# Patient Record
Sex: Male | Born: 1935
Health system: Southern US, Community
[De-identification: ages and names within clinical notes are randomized; demographics above are authoritative.]

## PROBLEM LIST (undated history)

## (undated) DIAGNOSIS — C801 Malignant (primary) neoplasm, unspecified: Secondary | ICD-10-CM

## (undated) DIAGNOSIS — K219 Gastro-esophageal reflux disease without esophagitis: Secondary | ICD-10-CM

## (undated) DIAGNOSIS — I4891 Unspecified atrial fibrillation: Secondary | ICD-10-CM

## (undated) DIAGNOSIS — Z8546 Personal history of malignant neoplasm of prostate: Secondary | ICD-10-CM

## (undated) DIAGNOSIS — R634 Abnormal weight loss: Secondary | ICD-10-CM

## (undated) DIAGNOSIS — I2510 Atherosclerotic heart disease of native coronary artery without angina pectoris: Secondary | ICD-10-CM

## (undated) DIAGNOSIS — E782 Mixed hyperlipidemia: Secondary | ICD-10-CM

## (undated) DIAGNOSIS — R066 Hiccough: Secondary | ICD-10-CM

## (undated) HISTORY — DX: Personal history of malignant neoplasm of prostate: Z85.46

## (undated) HISTORY — DX: Mixed hyperlipidemia: E78.2

## (undated) HISTORY — DX: Hiccough: R06.6

## (undated) HISTORY — PX: ESOPHAGOGASTRODUODENOSCOPY: SHX1529

## (undated) HISTORY — DX: Gastro-esophageal reflux disease without esophagitis: K21.9

## (undated) HISTORY — DX: Abnormal weight loss: R63.4

## (undated) HISTORY — DX: Unspecified atrial fibrillation: I48.91

## (undated) HISTORY — DX: Atherosclerotic heart disease of native coronary artery without angina pectoris: I25.10

## (undated) HISTORY — PX: OTHER SURGICAL HISTORY: SHX169

---

## 1994-10-29 HISTORY — PX: CORONARY ARTERY BYPASS GRAFT: SHX141

## 2003-01-01 ENCOUNTER — Other Ambulatory Visit: Admission: RE | Admit: 2003-01-01 | Discharge: 2003-01-01 | Payer: Self-pay | Admitting: Urology

## 2003-05-03 ENCOUNTER — Ambulatory Visit (HOSPITAL_COMMUNITY): Admission: RE | Admit: 2003-05-03 | Discharge: 2003-05-03 | Payer: Self-pay | Admitting: Family Medicine

## 2003-05-03 ENCOUNTER — Encounter: Payer: Self-pay | Admitting: Family Medicine

## 2003-05-20 ENCOUNTER — Ambulatory Visit (HOSPITAL_COMMUNITY): Admission: RE | Admit: 2003-05-20 | Discharge: 2003-05-20 | Payer: Self-pay | Admitting: Family Medicine

## 2003-05-20 ENCOUNTER — Encounter: Payer: Self-pay | Admitting: Family Medicine

## 2003-09-20 ENCOUNTER — Other Ambulatory Visit: Admission: RE | Admit: 2003-09-20 | Discharge: 2003-09-20 | Payer: Self-pay | Admitting: Urology

## 2003-10-21 ENCOUNTER — Ambulatory Visit (HOSPITAL_COMMUNITY): Admission: RE | Admit: 2003-10-21 | Discharge: 2003-10-21 | Payer: Self-pay | Admitting: Internal Medicine

## 2004-02-10 ENCOUNTER — Ambulatory Visit (HOSPITAL_COMMUNITY): Admission: RE | Admit: 2004-02-10 | Discharge: 2004-02-10 | Payer: Self-pay | Admitting: Family Medicine

## 2004-05-08 ENCOUNTER — Ambulatory Visit (HOSPITAL_COMMUNITY): Admission: RE | Admit: 2004-05-08 | Discharge: 2004-05-08 | Payer: Self-pay | Admitting: Family Medicine

## 2004-06-05 ENCOUNTER — Ambulatory Visit (HOSPITAL_COMMUNITY): Admission: RE | Admit: 2004-06-05 | Discharge: 2004-06-05 | Payer: Self-pay | Admitting: Internal Medicine

## 2004-11-03 ENCOUNTER — Ambulatory Visit (HOSPITAL_COMMUNITY): Admission: RE | Admit: 2004-11-03 | Discharge: 2004-11-03 | Payer: Self-pay | Admitting: Family Medicine

## 2005-01-18 ENCOUNTER — Ambulatory Visit (HOSPITAL_COMMUNITY): Admission: RE | Admit: 2005-01-18 | Discharge: 2005-01-18 | Payer: Self-pay | Admitting: Family Medicine

## 2005-05-23 ENCOUNTER — Ambulatory Visit (HOSPITAL_COMMUNITY): Admission: RE | Admit: 2005-05-23 | Discharge: 2005-05-23 | Payer: Self-pay | Admitting: Family Medicine

## 2005-06-12 ENCOUNTER — Ambulatory Visit: Payer: Self-pay | Admitting: Internal Medicine

## 2005-07-03 ENCOUNTER — Other Ambulatory Visit: Admission: RE | Admit: 2005-07-03 | Discharge: 2005-07-03 | Payer: Self-pay | Admitting: Urology

## 2005-09-05 ENCOUNTER — Encounter (HOSPITAL_COMMUNITY): Admission: RE | Admit: 2005-09-05 | Discharge: 2005-10-05 | Payer: Self-pay | Admitting: *Deleted

## 2005-09-11 ENCOUNTER — Ambulatory Visit (HOSPITAL_COMMUNITY): Admission: RE | Admit: 2005-09-11 | Discharge: 2005-09-11 | Payer: Self-pay | Admitting: Radiation Oncology

## 2005-10-26 ENCOUNTER — Ambulatory Visit: Admission: RE | Admit: 2005-10-26 | Discharge: 2006-02-15 | Payer: Self-pay | Admitting: Radiation Oncology

## 2005-11-13 ENCOUNTER — Ambulatory Visit (HOSPITAL_COMMUNITY): Admission: RE | Admit: 2005-11-13 | Discharge: 2005-11-13 | Payer: Self-pay | Admitting: Family Medicine

## 2006-04-03 ENCOUNTER — Ambulatory Visit: Payer: Self-pay | Admitting: Cardiology

## 2006-04-05 ENCOUNTER — Ambulatory Visit: Payer: Self-pay

## 2006-04-09 ENCOUNTER — Ambulatory Visit (HOSPITAL_COMMUNITY): Admission: RE | Admit: 2006-04-09 | Discharge: 2006-04-09 | Payer: Self-pay | Admitting: Ophthalmology

## 2006-04-19 ENCOUNTER — Ambulatory Visit: Payer: Self-pay | Admitting: Cardiology

## 2006-04-23 ENCOUNTER — Ambulatory Visit (HOSPITAL_COMMUNITY): Admission: RE | Admit: 2006-04-23 | Discharge: 2006-04-23 | Payer: Self-pay | Admitting: Ophthalmology

## 2006-05-13 ENCOUNTER — Ambulatory Visit: Payer: Self-pay | Admitting: Internal Medicine

## 2006-05-30 ENCOUNTER — Ambulatory Visit: Payer: Self-pay

## 2006-05-30 ENCOUNTER — Encounter: Payer: Self-pay | Admitting: Cardiology

## 2006-06-14 ENCOUNTER — Ambulatory Visit: Payer: Self-pay | Admitting: Internal Medicine

## 2006-06-14 ENCOUNTER — Ambulatory Visit (HOSPITAL_COMMUNITY): Admission: RE | Admit: 2006-06-14 | Discharge: 2006-06-14 | Payer: Self-pay | Admitting: Internal Medicine

## 2006-07-11 ENCOUNTER — Ambulatory Visit: Payer: Self-pay | Admitting: Internal Medicine

## 2006-11-20 ENCOUNTER — Ambulatory Visit (HOSPITAL_COMMUNITY): Admission: RE | Admit: 2006-11-20 | Discharge: 2006-11-20 | Payer: Self-pay | Admitting: Family Medicine

## 2006-12-23 ENCOUNTER — Ambulatory Visit (HOSPITAL_COMMUNITY): Admission: RE | Admit: 2006-12-23 | Discharge: 2006-12-23 | Payer: Self-pay | Admitting: Family Medicine

## 2007-01-30 ENCOUNTER — Ambulatory Visit: Payer: Self-pay | Admitting: Internal Medicine

## 2007-03-04 ENCOUNTER — Ambulatory Visit: Payer: Self-pay | Admitting: Cardiology

## 2007-03-10 ENCOUNTER — Ambulatory Visit (HOSPITAL_COMMUNITY): Admission: RE | Admit: 2007-03-10 | Discharge: 2007-03-10 | Payer: Self-pay | Admitting: Cardiology

## 2007-03-14 ENCOUNTER — Inpatient Hospital Stay (HOSPITAL_BASED_OUTPATIENT_CLINIC_OR_DEPARTMENT_OTHER): Admission: RE | Admit: 2007-03-14 | Discharge: 2007-03-14 | Payer: Self-pay | Admitting: Cardiology

## 2007-03-14 ENCOUNTER — Ambulatory Visit: Payer: Self-pay | Admitting: Cardiology

## 2007-03-26 ENCOUNTER — Ambulatory Visit: Payer: Self-pay | Admitting: Cardiology

## 2007-04-08 ENCOUNTER — Ambulatory Visit (HOSPITAL_COMMUNITY): Admission: RE | Admit: 2007-04-08 | Discharge: 2007-04-08 | Payer: Self-pay | Admitting: Ophthalmology

## 2007-04-22 ENCOUNTER — Ambulatory Visit (HOSPITAL_COMMUNITY): Admission: RE | Admit: 2007-04-22 | Discharge: 2007-04-22 | Payer: Self-pay | Admitting: Ophthalmology

## 2007-06-12 ENCOUNTER — Ambulatory Visit: Payer: Self-pay | Admitting: Cardiology

## 2007-12-02 ENCOUNTER — Ambulatory Visit: Payer: Self-pay | Admitting: Cardiology

## 2008-03-01 ENCOUNTER — Ambulatory Visit: Payer: Self-pay

## 2008-03-01 ENCOUNTER — Encounter: Payer: Self-pay | Admitting: Cardiology

## 2008-06-08 ENCOUNTER — Ambulatory Visit (HOSPITAL_COMMUNITY): Admission: RE | Admit: 2008-06-08 | Discharge: 2008-06-08 | Payer: Self-pay | Admitting: Family Medicine

## 2008-06-22 ENCOUNTER — Ambulatory Visit: Payer: Self-pay | Admitting: Cardiology

## 2008-06-24 ENCOUNTER — Ambulatory Visit (HOSPITAL_COMMUNITY): Admission: RE | Admit: 2008-06-24 | Discharge: 2008-06-24 | Payer: Self-pay | Admitting: Family Medicine

## 2008-06-30 ENCOUNTER — Ambulatory Visit: Payer: Self-pay

## 2008-12-20 ENCOUNTER — Ambulatory Visit: Payer: Self-pay | Admitting: Cardiology

## 2009-07-20 DIAGNOSIS — K219 Gastro-esophageal reflux disease without esophagitis: Secondary | ICD-10-CM | POA: Insufficient documentation

## 2009-07-20 DIAGNOSIS — I2510 Atherosclerotic heart disease of native coronary artery without angina pectoris: Secondary | ICD-10-CM | POA: Insufficient documentation

## 2009-07-20 DIAGNOSIS — Z8546 Personal history of malignant neoplasm of prostate: Secondary | ICD-10-CM

## 2009-07-20 DIAGNOSIS — D649 Anemia, unspecified: Secondary | ICD-10-CM | POA: Insufficient documentation

## 2009-07-20 DIAGNOSIS — E782 Mixed hyperlipidemia: Secondary | ICD-10-CM

## 2009-07-20 DIAGNOSIS — R42 Dizziness and giddiness: Secondary | ICD-10-CM | POA: Insufficient documentation

## 2009-07-20 DIAGNOSIS — I4891 Unspecified atrial fibrillation: Secondary | ICD-10-CM | POA: Insufficient documentation

## 2009-07-27 ENCOUNTER — Ambulatory Visit: Payer: Self-pay | Admitting: Cardiology

## 2010-02-06 ENCOUNTER — Ambulatory Visit: Payer: Self-pay | Admitting: Cardiology

## 2010-02-09 ENCOUNTER — Telehealth: Payer: Self-pay | Admitting: Cardiology

## 2010-04-07 ENCOUNTER — Ambulatory Visit (HOSPITAL_COMMUNITY): Admission: RE | Admit: 2010-04-07 | Discharge: 2010-04-07 | Payer: Self-pay | Admitting: Family Medicine

## 2010-04-19 ENCOUNTER — Encounter: Admission: RE | Admit: 2010-04-19 | Discharge: 2010-04-19 | Payer: Self-pay | Admitting: Family Medicine

## 2010-05-09 ENCOUNTER — Ambulatory Visit: Payer: Self-pay | Admitting: Cardiology

## 2010-05-09 DIAGNOSIS — I2510 Atherosclerotic heart disease of native coronary artery without angina pectoris: Secondary | ICD-10-CM

## 2010-06-29 HISTORY — PX: UPPER GASTROINTESTINAL ENDOSCOPY: SHX188

## 2010-06-29 HISTORY — PX: COLONOSCOPY: SHX174

## 2010-10-04 ENCOUNTER — Ambulatory Visit: Payer: Self-pay | Admitting: Internal Medicine

## 2010-11-03 ENCOUNTER — Ambulatory Visit: Admit: 2010-11-03 | Payer: Self-pay | Admitting: Cardiology

## 2010-11-13 ENCOUNTER — Ambulatory Visit
Admission: RE | Admit: 2010-11-13 | Discharge: 2010-11-13 | Payer: Self-pay | Source: Home / Self Care | Attending: Internal Medicine | Admitting: Internal Medicine

## 2010-11-13 LAB — HEPATIC FUNCTION PANEL
ALT: 16 U/L (ref 10–40)
AST: 19 U/L (ref 14–40)
Bilirubin, Total: 0.6 mg/dL

## 2010-11-13 LAB — CBC AND DIFFERENTIAL
HCT: 41 % (ref 41–53)
Platelets: 193 10*3/uL (ref 150–399)

## 2010-11-30 NOTE — Assessment & Plan Note (Signed)
Summary: Sergio Boyle   Visit Type:  6 months follow up  CC:  Weakness.  History of Present Illness: He thinks he is getting weaker.  Not enough strength in the legs.  He feels like he wants to lay down and go to sleep.   He feels like his head in the fog, at times.    Current Medications (verified): 1)  Coumadin 5 Mg Tabs (Warfarin Sodium) .... Take As Directed 2)  Lipitor 20 Mg Tabs (Atorvastatin Calcium) .... Take One Tablet By Mouth Daily. 3)  Xanax 0.5 Mg Tabs (Alprazolam) .... Take 1 Tablet By Mouth Two Times A Day 4)  Nitrostat 0.4 Mg Subl (Nitroglycerin) .... Take As Directed For Chest Pain 5)  Vicodin 5-500 Mg Tabs (Hydrocodone-Acetaminophen) .... As Needed 6)  Prilosec Otc 20 Mg Tbec (Omeprazole Magnesium) .... Take 1 Tablet By Mouth Once A Day 7)  Cardizem 60 Mg Tabs (Diltiazem Hcl) .... Take 1 Tablet By Mouth Once A Day  Allergies: 1)  ! Sulfa  Vital Signs:  Patient profile:   77 year old male Height:      69 inches Weight:      176 pounds BMI:     26.08 Pulse rate:   105 / minute Pulse rhythm:   irregular Resp:     18 per minute BP sitting:   118 / 70  (left arm) Cuff size:   regular  Vitals Entered By: Vikki Ports (February 06, 2010 3:13 PM)  Physical Exam  General:  Well developed, well nourished, in no acute distress. Head:  normocephalic and atraumatic Eyes:  PERRLA/EOM intact; conjunctiva and lids normal. Lungs:  Clear bilaterally to auscultation and percussion. Heart:  Irregularly, irregular.   Abdomen:  Bowel sounds positive; abdomen soft and non-tender without masses, organomegaly, or hernias noted. No hepatosplenomegaly. Pulses:  pulses normal in all 4 extremities Neurologic:  Alert and oriented x 3.  Non focal.   EKG  Procedure date:  02/06/2010  Findings:      irregularly, irregular rate 95, RBBB.  Impression & Recommendations:  Problem # 1:  ATRIAL FIBRILLATION, CHRONIC (ICD-427.31) Persistent.  Rate is marginally controlled, but rate go to  slow on doses of diltiazem..  Might be better with 120mg  daily.  He will think about it.  Has also been stable on warfarin, with CHADS of 1, but MRI is as noted.  Might consider alternative.   His updated medication list for this problem includes:    Coumadin 5 Mg Tabs (Warfarin sodium) .Marland Kitchen... Take as directed  Problem # 2:  CAD (ICD-414.00)  stable His updated medication list for this problem includes:    Coumadin 5 Mg Tabs (Warfarin sodium) .Marland Kitchen... Take as directed    Nitrostat 0.4 Mg Subl (Nitroglycerin) .Marland Kitchen... Take as directed for chest pain    Dilt-cd 120 Mg Xr24h-cap (Diltiazem hcl coated beads) .Marland Kitchen... Take one capsule daily  Orders: EKG w/ Interpretation (93000)  Problem # 3:  HYPERCHOLESTEROLEMIA (ICD-272.0) tolerating well. His updated medication list for this problem includes:    Lipitor 20 Mg Tabs (Atorvastatin calcium) .Marland Kitchen... Take one tablet by mouth daily.  Problem # 4:  DIZZINESS (ICD-780.4) chronic issue.  See MRI, warfarin discussed favor 2-2.5 range.  Patient Instructions: 1)  Your physician recommends that you schedule a follow-up appointment in: 3 months 2)  Your physician has recommended you make the following change in your medication: Diltiazem 120mg  one capsule daily. Prescriptions: DILT-CD 120 MG XR24H-CAP (DILTIAZEM HCL COATED BEADS) Take one capsule daily  #  30 x 6   Entered by:   Lisabeth Devoid RN   Authorized by:   Ronaldo Miyamoto, MD, [facility]   Signed by:   Lisabeth Devoid RN on 02/06/2010   Method used:   Electronically to        [facility] (retail)       [address]       [place], [place]  [address]       Ph: [phone] or [phone]       Fax: [phone]   RxID:   [id number]

## 2010-11-30 NOTE — Assessment & Plan Note (Signed)
Summary: f23m   Visit Type:  3 months follow up  CC:  irregular heart beat.  History of Present Illness: Stable overall.  Does not do much.  Dr. Renard Matter has encouraged him to see a neurologist.  Tolerates the dig better than he does beta blocker.  Still has lightheadedness.  No chest pain.   Current Medications (verified): 1)  Coumadin 5 Mg Tabs (Warfarin Sodium) .... Take As Directed 2)  Lipitor 20 Mg Tabs (Atorvastatin Calcium) .... Take One Tablet By Mouth Daily. 3)  Xanax 0.5 Mg Tabs (Alprazolam) .... Take 1 Tablet By Mouth Two Times A Day 4)  Nitrostat 0.4 Mg Subl (Nitroglycerin) .... Take As Directed For Chest Pain 5)  Vicodin 5-500 Mg Tabs (Hydrocodone-Acetaminophen) .... As Needed 6)  Protonix 40 Mg Tbec (Pantoprazole Sodium) .... Take 1 Tablet By Mouth Once A Day 7)  Digoxin 0.125 Mg Tabs (Digoxin) .... Take One Tablet By Mouth Daily  Allergies: 1)  ! Sulfa  Vital Signs:  Patient profile:   77 year old male Height:      69 inches Weight:      170.50 pounds BMI:     25.27 Pulse rate:   74 / minute Pulse rhythm:   irregular Resp:     18 per minute BP sitting:   100 / 58  (left arm) Cuff size:   regular  Vitals Entered By: Vikki Ports (May 09, 2010 3:29 PM)  Physical Exam  General:  Well developed, well nourished, in no acute distress. Head:  normocephalic and atraumatic Eyes:  PERRLA/EOM intact; conjunctiva and lids normal. Lungs:  Clear bilaterally to auscultation and percussion. Heart:  PMI non displaced.  Irregularly irregular rhythm, not too fast. No definite murmur.  Abdomen:  Bowel sounds positive; abdomen soft and non-tender without masses, organomegaly, or hernias noted. No hepatosplenomegaly. Extremities:  No clubbing or cyanosis.  No edema.    EKG  Procedure date:  05/09/2010  Findings:      atrial fib, controlled vent response.   EKG  Procedure date:  05/09/2010  Findings:      atrial fib. RBBB.  Controlled vent response.   Impression &  Recommendations:  Problem # 1:  ATRIAL FIBRILLATION, CHRONIC (ICD-427.31)  Rate is reasonable on dig alone,which he tolerates better than Calcium blockers.  Does not do much.  Symptoms, however, are controlled.   ChadsVASC2 score is 3, with estimated stroke rate of 3.2%.  Has abnormal MRI, with small hemorrhage, old and stable.  Favor continuiung.  His updated medication list for this problem includes:    Coumadin 5 Mg Tabs (Warfarin sodium) .Marland Kitchen... Take as directed    Digoxin 0.125 Mg Tabs (Digoxin) .Marland Kitchen... Take one tablet by mouth daily  His updated medication list for this problem includes:    Coumadin 5 Mg Tabs (Warfarin sodium) .Marland Kitchen... Take as directed    Digoxin 0.125 Mg Tabs (Digoxin) .Marland Kitchen... Take one tablet by mouth daily  Orders: EKG w/ Interpretation (93000)  Problem # 2:  CAD, ARTERY BYPASS GRAFT (ICD-414.04)  reamains without chest pain, and stable status.   The following medications were removed from the medication list:    Diltiazem Hcl Cr 60 Mg Xr12h-cap (Diltiazem hcl) .Marland Kitchen... Take one capsule daily His updated medication list for this problem includes:    Coumadin 5 Mg Tabs (Warfarin sodium) .Marland Kitchen... Take as directed    Nitrostat 0.4 Mg Subl (Nitroglycerin) .Marland Kitchen... Take as directed for chest pain  The following medications were removed from the medication  list:    Diltiazem Hcl Cr 60 Mg Xr12h-cap (Diltiazem hcl) .Marland Kitchen... Take one capsule daily His updated medication list for this problem includes:    Coumadin 5 Mg Tabs (Warfarin sodium) .Marland Kitchen... Take as directed    Nitrostat 0.4 Mg Subl (Nitroglycerin) .Marland Kitchen... Take as directed for chest pain  Problem # 3:  HYPERCHOLESTEROLEMIA (ICD-272.0)  Managed by Dr. Renard Matter.  Continue atorvastatin. His updated medication list for this problem includes:    Lipitor 20 Mg Tabs (Atorvastatin calcium) .Marland Kitchen... Take one tablet by mouth daily.  His updated medication list for this problem includes:    Lipitor 20 Mg Tabs (Atorvastatin calcium) .Marland Kitchen... Take  one tablet by mouth daily.  Orders: EKG w/ Interpretation (93000)  Patient Instructions: 1)  Your physician wants you to follow-up in:   6 MONTHS. You will receive a reminder letter in the mail two months in advance. If you don't receive a letter, please call our office to schedule the follow-up appointment. 2)  Your physician recommends that you continue on your current medications as directed. Please refer to the Current Medication list given to you today.

## 2010-11-30 NOTE — Progress Notes (Signed)
Summary: meds/low bp  Phone Note Call from Patient Call back at Home Phone [phone]   Caller: Patient Reason for Call: Talk to Nurse Summary of Call: request to speak to nurse about meds, bp is low 100/50 Initial call taken by: Migdalia Dk,  February 09, 2010 10:06 AM  Follow-up for Phone Call        I spoke with the pt and he took his first dose of Diltiazem 120mg  on Wednesday.  This dropped his BP to 90/40 and he did not feel well all day.  Today the pt opened the 120mg  capsule and took part of the beads.  The pt's pulse is in the 90s, BP 100/50.  I instructed the pt not to open the capsules.  I also instructed the pt to go back to his previous 60mg  dose.  I will review this information with Dr Riley Kill for further orders.  Follow-up by: Julieta Gutting, RN, BSN,  February 09, 2010 10:59 AM  Additional Follow-up for Phone Call Additional follow up Details #1::        Per Dr Riley Kill the pt can start Lanoxin 0.125mg  and continue Diltiazem 60mg  daily.  I will call the pt on 02/13/10. Julieta Gutting, RN, BSN  February 10, 2010 7:20 PM  I spoke with the pt and he said he has taken Lanoxin in the past and did not tolerate this medication due to stomach problems.  I will review this information with Dr Riley Kill.  Additional Follow-up by: Julieta Gutting, RN, BSN,  February 13, 2010 9:24 AM    Additional Follow-up for Phone Call Additional follow up Details #2::    The pt called back and said he has IBS that is treated with Xanax.  The pt said it has been over 20 years since he has taken Lanoxin and would like to try this medication again.  I will send a Rx to [facility]. Julieta Gutting, RN, BSN  February 13, 2010 10:14 AM  New/Updated Medications: DILTIAZEM HCL CR 60 MG XR12H-CAP (DILTIAZEM HCL) take one capsule daily DIGOXIN 0.125 MG TABS (DIGOXIN) take one tablet by mouth daily Prescriptions: DIGOXIN 0.125 MG TABS (DIGOXIN) take one tablet by mouth daily  #30 x 6   Entered by:   Julieta Gutting,  RN, BSN   Authorized by:   Ronaldo Miyamoto, MD, [facility]   Signed by:   Julieta Gutting, RN, BSN on 02/13/2010   Method used:   Electronically to        [facility] (retail)       [address]       [place], [place]  [address]       Ph: [phone] or [phone]       Fax: [phone]   RxID:   [id number]

## 2010-12-26 ENCOUNTER — Encounter: Payer: Self-pay | Admitting: Cardiology

## 2010-12-26 ENCOUNTER — Ambulatory Visit (INDEPENDENT_AMBULATORY_CARE_PROVIDER_SITE_OTHER): Payer: Medicare Other | Admitting: Cardiology

## 2010-12-26 DIAGNOSIS — I25810 Atherosclerosis of coronary artery bypass graft(s) without angina pectoris: Secondary | ICD-10-CM

## 2010-12-26 DIAGNOSIS — E7800 Pure hypercholesterolemia, unspecified: Secondary | ICD-10-CM

## 2010-12-26 DIAGNOSIS — I4891 Unspecified atrial fibrillation: Secondary | ICD-10-CM

## 2010-12-27 ENCOUNTER — Encounter: Payer: Self-pay | Admitting: Cardiology

## 2011-01-04 NOTE — Assessment & Plan Note (Signed)
Summary: follow up   Visit Type:  Follow-up Primary Provider:  mcinnis  CC:  No complaints.  History of Present Illness: He is about the same.  There are many things which bother him.  He is dizzy at times.  His feet at night are numb at times.  He does not have alot of energy.  He denies chest pain.    Problems Prior to Update: 1)  Cad, Artery Bypass Graft  (ICD-414.04) 2)  Cad  (ICD-414.00) 3)  Atrial Fibrillation, Chronic  (ICD-427.31) 4)  Hypercholesterolemia  (ICD-272.0) 5)  Gerd  (ICD-530.81) 6)  Dizziness  (ICD-780.4) 7)  Prostate Cancer, Hx of  (ICD-V10.46) 8)  Anemia, Normocytic  (ICD-285.9)  Current Medications (verified): 1)  Coumadin 5 Mg Tabs (Warfarin Sodium) .... Take As Directed 2)  Lipitor 20 Mg Tabs (Atorvastatin Calcium) .... Take One Tablet By Mouth Daily. 3)  Xanax 0.5 Mg Tabs (Alprazolam) .... Take 1 Tablet By Mouth Two Times A Day 4)  Nitrostat 0.4 Mg Subl (Nitroglycerin) .... Take As Directed For Chest Pain 5)  Vicodin 5-500 Mg Tabs (Hydrocodone-Acetaminophen) .... As Needed 6)  Protonix 40 Mg Tbec (Pantoprazole Sodium) .... Take 1 Tablet By Mouth Two Times A Day 7)  Digoxin 0.125 Mg Tabs (Digoxin) .... Take One Tablet By Mouth Daily 8)  Proscar 5 Mg Tabs (Finasteride) .... Take 1 Tablet By Mouth Once A Day  Allergies: 1)  ! Sulfa  Vital Signs:  Patient profile:   77 year old male Height:      69 inches Weight:      165 pounds BMI:     24.45 Pulse rate:   51 / minute Pulse rhythm:   irregular Resp:     18 per minute BP sitting:   124 / 60  (left arm) Cuff size:   large  Vitals Entered By: Vikki Ports (December 26, 2010 3:28 PM)  Physical Exam  General:  older gentleman in no distress.   Head:  normocephalic and atraumatic Eyes:  PERRLA/EOM intact; conjunctiva and lids normal. Lungs:  Clear bilaterally to auscultation and percussion. Heart:  Irregularly, irregular without significant murmur. Pulses:  pulses normal in all 4  extremities Extremities:  No clubbing or cyanosis. Neurologic:  Alert and oriented x 3. fr  EKG  Procedure date:  12/26/2010  Findings:      Atrial fibrillation with slower VR.  RBBB.  Impression & Recommendations:  Problem # 1:  CAD, ARTERY BYPASS GRAFT (ICD-414.04) stable His updated medication list for this problem includes:    Coumadin 5 Mg Tabs (Warfarin sodium) .Marland Kitchen... Take as directed    Nitrostat 0.4 Mg Subl (Nitroglycerin) .Marland Kitchen... Take as directed for chest pain  Orders: EKG w/ Interpretation (93000)  Problem # 2:  ATRIAL FIBRILLATION, CHRONIC (ICD-427.31) rate is controlled.  Continue current medications.  No change in CNS status. His updated medication list for this problem includes:    Coumadin 5 Mg Tabs (Warfarin sodium) .Marland Kitchen... Take as directed    Digoxin 0.125 Mg Tabs (Digoxin) .Marland Kitchen... Take one tablet by mouth daily  Orders: EKG w/ Interpretation (93000)  Problem # 3:  HYPERCHOLESTEROLEMIA (ICD-272.0) followed by Dr. Renard Matter.   His updated medication list for this problem includes:    Lipitor 20 Mg Tabs (Atorvastatin calcium) .Marland Kitchen... Take one tablet by mouth daily.  Patient Instructions: 1)  Your physician recommends that you continue on your current medications as directed. Please refer to the Current Medication list given to you today. 2)  Your physician wants you to follow-up in:  6 MONTHS.  You will receive a reminder letter in the mail two months in advance. If you don't receive a letter, please call our office to schedule the follow-up appointment.

## 2011-03-13 NOTE — Assessment & Plan Note (Signed)
[facility]                            CARDIOLOGY OFFICE NOTE   KELDEN, LAVALLEE                          MRN:          [id number]  DATE:06/22/2008                            DOB:          [birth year]    Mr. Fullilove is in for followup visit.  In general, he has been stable.  He had some cervical spine films and he does have an MRI pending.  After  discussion with Dr. Renard Matter clinically, however, from a cardiac  standpoint, he is stable.  The patient has coronary artery disease and  has previously undergone an emergent bypass operation.  He also has  persistent atrial fibrillation on Coumadin anticoagulation with  reasonable rate control.  He has had a mild anemia in the past, and this  has been noted with a normal MCV.   CURRENT MEDICATIONS:  1. Calcium D 600 mg b.i.d.  2. Coumadin as directed.  3. Lipitor 20 mg daily.  4. Cardizem CD 120.  5. Xanax 0.45 b.i.d.  6. Pepcid 20 mg p.r.n.  7. Protonix 40 mg p.r.n.   PHYSICAL EXAMINATION:  VITAL SIGNS:  Blood pressure is 126/64, the pulse  is 69 and somewhat irregularly regular.  LUNGS:  Lung fields are clear to auscultation.  CARDIAC:  Rhythm is irregularly regular without a definite murmur.   Electrocardiogram demonstrates atrial fibrillation.  There is right  bundle-branch block.  Compared to prior tracings, no significant  interval changes noted since February 2009.   IMPRESSION:  1. Coronary artery disease with prior emergency bypass as well as      stenting.  2. Persistent atrial fibrillation on Coumadin anticoagulation.  3. Chronic history of lightheadedness.     Arturo Morton. Riley Kill, MD, [facility]  Electronically Signed    TDS/MedQ  DD: 12/07/2008  DT: 12/07/2008  Job #: [id number]

## 2011-03-13 NOTE — Assessment & Plan Note (Signed)
[facility]                            CARDIOLOGY OFFICE NOTE   Sergio Boyle, Sergio Boyle                        MRN:          [id number]  DATE:03/26/2007                            DOB:          June 04, 1928    Sergio Boyle is in followup. He overall is stable. He underwent cardiac  catheterization, this demonstrated patency of the vein graft to the  diagonal with an occluded diagonal stent, internal mammary to the LAD  appeared to be atretic, although the stent to the LAD appeared to be  widely patent. His RCA was also widely patent and there was critical  circumflex disease. Following discharge from his study, he did have some  discomfort in the mid epigastrium but no associated shortness of breath  or other major symptoms, this eased off in about 3 to 4 hours. He has  been taking his blood pressure and his heart rate and they have both  been under reasonably good control. His wife did a hemoglobin and wanted  to know with the slightly lower hemoglobin whether he could take some  iron, although his MCV is 91 and I told her that I thought that this  should be determined by determining the cause of anemia, and that they  should discuss this with Dr. Renard Matter. I told her that it was unlikely  that he would benefit from iron with an MCV of 91. Finally, he has had a  MRI of the brain and does have intermittent feelings of dizziness and  not feeling quite right. He has had a mass effect on MRI but this has  been followed up for nearly 2 years and I have discussed this with Dr.  Renard Matter.   Medications include;  1. Xanax 0.25 mg 1 to 2 day.  2. Pepcid 20 mg daily.  3. Prevacid 30 mg p.r.n.  4. Calcium daily.  5. Coumadin as directed.  6. Lipitor 20 mg daily.  7. Eligard injections for prostate cancer every 3 months.  8. Cardizem CD 120 mg daily.    He is not taking beta blockers because of side effects and he is not  taking antiplatelet agents because of chronic  Coumadin therapy.   On his physical examination, the blood pressure is 114/69, the pulse is  75 and irregularly irregular.  LUNG FIELDS: Clear. There are no carotid bruits noted.  The cardiac rhythm is irregularly irregular.   The electrocardiogram demonstrates atrial fibrillation with controlled  ventricular response, and right bundle branch block.   IMPRESSION:  1. Coronary artery disease with prior stenting of the LAD and diagonal      and emergency bypass with patent vein graft to the diagonal as      noted above, and patent stent to the LAD as noted in the above      text.  2. Persistent atrial fibrillation on Coumadin anticoagulation with      reasonable rate control on diltiazem.  3. Intermittent dizziness with stable blood pressure and pulse at the      time of symptoms as noted by his wife.  4.  Known brain mass which was stable in follow up in 2007 CT scan.  5. History of prostate cancer.  6. Mild normocytic anemia.   PLAN:  1. Return to clinic in 3 months.  2. Encouraged follow up with Dr. Renard Matter on a regular basis.     Arturo Morton. Riley Kill, MD, [facility]  Electronically Signed    TDS/MedQ  DD: 03/26/2007  DT: 03/26/2007  Job #: [id number]

## 2011-03-13 NOTE — Cardiovascular Report (Signed)
NAMEGENEVA, PALLAS NO.:  [id number]   MEDICAL RECORD NO.:  [id number]          PATIENT TYPE:  OIB   LOCATION:  1967                         FACILITY:  [facility]   PHYSICIAN:  Arturo Morton. Riley Kill, MD, FACCDATE OF BIRTH:  01-28-1928   DATE OF PROCEDURE:  03/14/2007  DATE OF DISCHARGE:                            CARDIAC CATHETERIZATION   [name] is well-known to [place].  He is 77 years old.  He previously  underwent bifurcation stenting at [facility], and at that time had an  urgent bypass operation to the diagonal.  The current study was done to  assess coronary anatomy.   He has had some increasing fatigue and decreasing exercise tolerance.  He has had some mild chest discomfort.  He had a negative radionuclide  imaging study a year ago.  The patient is on chronic Coumadin for atrial  fibrillation.  We have recently started him on some better rate control.   PROCEDURES:  1. Left heart catheterization  2. Selective coronary arteriography.  3. Selective left ventriculography.  4. Saphenous vein graft angiography.  5. Aortic root aortography.   DESCRIPTION OF PROCEDURE:  The patient was brought to the cath lab,  prepped and draped in the usual fashion.  Through an anterior puncture,  the right femoral artery was easily entered and a 4-French sheath was  placed.  We then used a JR-4 4-French catheter to inject the saphenous  vein graft.  We also injected the right coronary artery.  He tolerated  both of these without difficulty.  Following this, a view of the  subclavian and internal mammary were also obtained.  Following this,  views of the left coronaries were obtained.  Following this, central  aortic and left ventricular pressures were measured with a pigtail.  Ventriculography was performed in the RAO projection.  Following a  pressure pullback, we did aortic root aortography because of the mild  aortic regurgitation by echocardiography.  He tolerated all this  well,  and then I reviewed the films with his wife.  There were no  complications.   HEMODYNAMIC DATA:  1. Central aortic pressure 120/67, mean 92.  2. Left ventricular pressure 121/4.  3. No gradient pullback across aortic valve.   ANGIOGRAPHIC DATA:  1. Left ventriculography revealed vigorous global systolic function      without segmental wall motion abnormality.  There appear to be mild      mitral regurgitation at most.  2. Aortic root aortography revealed what appeared to be a normal      aortic root size.  It did not appear to be significantly enlarged.      There did not appear to be significant aortic regurgitation.  There      also appeared to be only one graft.  3. The left main is free of critical disease.  4. The LAD courses to the apex.  There is about 30% narrowing at the      previous site of stenting.  The stent involves the LAD and it does      not appear to  demonstrate high-grade restenosis.  There is an      additional stent that goes out to a diagonal.  5. The diagonal is supplied by a vein graft which appears to be widely      patent without significant focal obstruction.  6. The circumflex provides three marginals and posterolateral branch.      It is a large-caliber vessel and smooth and free of critical      disease.  7. The right coronary artery is widely patent and provides a large      posterior descending branch without significant narrowing.  8. There is flow down the internal mammary, although the mammary      itself just trials off and ends.  There are staples that go up on      the anterior chest   CONCLUSIONS:  1. Preserved left ventricular function.  2. Mild mitral regurgitation.  3. No evidence of aortic dissection and no significant aortic      regurgitation.  4. Continued patency of the left anterior descending artery stent.  5. Occlusion of the diagonal stent.  6. Continued patency of the saphenous vein graft to the diagonal.  7. Widely  patent right coronary artery and circumflex arteries.   DISPOSITION:  We may attempt to increase the rate control with his  medications.  If he has significant bradycardia, he may then require  underlying pacing.  Followup will be with Dr. Riley Kill.      Arturo Morton. Riley Kill, MD, [facility]     TDS/MEDQ  D:  03/14/2007  T:  03/14/2007  Job:  [id number]   cc:   Angus G. Renard Matter, MD  CV Laboratory

## 2011-03-13 NOTE — Assessment & Plan Note (Signed)
[facility]ALTHCARE                            CARDIOLOGY OFFICE NOTE   KRISHANG, READING                        MRN:          [id number]  DATE:03/04/2007                            DOB:          April 28, 1928    Sergio Boyle is in for a followup.  Since I last saw him, he says that he  thinks he is falling apart.  In general, he has been stable, but he  notices that he gets a little bit of chest discomfort.  The patient  underwent bypass surgery back in 1996 after a failed intervention at [place]  in [place].  He has been in atrial fibrillation, and has been on chronic  Coumadin anticoagulation.  He has had his Toprol reduced with the idea  that the low blood pressure may be causing some of his symptoms.  His  wife is a Engineer, civil (consulting) from Dr. Renard Matter' office, and he has been on a stable  anticoagulation regimen for some time.  His heart rate is up to 100 at  times, and he is not a candidate for Lanoxin.   Today on examination the blood pressure was 128/76.  The pulse is 73 and  irregularly irregular.  LUNG FIELDS:  Actually are clear.  There are no carotid bruits.  I did not appreciate a significant murmur.   His EKG reveals atrial fibrillation with controlled ventricular  response.  There is T wave inversion noted along side of right bundle  branch block.   His most recent laboratory studies include a normal BUN and creatinine,  and a hemoglobin of 12.1.  His LDL cholesterol is 72.  His T4 and TSH  are within the normal ranges, and his PSA was normal.   The patient underwent radionuclide imaging nearly a year ago.  At that  time, there was no definite myocardial perfusion defect.  His  echocardiogram was slightly abnormal with some mild mitral  regurgitation, and also enlargement of the RV and NRA, and moderate  tricuspid regurgitation.   I have had an extensive discussion with Sergio Boyle and his wife.  We  talked about various options.  The patient clearly is  concerned about  progression in symptoms.  At this time, I think it would be most  appropriate to consider cardiac catheterization.  He and his wife will  discuss this.  We are going to go ahead and stop his Toprol, and I have  started him on Cardizem CD 120 mg.  I told him this would probably  provide rate control without a significant drop in his blood pressure.  The patient does desire to have an additional MRI scan, but certainly I  will leave this up to Dr. Renard Matter.  We will see the patient possibly for  a cardiac catheterization next week, but he will discuss this with his  wife first.    Sergio Morton. Riley Kill, MD, [facility]  Electronically Signed   TDS/MedQ  DD: 03/04/2007  DT: 03/04/2007  Job #: [id number]   cc:   Angus G. Renard Matter, MD

## 2011-03-13 NOTE — Assessment & Plan Note (Signed)
[facility]ALTHCARE                            CARDIOLOGY OFFICE NOTE   JERAL, ZICK                        MRN:          [id number]  DATE:12/02/2007                            DOB:          1928-08-30    Mr. Correa is in for follow-up.  He really is doing quite well.  He has  not been watching his diet very well, but overall he has gotten along  just fine .  He has not had chest pain or significant shortness of  breath.  He has continued follow-up with Dr. Renard Matter.  He occasionally  has some dizziness.  He is on Coumadin anticoagulation and his  prothrombin times were followed in Dr. Renard Matter' office.   On physical the blood pressure was 125/75; the pulse is 76 and  irregularly irregular.  The lung fields are clear to auscultation and  percussion.  The cardiac rhythm is irregularly irregular.  There is not  a definite murmur.   EKG:  Reveals atrial fibrillation, with controlled ventricular response  and right bundle branch block.   IMPRESSION:  1. Coronary disease with prior stenting of the left anterior      descending and diagonal emergency bypass, with patent vein graft to      the diagonal and patent stent to the left anterior descending      artery.  2. Persistent atrial fibrillation, on Coumadin anticoagulation with      reasonable rate control.  3. Intermittent dizziness.  4. Noted brain mass with abnormal MRI in the past.  5. History of prostate cancer.  6. History of mild normocytic anemia.   PLAN:  1. Return to Clinic in 6 months.  2. At that time we will recommend a repeat echocardiogram (last done      in 2007).     Arturo Morton. Riley Kill, MD, [facility]  Electronically Signed    TDS/MedQ  DD: 12/02/2007  DT: 12/03/2007  Job #: [id number]

## 2011-03-13 NOTE — Letter (Signed)
December 20, 2008    Angus G. Renard Matter, MD  [address]  [place], [place] [address]   RE:  Sergio Boyle, Sergio Boyle  MRN:  [id number]  /  DOB:  02-Nov-1927   Dear Thalia Party,   I had the pleasure of seeing Sergio Boyle in the office today in  followup.  As you know, he is stable.  He has really not had much of a  change.  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  This has not changed rather dramatically and has been stable.  As  you know, he has persistent atrial fibrillation and has been on Coumadin  anticoagulation.  I do know that his Cardizem has also been dropped to  about 60 mg a day.  Other than this, he has gotten along reasonably  well.   Today, the blood pressure is 110/62, the pulse is 67 and irregularly  irregular.  Lung fields are clear.  The cardiac exam is unchanged.   His electrocardiogram demonstrates pulse is 62 and irregularly irregular  with atrial fib and right bundle branch block.   Sergio Boyle remains stable.  He has not had any ischemic symptoms, and  more importantly, his atrial fibrillation rate is well controlled.  Despite the findings on the MRI, he does have some risk for  thromboembolic events, and has been stable on this, and there has been  no obvious change.  His last carotids done here were unremarkable and  his vertebrals were open  bilaterally.  We will continue to see him back in followup in about 6  months.  For the present time, I probably would not make any changes in  his overall medical regimen.   Thanks for allowing me to share in his care.    Sincerely,      Arturo Morton. Riley Kill, MD, [facility]  Electronically Signed    TDS/MedQ  DD: 12/20/2008  DT: 12/21/2008  Job #: [id number]

## 2011-03-13 NOTE — Letter (Signed)
June 12, 2007    Angus G. Renard Matter, MD  [address]  [place], [place] [address]   RE:  Sergio Boyle, Sergio Boyle  MRN:  [id number]  /  DOB:  [birth year]   Dear Thalia Party:   I had the pleasure of seeing your nice patient, [name], in the  office today on a followup cardiac visit.  Since switching from  metoprolol to Cardizem, he feels like his heart rhythm has smoothed out  somewhat.  He seems to tolerate this better and has been in better  spirits.  There has been, perhaps, less fatigue and better exercise  tolerance overall.  As you know, he underwent cardiac catheterization,  which demonstrated patency of the vein graft to the diagonal, and non-  critical disease elsewhere.  Overall, he has done extremely well.   Today, on examination, his weight is 179.04 pounds, blood pressure  118/64, and the pulse is 68.  The lung fields are clear.  The cardiac rhythm is irregularly irregular, but without a definite  murmur.   In summary, this gentleman has coronary artery disease.  He has atrial  fibrillation with controlled ventricular response and is on Coumadin  anticoagulation, and therefore is not on aspirin at the present time.  I  know that he has had followup of his lipids in your office, and  continues to be  managed with Lipitor 20.  We will see him back in followup in 6 months,  but I would be happy to see him sooner if further problems were to  arise.   ADDENDUM:  EKG reveals atrial fibrillation with a controlled ventricular  response, nonspecific interventricular conduction delay with a right  bundle morphology.    Sincerely,      Arturo Morton. Riley Kill, MD, [facility]  Electronically Signed    TDS/MedQ  DD: 06/12/2007  DT: 06/13/2007  Job #: [id number]

## 2011-03-16 NOTE — Op Note (Signed)
NAME:  Sergio Boyle, Sergio Boyle                           ACCOUNT NO.:  [id number]   MEDICAL RECORD NO.:  [id number]                   PATIENT TYPE:  AMB   LOCATION:  DAY                                  FACILITY:  [facility]   PHYSICIAN:  Lionel December, M.D.                 DATE OF BIRTH:  06/24/1928   DATE OF PROCEDURE:  10/21/2003  DATE OF DISCHARGE:                                  PROCEDURE NOTE   PROCEDURE:  Esophagogastroduodenoscopy with esophageal dilatation.   INDICATIONS FOR PROCEDURE:  [name] is a 77 year old Caucasian male with  chronic GERD who has been maintained on PPI who is still having intermittent  symptoms as well as epigastric pain, and he also has had intermittent  dysphagia primarily with corn.  He is undergoing diagnostic EGD.  The  procedure risks were reviewed with the patient, and informed consent for the  procedure was obtained.   PREOPERATIVE MEDICATIONS:  Cetacaine spray for pharyngeal topical  anesthesia, Demerol 50 mg IV, Versed 5 mg IV.   FINDINGS:  The procedure was performed in the endoscopy suite.  The  patient's vital signs and O2 saturations were monitored during the procedure  and remained stable.  The patient was placed in the left lateral recumbent  position, and the Olympus videoscope was passed via the oropharynx without  any difficulty into the esophagus.   Esophagus:  Some difficulty noted in passing the scope across the ________  and cervical esophagus, suggestive of a web.  The mucosa, however, was  normal.  The squamocolumnar junction was unremarkable.  No hernia was noted.   Stomach:  It was empty and distended very well with insufflation.  The folds  of the proximal stomach were normal.  Examination of  the mucosa revealed  antral erythema and granularity at antrum, but no erosions or ulcers were  noted.  The pyloric channel was patent.  The angularis, fundus, and cardia  were examined by retroflexing the scope and were normal.   Duodenum:   Examination of the bulb and second part of the duodenum was  normal.  The endoscope was withdrawn.   The esophagus was dilated by passing the 56 French Maloney dilator through  the esophagus completely.  As the dilator was withdrawn, the endoscope was  passed again, and there was about a 2.5-cm boat-shaped linear tear in the  cervical esophagus and another smaller linear tear at distal esophagus  proximal to the GE junction.  The endoscope was withdrawn.   The patient tolerated the procedure well.   FINAL DIAGNOSES:  1. No endoscopic evidence of reflux esophagitis.  2. Esophageal cervical web and noncritical tubular narrowing to distal     esophagus.  56 French Maloney dilator resulted in tear of the cervical     esophagus and distal esophagus.  3. Nonerosive antral gastritis.  4. Esophagus dilated by passing 56 [place] Maloney dilator resulting in  mucosal tear at cervical and distal esophagus.   RECOMMENDATIONS:  1. H pylori serology will be checked today.  2. He will continue antireflux measures and Prevacid, and he can resume his     Coumadin this evening.      ___________________________________________                                            Lionel December, M.D.   NR/MEDQ  D:  10/21/2003  T:  10/21/2003  Job:  [id number]   cc:   Angus G. Renard Matter, M.D.  [address]  [place]  [place] [address]  Fax: [phone]

## 2011-03-16 NOTE — H&P (Signed)
NAME:  MARLON, SULEIMAN NO.:  [id number]   MEDICAL RECORD NO.:  [id number]                  PATIENT TYPE:   LOCATION:                                       FACILITY:   PHYSICIAN:  Lionel December, M.D.                 DATE OF BIRTH:   DATE OF ADMISSION:  DATE OF DISCHARGE:                                HISTORY & PHYSICAL   CHIEF COMPLAINT:  Abdominal pain, acid reflux.   HISTORY OF PRESENT ILLNESS:  Sergio Boyle is a 77 year old Caucasian gentleman who  presents today for further evaluation of above-stated symptoms.  He was last  seen in March 2004 with chronic GERD and IBS.  He states over the last two  months he has been having a flare up of his acid reflux disease and has  epigastric pain.  Denies any nausea or vomiting.  He has had no significant  change in his chronic occasional dysphagia.  Denies any odynophagia.  He has  been taking Prevacid 30 mg daily and also takes liquid antacid p.r.n.  Regarding his bowel movements, he typically has approximately two solid  stools daily.  The last day or so he has felt more constipated and he is  holding his Librax accordingly.  Denies any melena or rectal bleeding.  He  is concerned regarding his symptoms as he recently had a friend who passed  away with esophageal cancer.  His only symptoms were reflux.   CURRENT MEDICATIONS:  1. Toprol-XL 25 mg daily.  2. Coumadin 4 mg every day and 5 mg on Monday.  3. Lipitor 20 mg daily.  4. Prevacid 30 mg daily.  5. Librax one daily.   ALLERGIES:  No known drug allergies.   PAST MEDICAL HISTORY:  1. Atrial fibrillation on chronic Coumadin.  2. Hypercholesterolemia.  3. Coronary artery disease status post CABG in 1996.  4. Gastroesophageal reflux disease.  Last EGD in 1999 with Dr. Karilyn Cota     revealed normal study.  5. IBS.  Colonoscopy in 1996 at the [facility] was unremarkable.  6. History of elevated PSA with multiple biopsies done by Dr. Rito Ehrlich.  He     has  had a second set of biopsies this year and results are pending.  7. He has had bilateral inguinal hernia repair.   FAMILY HISTORY:  Mother had Bright's disease and died in her 104s.  Father  died at age 37, had a history of peripheral vascular disease status post  lower extremity amputation.  Denies any family history of colorectal cancer.   SOCIAL HISTORY:  He is married to his second wife.  His first wife is  deceased.  He has one child from the first marriage and he has two step-  children from his second marriage.  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  Never been a smoker.  Denies any alcohol  use.   REVIEW OF SYSTEMS:  Please see HPI for GI.  GENERAL:  Denies any weight  loss.  CARDIOPULMONARY:  Denies any chest pain or shortness of breath.   PHYSICAL EXAMINATION:  VITAL SIGNS:  Weight 169.5, blood pressure 106/72,  pulse 76.  GENERAL:  Pleasant, well-nourished, well-developed Caucasian male in no  acute distress.  SKIN:  Warm and dry, no jaundice.  HEENT:  Conjunctivae are pink, sclerae nonicteric.  Oropharyngeal mucosa  moist and pink.  No lesions, erythema, or exudate.  No lymphadenopathy or  thyromegaly.  CHEST:  Lungs are clear to auscultation.  CARDIAC:  Reveals irregularly irregular rhythm, normal S1, S2.  No murmurs,  rubs, or gallops.  ABDOMEN:  Positive bowel sounds, soft, nontender, nondistended.  No  organomegaly or masses.  EXTREMITIES:  No edema.   IMPRESSION:  1. Chronic gastroesophageal reflux disease, poorly controlled symptoms last     two months.  2. Epigastric pain possibly due to poorly controlled reflux.  The patient     gives history of remote peptic ulcer disease.  3. Irritable bowel syndrome, doing fairly well with low-dose Librax.  4. Chronic dysphagia felt to be due to esophageal motility disorder,     symptoms unchanged, not progressive.  5. Chronic atrial fibrillation on Coumadin therapy.   PLAN:  1. EGD in the near  future.  2. Will hold his Coumadin for four days prior to procedure as biopsies or     dilatation may be necessary.  3. He will continue Prevacid 30 mg daily for now.  May need to consider     increasing dose; however, will hold until after procedure.  4. He will continue Librax one tablet daily as needed.  He will hold     medication if he feels he is becoming constipated.  5. Further recommendations to follow.     _____________________________________  ___________________________________________  Tana Coast, P.A.                      Lionel December, M.D.   LL/MEDQ  D:  10/15/2003  T:  10/15/2003  Job:  [id number]   cc:   Angus G. Renard Matter, M.D.  [address]  [place]  [place] [address]  Fax: [phone]

## 2011-03-16 NOTE — Op Note (Signed)
NAMEISAIR, INABINET                 ACCOUNT NO.:  [id number]   MEDICAL RECORD NO.:  [id number]          PATIENT TYPE:  AMB   LOCATION:  DAY                           FACILITY:  [facility]   PHYSICIAN:  Lionel December, M.D.    DATE OF BIRTH:  09/05/1928   DATE OF PROCEDURE:  06/14/2006  DATE OF DISCHARGE:                                 OPERATIVE REPORT   PROCEDURE:  Esophagogastroduodenoscopy with esophageal dilation followed by  colonoscopy.   INDICATIONS:  Sergio Boyle is a 77 year old Caucasian male who has chronic GERD and  is maintained on PPI with p.r.n. H2B who is having solid food dysphagia.  He  is having problems with food getting stuck in his throat as well as the mid  sternal level.  He has history of proximal esophageal narrowing or web and  was previously dilated in December 2004.  He is also undergoing screening  colonoscopy.  He does give history of fecal incontinence.  No history of  rectal bleeding, although he did notice some blood after he took prep for a  colonoscopy.  Procedure and risks were reviewed with the patient, and  informed consent was obtained.   MEDICATIONS FOR CONSCIOUS SEDATION:  Benzocaine spray for pharyngeal topical  anesthesia, Demerol 50 mg IV, Versed 7 mg IV.   FINDINGS:  Procedure was performed in endoscopy suite.  The patient's vital  signs and O2 saturation were monitored during the procedure and remained  stable.   PROCEDURE #1:  Esophagogastroduodenoscopy.  The patient was placed in left  lateral position, and Olympus videoscope was passed via oropharynx without  any difficulty into esophagus.  Mucosa of the esophagus was normal.  GE  junction was at 41 cm.  There was no ring or stricture noted.  Mucosa of the  proximal esophagus was carefully examined on the way out, and there was no  obvious mucosal abnormality or a web noted.   Stomach.  It was empty and distended very well with insufflation.  Folds of  proximal stomach were normal.  Examination  of mucosa at body, antrum,  pyloric channel as well as angularis, fundus and cardia was normal.   Duodenum.  Bulbar mucosa was normal.  Scope was passed in second part of  duodenum where mucosa and folds were normal.  Endoscope was withdrawn.   Esophagus was dilated by passing 56-French Maloney dilator to full  insertion.  As the dilator was with withdrawn, endoscope was passed again,  and there was a mucosal disruption at cervical esophagus felt to be either  due to inconspicuous web or a tubular narrowing.  Pictures taken for the  record.  Endoscope was withdrawn, and the patient prepared for procedure #2.   PROCEDURE #2:  Colonoscopy.  Rectal examination performed.  No abnormality  noted ON external or digital exam.  Olympus videoscope was placed IN rectum  and advanced under vision into sigmoid colon and beyond.  Preparation was  satisfactory.  Scope was passed into cecum which was identified by  appendiceal orifice and ileocecal valve.  As the scope was withdrawn,  colonic mucosa  was carefully examined and was normal throughout.  Rectal  mucosa similarly was normal.  Scope was retroflexed to examine anorectal  antrum, and small hemorrhoids were noted below the dentate line along with  telangiectasia and mucosal pallor.  These changes were felt to be due to  prior radiation injury.  Endoscope was straightened and withdrawn.  The  patient tolerated the procedure well.   FINAL DIAGNOSIS:  Possible esophageal web at cervical esophagus.  Esophagus  dilated by passing 56-French Maloney dilator in this area.  Proximal  esophageal narrowing versus conspicuous esophageal web.  This segment was  dilated with mucosal disruption by passing 56-French [facility] dilator.   Normal exam of the stomach and first and second part of duodenum.   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  No intervention  necessary.   Small external hemorrhoids.   RECOMMENDATIONS:  The patient will resume  his Coumadin at usual dose and  should get his INR checked in 7 to 10 days.   He will resume his usual medications.  He also needs to be on a fiber  supplement 3 to 4 g per day.   He will return for OV in 1 month from now.      Lionel December, M.D.  Electronically Signed     NR/MEDQ  D:  06/14/2006  T:  06/14/2006  Job:  [id number]   cc:   Angus G. Renard Matter, MD  Fax: [phone]

## 2011-03-29 ENCOUNTER — Ambulatory Visit (HOSPITAL_COMMUNITY)
Admission: RE | Admit: 2011-03-29 | Discharge: 2011-03-29 | Disposition: A | Discharge status: Home / Self Care | Payer: Medicare Other | Source: Ambulatory Visit | Attending: Family Medicine | Admitting: Family Medicine

## 2011-03-29 DIAGNOSIS — I369 Nonrheumatic tricuspid valve disorder, unspecified: Secondary | ICD-10-CM

## 2011-03-29 DIAGNOSIS — R609 Edema, unspecified: Secondary | ICD-10-CM | POA: Insufficient documentation

## 2011-03-29 DIAGNOSIS — I4891 Unspecified atrial fibrillation: Secondary | ICD-10-CM | POA: Insufficient documentation

## 2011-04-02 ENCOUNTER — Other Ambulatory Visit: Payer: Self-pay | Admitting: Cardiology

## 2011-04-30 ENCOUNTER — Encounter: Payer: Self-pay | Admitting: *Deleted

## 2011-04-30 ENCOUNTER — Ambulatory Visit (INDEPENDENT_AMBULATORY_CARE_PROVIDER_SITE_OTHER): Payer: Medicare Other | Admitting: Physician Assistant

## 2011-04-30 DIAGNOSIS — E7800 Pure hypercholesterolemia, unspecified: Secondary | ICD-10-CM

## 2011-04-30 DIAGNOSIS — I25810 Atherosclerosis of coronary artery bypass graft(s) without angina pectoris: Secondary | ICD-10-CM

## 2011-04-30 DIAGNOSIS — I5032 Chronic diastolic (congestive) heart failure: Secondary | ICD-10-CM

## 2011-04-30 DIAGNOSIS — I509 Heart failure, unspecified: Secondary | ICD-10-CM

## 2011-04-30 DIAGNOSIS — I4891 Unspecified atrial fibrillation: Secondary | ICD-10-CM

## 2011-04-30 NOTE — Assessment & Plan Note (Signed)
Well controlled and on Coumadin

## 2011-04-30 NOTE — Assessment & Plan Note (Signed)
Stable without chest pain 

## 2011-04-30 NOTE — Assessment & Plan Note (Signed)
Patient had an episode of edema, shortness of breath and elevated BNP. 2-D echo shows LVH. He probably had a touch of diastolic dysfunction. His ejection fraction is 60-65%. Patient has no edema on exam today. He is following a low sodium diet and has been under good control. His blood pressure is stable. I will not add any new medication. He did not tolerate Lasix at all. If he needs a diuretic in the future we may want to try torsemide.

## 2011-04-30 NOTE — Progress Notes (Signed)
HPI:  This is an 77 year old white male patient who has history of coronary artery disease, chronic nature fibrillation, and hypercholesterolemia. He recently had trouble with edema and shortness of breath. His BNP was elevated to 1662. 2-D echo was ordered by Dr. Megan Mans Mar 29, 2011 and showed wall thickness was increased in a pattern of mild LVH, systolic function was normal ejection fraction 60-65%. Although no diagnostic regional wall motion abnormality was identified this possibility can't be excluded. He has mild to moderate mitral regurgitation, mild AI, moderate TR.  The patient was treated with Lasix but did not tolerate this. He says it put his stomach  in knots and he became very ill with it. He took it for about 3 days before stopping it. The edema did resolve and he put himself on a low-sodium diet. This is very difficult for him as he loves salt but he's been pretty diligent about it. He has had no further edema or dyspnea on exertion. He is pretty sedentary but this is mostly due to the age, and hip pain if he tries to walk too much.  Allergies  Allergen Reactions  . Sulfonamide Derivatives     Current Outpatient Prescriptions on File Prior to Visit  Medication Sig Dispense Refill  . digoxin (LANOXIN) 0.125 MG tablet TAKE ONE (1) TABLET BY MOUTH EVERY      DAY  30 tablet  6    Past Medical History  Diagnosis Date  . HYPERCHOLESTEROLEMIA   . CAD   . CAD, ARTERY BYPASS GRAFT   . ATRIAL FIBRILLATION, CHRONIC   . PROSTATE CANCER, HX OF   . GERD   . DIZZINESS   . ANEMIA, NORMOCYTIC     No past surgical history on file.  No family history on file.  History   Social History  . Marital Status: Married    Spouse Name: N/A    Number of Children: N/A  . Years of Education: N/A   Occupational History  . Not on file.   Social History Main Topics  . Smoking status: Never Smoker   . Smokeless tobacco: Not on file  . Alcohol Use: Not on file  . Drug Use: Not on file    . Sexually Active: Not on file   Other Topics Concern  . Not on file   Social History Narrative  . No narrative on file    ROS: See HPI Eyes: Negative Ears:Negative for hearing loss, tinnitus Cardiovascular: Negative for chest pain, palpitations, dyspnea, dyspnea on exertion, near-syncope, orthopnea, paroxysmal nocturnal dyspnia and syncope,edema, claudication, cyanosis,.  Respiratory:   Negative for cough, hemoptysis, shortness of breath, sleep disturbances due to breathing, sputum production and wheezing.   Endocrine: Negative for cold intolerance and heat intolerance.  Hematologic/Lymphatic: Negative for adenopathy and bleeding problem. Does not bruise/bleed easily.  Musculoskeletal: Negative.   Gastrointestinal: Negative for nausea, vomiting, reflux, abdominal pain, diarrhea, constipation.   Genitourinary: Negative for bladder incontinence, dysuria, flank pain, frequency, hematuria, hesitancy, nocturia and urgency.  Neurological: Negative.  Allergic/Immunologic: Negative for environmental allergies.   PHYSICAL EXAM Well-nournished, in no acute distress. Neck: No JVD, HJR, Bruit, or thyroid enlargement Lungs: No tachypnea, clear without wheezing, rales, or rhonchi Cardiovascular:irregular irregular, 2/6 systolic murmur at the left her water and apex, no gallops, bruit, thrill, or heave. Abdomen: BS normal. Soft without organomegaly, masses, lesions or tenderness. Extremities: without cyanosis, clubbing or edema. Good distal pulses bilateral SKin: Warm, no lesions or rashes  Musculoskeletal: No deformities Neuro: no focal  signs  BP 123/69  Pulse 57  Resp 14  Ht 5\' 9"  (1.753 m)  Wt 170 lb (77.111 kg)  BMI 25.10 kg/m2  EKG:  ASSESSMENT AND PLAN:

## 2011-04-30 NOTE — Assessment & Plan Note (Signed)
Treated with Lipitor 

## 2011-04-30 NOTE — Patient Instructions (Signed)
Keep scheduled appointment with Dr. Riley Kill on May 28, 2011.  Follow a 2 gram Sodium diet. You were given information on this today.

## 2011-05-01 ENCOUNTER — Encounter: Payer: Self-pay | Admitting: Cardiology

## 2011-05-03 ENCOUNTER — Encounter: Payer: Self-pay | Admitting: Cardiology

## 2011-05-23 ENCOUNTER — Encounter: Payer: Self-pay | Admitting: Cardiology

## 2011-05-28 ENCOUNTER — Ambulatory Visit (INDEPENDENT_AMBULATORY_CARE_PROVIDER_SITE_OTHER): Payer: Medicare Other | Admitting: Cardiology

## 2011-05-28 ENCOUNTER — Encounter: Payer: Self-pay | Admitting: Cardiology

## 2011-05-28 DIAGNOSIS — I509 Heart failure, unspecified: Secondary | ICD-10-CM

## 2011-05-28 DIAGNOSIS — R0609 Other forms of dyspnea: Secondary | ICD-10-CM

## 2011-05-28 DIAGNOSIS — I2510 Atherosclerotic heart disease of native coronary artery without angina pectoris: Secondary | ICD-10-CM

## 2011-05-28 DIAGNOSIS — I498 Other specified cardiac arrhythmias: Secondary | ICD-10-CM

## 2011-05-28 DIAGNOSIS — I4891 Unspecified atrial fibrillation: Secondary | ICD-10-CM

## 2011-05-28 DIAGNOSIS — I5032 Chronic diastolic (congestive) heart failure: Secondary | ICD-10-CM

## 2011-05-28 DIAGNOSIS — R001 Bradycardia, unspecified: Secondary | ICD-10-CM

## 2011-05-28 MED ORDER — DIGOXIN 0.0625 MG HALF TABLET: 0.0625 mg | ORAL_TABLET | Freq: Every day | ORAL | Status: DC

## 2011-05-28 NOTE — Assessment & Plan Note (Signed)
No angina at present.  

## 2011-05-28 NOTE — Assessment & Plan Note (Signed)
Rate is fairly slow.  Will check dig level, but also reduce dose by one half.  Recheck status in two months with followup.

## 2011-05-28 NOTE — Progress Notes (Signed)
HPI:  He is doing pretty well.  Stable at the present time.  He was noted to have an elevated BNP, and had an echo that showed normal systolic function, and biaatrial enlargement.  He feels pretty good.  He was placed on furosemide, but could not tolerate due to stomach pains.  His wife convinced him to get rid of the salt shaker.  Since he has been fine.  Denies any chest pain.  Had CABG at [facility] in 1996.  No further symptoms.    Current Outpatient Prescriptions  Medication Sig Dispense Refill  . ALPRAZolam (XANAX) 0.5 MG tablet Take 0.5 mg by mouth at bedtime as needed.        Marland Kitchen atorvastatin (LIPITOR) 20 MG tablet Take 20 mg by mouth daily.        . digoxin (LANOXIN) 0.125 MG tablet TAKE ONE (1) TABLET BY MOUTH EVERY      DAY  30 tablet  6  . finasteride (PROSCAR) 5 MG tablet Take 5 mg by mouth daily.       Marland Kitchen HYDROcodone-acetaminophen (VICODIN) 5-500 MG per tablet Take by mouth every 4 (four) hours as needed.       . nitroGLYCERIN (NITROSTAT) 0.4 MG SL tablet Place 0.4 mg under the tongue every 5 (five) minutes as needed.        . pantoprazole (PROTONIX) 40 MG tablet Take 40 mg by mouth 2 (two) times daily.       Marland Kitchen warfarin (COUMADIN) 3 MG tablet as directed.         Allergies  Allergen Reactions  . Sulfonamide Derivatives     Past Medical History  Diagnosis Date  . HYPERCHOLESTEROLEMIA   . CAD   . CAD, ARTERY BYPASS GRAFT   . ATRIAL FIBRILLATION, CHRONIC   . PROSTATE CANCER, HX OF   . GERD   . DIZZINESS   . ANEMIA, NORMOCYTIC   . Brain mass     Past Surgical History  Procedure Date  . Bypass surgery - back 1996   . Bilateral inguinal hernia repair   . Esophagogastroduodenoscopy     w/esophageal dilation   . Stent of lad and diagonal     No family history on file.  History   Social History  . Marital Status: Married    Spouse Name: N/A    Number of Children: N/A  . Years of Education: N/A   Occupational History  . Not on file.   Social History Main Topics    . Smoking status: Never Smoker   . Smokeless tobacco: Not on file  . Alcohol Use: No  . Drug Use: Not on file  . Sexually Active: Not on file   Other Topics Concern  . Not on file   Social History Narrative   [name]. Has 1 child from 1st marriage and 2 step children from second.    ROS: Please see the HPI.  All other systems reviewed and negative.  PHYSICAL EXAM:  BP 104/54  Pulse 51  Resp 18  Ht 5\' 9"  (1.753 m)  Wt 165 lb 1.9 oz (74.898 kg)  BMI 24.38 kg/m2  General: Well developed, well nourished, in no acute distress. Head:  Normocephalic and atraumatic. Neck: no JVD Lungs: Clear to auscultation and percussion. Heart: irregularly irregular rate.  No def murmur.  Abdomen:  Normal bowel sounds; soft; non tender; no organomegaly Pulses: Pulses normal in all 4 extremities. Extremities: No clubbing or cyanosis. No edema. Neurologic:  Alert and oriented x 3.  EKG:  Slow vent response to atrial fib, with RBBB.   ASSESSMENT AND PLAN:

## 2011-05-28 NOTE — Patient Instructions (Addendum)
Your physician recommends that you schedule a follow-up appointment in:2 months  Your physician has recommended you make the following change in your medication: DECREASE DIGOXIN to .0625 mg daily or 1/2 tablet daily.

## 2011-05-28 NOTE — Assessment & Plan Note (Signed)
Got rid of salt.  JVP not up, and lungs clear.  Will recheck BNP, check dig level, and BMET.  Encouraged not to use salt, and to minimize use.  His wife understands and is knowledgeable.

## 2011-05-29 LAB — BASIC METABOLIC PANEL
BUN: 20 mg/dL (ref 6–23)
Chloride: 106 mEq/L (ref 96–112)
Glucose, Bld: 102 mg/dL — ABNORMAL HIGH (ref 70–99)
Potassium: 4.6 mEq/L (ref 3.5–5.1)

## 2011-05-29 LAB — BRAIN NATRIURETIC PEPTIDE: Pro B Natriuretic peptide (BNP): 149 pg/mL — ABNORMAL HIGH (ref 0.0–100.0)

## 2011-06-13 ENCOUNTER — Encounter (INDEPENDENT_AMBULATORY_CARE_PROVIDER_SITE_OTHER): Payer: Self-pay

## 2011-06-28 ENCOUNTER — Ambulatory Visit (INDEPENDENT_AMBULATORY_CARE_PROVIDER_SITE_OTHER): Payer: Medicare Other | Admitting: Internal Medicine

## 2011-06-28 VITALS — BP 104/40 | HR 68 | Temp 97.3°F | Ht 69.0 in | Wt 165.5 lb

## 2011-06-28 DIAGNOSIS — K224 Dyskinesia of esophagus: Secondary | ICD-10-CM

## 2011-06-28 DIAGNOSIS — R1314 Dysphagia, pharyngoesophageal phase: Secondary | ICD-10-CM

## 2011-06-28 DIAGNOSIS — R1310 Dysphagia, unspecified: Secondary | ICD-10-CM

## 2011-06-28 NOTE — Progress Notes (Signed)
Subjective:     Patient ID: Sergio Boyle, male   DOB: Apr 08, 1928, 78 y.o.   MRN: [id number]  HPI  Sergio Boyle is an 77 yr old male presenting today with c/o dysphagia.  He was last seen in January of this year for dysphagia.  He has a long history of dysphagia. He has undergone multiple dilatations and his last one was in September of  2011 which provided relief for 2-3weeks. Dysphagia is occuring 2-3 times a week.  He says at times water will even lodge in his esophagus. He underwent an esophageal manometry  12/12/2010 which revealed the function of the LES WNL. There were an elevated number of simultaneous swallows and poor bolus transit via impedance.  This pattern is consistent with distal esophageal spasm.  He says when he hiccups or burps it feels like his esophagus and lower ribs hurts. This has been occuring for over for 3 months.  Does not hurt any other time unless he hiccups or burps.    He does not usually have any problem eating steak or chicken.  It fells like at time water lodges in his lower esophagus and he will vomit it back up. He has BM about every other day usually.  He has has to disimpact himself at times. Colonoscopy 07/20/10: Normal terminal ileum. Distal radiation induced proctitis felt to be the patient's source of intermittent hematochezia which, by account, is very small. Small external hemorrhoids.   Review of Systems  See hpi     Objective:   Physical ExamAlert and oriented. Skin warm and dry. Oral mucosa is moist. Natural teeth in good condition. Sclera anicteric, conjunctivae is pink. Thyroid not enlarged. No cervical lymphadenopathy. Lungs clear. Heart regular rate and rhythm.  Abdomen is soft. Bowel sounds are positive. No hepatomegaly. No abdominal masses felt. No tenderness.  No edema to lower extremities. Patient is alert and oriented.      Assessment:     Dysphagia which has not resolved even after multiple dilatations., Esophageal spasm.  I discussed this case with  Dr. Karilyn Cota.     Plan:  Soft foods. Chew well. Stool softner.  Increase fiber, Miralax 1/2 to 1 scoop daily.    He will follow up in 6 months.

## 2011-08-08 ENCOUNTER — Ambulatory Visit (INDEPENDENT_AMBULATORY_CARE_PROVIDER_SITE_OTHER): Payer: Medicare Other | Admitting: Cardiology

## 2011-08-08 ENCOUNTER — Encounter: Payer: Self-pay | Admitting: Cardiology

## 2011-08-08 VITALS — BP 110/60 | HR 60 | Resp 18 | Ht 69.0 in | Wt 167.4 lb

## 2011-08-08 DIAGNOSIS — I4891 Unspecified atrial fibrillation: Secondary | ICD-10-CM

## 2011-08-08 DIAGNOSIS — I2510 Atherosclerotic heart disease of native coronary artery without angina pectoris: Secondary | ICD-10-CM

## 2011-08-08 DIAGNOSIS — E7800 Pure hypercholesterolemia, unspecified: Secondary | ICD-10-CM

## 2011-08-08 NOTE — Assessment & Plan Note (Signed)
His rate is slightly higher and he feels better.  Would continue at present.

## 2011-08-08 NOTE — Assessment & Plan Note (Signed)
Followed by Dr. Mcinnis.  

## 2011-08-08 NOTE — Progress Notes (Signed)
HPI:  Mrs. Capobianco feels he is doing much better.  His HR has been up between 60-80.  Tolerating lower dose of dig with more energy.  Still he thinks his neck bothers him.  Wife is a Engineer, civil (consulting), and he is on target with his INR.    Current Outpatient Prescriptions  Medication Sig Dispense Refill  . ALPRAZolam (XANAX) 0.5 MG tablet Take 0.5 mg by mouth at bedtime as needed.        Marland Kitchen atorvastatin (LIPITOR) 20 MG tablet Take 20 mg by mouth daily.        . digoxin (LANOXIN) 0.0625 mg TABS Take 0.5 tablets (0.0625 mg total) by mouth daily.  30 tablet  6  . finasteride (PROSCAR) 5 MG tablet Take 5 mg by mouth daily.       Marland Kitchen HYDROcodone-acetaminophen (VICODIN) 5-500 MG per tablet Take by mouth every 4 (four) hours as needed.       . nitroGLYCERIN (NITROSTAT) 0.4 MG SL tablet Place 0.4 mg under the tongue every 5 (five) minutes as needed.        . pantoprazole (PROTONIX) 40 MG tablet Take 40 mg by mouth 2 (two) times daily.       Marland Kitchen warfarin (COUMADIN) 3 MG tablet as directed.         Allergies  Allergen Reactions  . Sulfonamide Derivatives     Past Medical History  Diagnosis Date  . HYPERCHOLESTEROLEMIA   . CAD   . CAD, ARTERY BYPASS GRAFT   . ATRIAL FIBRILLATION, CHRONIC   . PROSTATE CANCER, HX OF   . GERD   . DIZZINESS   . ANEMIA, NORMOCYTIC   . Brain mass   . Dysphagia   . Weight loss   . Hiccups     Past Surgical History  Procedure Date  . Bypass surgery - back 1996   . Bilateral inguinal hernia repair   . Esophagogastroduodenoscopy     w/esophageal dilation   . Stent of lad and diagonal   . Colonoscopy 06/2010  . Upper gastrointestinal endoscopy 06/2010    EGD ED    No family history on file.  History   Social History  . Marital Status: Married    Spouse Name: N/A    Number of Children: N/A  . Years of Education: N/A   Occupational History  . Not on file.   Social History Main Topics  . Smoking status: Never Smoker   . Smokeless tobacco: Not on file  . Alcohol Use:  No  . Drug Use: Not on file  . Sexually Active: Not on file   Other Topics Concern  . Not on file   Social History Narrative   [name]. Has 1 child from 1st marriage and 2 step children from second.    ROS: Please see the HPI.  All other systems reviewed and negative.  PHYSICAL EXAM:  BP 110/60  Pulse 60  Resp 18  Ht 5\' 9"  (1.753 m)  Wt 167 lb 6.4 oz (75.932 kg)  BMI 24.72 kg/m2  General: Well developed, well nourished, in no acute distress. Head:  Normocephalic and atraumatic. Neck: no JVD Lungs: Clear to auscultation and percussion. Heart:  Irregularly irregular rhythm.  No definite murmur.   Abdomen:  Normal bowel sounds; soft; non tender; no organomegaly Pulses: Pulses normal in all 4 extremities. Extremities: No clubbing or cyanosis. No edema. Neurologic: Alert and oriented x 3.  EKG:  Atrial fibrillation.  RBBB.  Controlled ventricular  response.   ASSESSMENT AND PLAN:

## 2011-08-08 NOTE — Patient Instructions (Signed)
Your physician wants you to follow-up in: 6 MONTHS.  You will receive a reminder letter in the mail two months in advance. If you don't receive a letter, please call our office to schedule the follow-up appointment.  Your physician recommends that you continue on your current medications as directed. Please refer to the Current Medication list given to you today.  

## 2011-08-08 NOTE — Assessment & Plan Note (Signed)
Stable.  No angina  

## 2011-09-24 ENCOUNTER — Encounter (INDEPENDENT_AMBULATORY_CARE_PROVIDER_SITE_OTHER): Payer: Self-pay | Admitting: Internal Medicine

## 2011-09-24 ENCOUNTER — Ambulatory Visit (INDEPENDENT_AMBULATORY_CARE_PROVIDER_SITE_OTHER): Payer: Medicare Other | Admitting: Internal Medicine

## 2011-09-24 DIAGNOSIS — R070 Pain in throat: Secondary | ICD-10-CM

## 2011-09-24 DIAGNOSIS — K224 Dyskinesia of esophagus: Secondary | ICD-10-CM

## 2011-09-24 DIAGNOSIS — R1013 Epigastric pain: Secondary | ICD-10-CM

## 2011-09-24 DIAGNOSIS — K219 Gastro-esophageal reflux disease without esophagitis: Secondary | ICD-10-CM

## 2011-09-24 NOTE — Patient Instructions (Addendum)
Try Gaviscon 1-2 tablets for heartburn on as-needed basis. Continue pantoprazole 40 mg twice daily but take it at least 30 minutes before breakfast and 30 minutes before evening meal. Notify if epigastric and throat discomfort gets worse.

## 2011-09-24 NOTE — Progress Notes (Signed)
Presenting complaint; Epigastric and throat pain on burping. Subjective: Sergio Boyle is 77 year old Caucasian male who is here for scheduled visit accompanied by his wife. He was last seen in January this year for dysphagia and sent over to [facility] for esophageal manometry and impedance study. This study was abnormal with 50% of swallows being simultaneously, dirty percent normal peristalsis and 20% aperistalsis. His UES and LES evaluation was normal. He states his dysphagia has not changed. He has intermittent difficulty with solids and at times with liquids.  Now he presents with epigastric and throat pain every time he works. Most of his burps occur after meals but not daily. Pain is not excruciating and last only a couple of seconds. He has no pain before or after burping. He denies nausea vomiting melena or rectal bleeding. His appetite is good and his weight has been stable. Current Medications: Current Outpatient Prescriptions  Medication Sig Dispense Refill  . ALPRAZolam (XANAX) 0.5 MG tablet Take 0.5 mg by mouth 2 (two) times daily.       Marland Kitchen alum & mag hydroxide-simeth (MYLANTA) 200-200-20 MG/5ML suspension Take 10 mLs by mouth as needed.        Marland Kitchen atorvastatin (LIPITOR) 20 MG tablet Take 20 mg by mouth daily.        . digoxin (LANOXIN) 0.0625 mg TABS Take 0.5 tablets (0.0625 mg total) by mouth daily.  30 tablet  6  . finasteride (PROSCAR) 5 MG tablet Take 5 mg by mouth daily.       Marland Kitchen HYDROcodone-acetaminophen (VICODIN) 5-500 MG per tablet Take by mouth every 4 (four) hours as needed.       . pantoprazole (PROTONIX) 40 MG tablet Take 40 mg by mouth 2 (two) times daily.       Marland Kitchen warfarin (COUMADIN) 3 MG tablet as directed.          Objective: BP 108/68  Pulse 66  Temp(Src) 97.7 F (36.5 C) (Oral)  Resp 12  Ht 5\' 9"  (1.753 m)  Wt 173 lb (78.472 kg)  BMI 25.55 kg/m2 Conjunctiva is pink.sclera is non-icteric. Oropharyngeal mucosa is normal. No neck masses or  thyromegaly noted. Cardiac exam with irregular rhythm normal S1 and S2 without murmur or gallop. Lungs are clear to auscultation. Abdomen is soft and nontender without organomegaly or masses. No LE edema noted.   Assessment: Patient's symptom of epigastric and throat pain with burping is very intriguing and possibly indicative of hyperalgesia or very sensitive upper GI tract. Esophageal manometry revealed 50% of swallows with simultaneous peristalsis however amply shoed was normal. He does not have any alarm symptoms. He has 2 to get out which foods or drinks causing to burp and try to avoid them. I do not believe there is indication for EGD or other studies at this time. Dysphagia secondary to esophageal motility disorder. He has been intolerant of calcium channel blockers in the past   Plan: Patient will continue anti-reflex measures and pantoprazole twice daily. He can try Gaviscon 1-2 tablets for heartburn or prior to burping episodes if he can figure it out. Unless symptoms get worse he will return for followup in one year.

## 2011-11-15 ENCOUNTER — Encounter (INDEPENDENT_AMBULATORY_CARE_PROVIDER_SITE_OTHER): Payer: Self-pay | Admitting: *Deleted

## 2011-12-18 ENCOUNTER — Other Ambulatory Visit (INDEPENDENT_AMBULATORY_CARE_PROVIDER_SITE_OTHER): Payer: Self-pay | Admitting: *Deleted

## 2011-12-18 MED ORDER — PANTOPRAZOLE SODIUM 40 MG PO TBEC: 40.0000 mg | DELAYED_RELEASE_TABLET | Freq: Two times a day (BID) | ORAL | Status: DC

## 2011-12-18 NOTE — Telephone Encounter (Signed)
Patient needs Pantoprazole Sodium 40 mg, take one tablet twice daily refilled.

## 2011-12-25 ENCOUNTER — Ambulatory Visit (INDEPENDENT_AMBULATORY_CARE_PROVIDER_SITE_OTHER): Payer: Medicare Other | Admitting: Internal Medicine

## 2011-12-25 ENCOUNTER — Encounter (INDEPENDENT_AMBULATORY_CARE_PROVIDER_SITE_OTHER): Payer: Self-pay | Admitting: Internal Medicine

## 2011-12-25 DIAGNOSIS — K224 Dyskinesia of esophagus: Secondary | ICD-10-CM

## 2011-12-25 DIAGNOSIS — R1310 Dysphagia, unspecified: Secondary | ICD-10-CM

## 2011-12-25 DIAGNOSIS — K219 Gastro-esophageal reflux disease without esophagitis: Secondary | ICD-10-CM

## 2011-12-25 NOTE — Progress Notes (Signed)
Presenting complaint; Followup for dysphagia and IBS. Subjective: Patient is 78 year old Caucasian male was there for scheduled visit accompanied by his wife. He states his symptoms have not changed in any way. He has lost 7 pounds since his last visit 3 months ago and this is felt to be secondary to loss of fluid from his lower extremities. His dysphagia is intermittent both to solids as well as liquids but not experience every day. He has more difficulty with meats. He rarely experiences heartburn. His appetite is normal. He is having occasional episodes of urgency and diarrhea but no rectal bleeding or melena he is taking pain medication primarily for his headache. He is also seeing chiropractor for neck pain. Current Medications: Current Outpatient Prescriptions  Medication Sig Dispense Refill  . ALPRAZolam (XANAX) 0.5 MG tablet Take 0.5 mg by mouth 2 (two) times daily.       Marland Kitchen alum & mag hydroxide-simeth (MYLANTA) 200-200-20 MG/5ML suspension Take 10 mLs by mouth as needed.        Marland Kitchen atorvastatin (LIPITOR) 20 MG tablet Take 20 mg by mouth daily.        . digoxin (LANOXIN) 0.0625 mg TABS Take 0.5 tablets (0.0625 mg total) by mouth daily.  30 tablet  6  . finasteride (PROSCAR) 5 MG tablet Take 5 mg by mouth daily.       Marland Kitchen HYDROcodone-acetaminophen (VICODIN) 5-500 MG per tablet Take by mouth every 4 (four) hours as needed.       . pantoprazole (PROTONIX) 40 MG tablet Take 1 tablet (40 mg total) by mouth 2 (two) times daily.  60 tablet  4  . warfarin (COUMADIN) 3 MG tablet as directed.         Objective: Blood pressure 116/68, pulse 66, temperature 97.8 F (36.6 C), temperature source Oral, resp. rate 14, height 5\' 9"  (1.753 m), weight 165 lb 14.4 oz (75.252 kg). Patient appears comfortable. Conjunctiva is pink. Sclera is nonicteric Oral pharyngeal mucosa is normal. No neck masses or thyromegaly noted. Cardiac exam with regular rhythm normal S1 and S2. No murmur or gallop noted. Lungs are  clear to auscultation. Abdomen;  No LE edema or clubbing noted.  Assessment: #1. Dysphagia. This is a chronic symptoms secondary to esophageal motility disorder. He had his official manometry at [facility] last year revealing 50% simultaneous peristalsis 20% hypopyon results is only 30% were normal. He has not responded to esophageal dilation in the past. He seemed to be coping well. #2. IBS.  He is doing well with low-dose alprazolam.   Plan: Can decrease pantoprazole to 40 mg by mouth every morning. If he develops breakthrough symptoms he can go back on twice a day. Office visit in one year unless symptoms change.

## 2011-12-25 NOTE — Patient Instructions (Signed)
Notify if swallowing difficulty gets worse. 

## 2012-01-15 ENCOUNTER — Other Ambulatory Visit (INDEPENDENT_AMBULATORY_CARE_PROVIDER_SITE_OTHER): Payer: Self-pay | Admitting: *Deleted

## 2012-01-15 NOTE — Telephone Encounter (Signed)
[facility] has requested a refill on Alprazolam 0.5 mg, take 1/2 to one tablet twice daily as needed.

## 2012-01-16 ENCOUNTER — Encounter (INDEPENDENT_AMBULATORY_CARE_PROVIDER_SITE_OTHER): Payer: Self-pay | Admitting: *Deleted

## 2012-01-16 NOTE — Telephone Encounter (Signed)
[facility] has requested a refill on Alprazolam 0.5 mg, take 1/2 to 1 tablet twice daily as needed.

## 2012-01-17 MED ORDER — ALPRAZOLAM 0.5 MG PO TABS: 0.5000 mg | ORAL_TABLET | Freq: Three times a day (TID) | ORAL | Status: DC | PRN

## 2012-01-17 NOTE — Telephone Encounter (Signed)
A user error has taken place.

## 2012-02-06 ENCOUNTER — Encounter: Payer: Self-pay | Admitting: Cardiology

## 2012-02-06 ENCOUNTER — Ambulatory Visit (INDEPENDENT_AMBULATORY_CARE_PROVIDER_SITE_OTHER): Payer: Medicare Other | Admitting: Cardiology

## 2012-02-06 VITALS — BP 138/72 | HR 71 | Ht 69.0 in | Wt 168.0 lb

## 2012-02-06 DIAGNOSIS — I4891 Unspecified atrial fibrillation: Secondary | ICD-10-CM

## 2012-02-06 DIAGNOSIS — I509 Heart failure, unspecified: Secondary | ICD-10-CM

## 2012-02-06 DIAGNOSIS — I2510 Atherosclerotic heart disease of native coronary artery without angina pectoris: Secondary | ICD-10-CM

## 2012-02-06 DIAGNOSIS — I5032 Chronic diastolic (congestive) heart failure: Secondary | ICD-10-CM

## 2012-02-06 LAB — BASIC METABOLIC PANEL
GFR: 60.54 mL/min (ref >60.00)
Glucose, Bld: 87 mg/dL (ref 70–99)
Potassium: 4.8 mEq/L (ref 3.5–5.1)
Sodium: 142 mEq/L (ref 135–145)

## 2012-02-06 LAB — BRAIN NATRIURETIC PEPTIDE: Pro B Natriuretic peptide (BNP): 167 pg/mL — ABNORMAL HIGH (ref 0.0–100.0)

## 2012-02-06 NOTE — Progress Notes (Signed)
HPI:  [name] is holding his own.  He is not very active in general.  He is not having chest pain.  He still has some dizziness.  He tolerates his meds reasonably well.  I have encouraged him to remain a bit more active.    Current Outpatient Prescriptions  Medication Sig Dispense Refill  . ALPRAZolam (XANAX) 0.5 MG tablet Take 0.5 mg by mouth 2 (two) times daily.       Marland Kitchen alum & mag hydroxide-simeth (MYLANTA) 200-200-20 MG/5ML suspension Take 10 mLs by mouth as needed.        Marland Kitchen atorvastatin (LIPITOR) 20 MG tablet Take 20 mg by mouth daily.        . digoxin (LANOXIN) 0.0625 mg TABS Take 0.5 tablets (0.0625 mg total) by mouth daily.  30 tablet  6  . finasteride (PROSCAR) 5 MG tablet Take 5 mg by mouth daily.       Marland Kitchen HYDROcodone-acetaminophen (VICODIN) 5-500 MG per tablet Take by mouth every 4 (four) hours as needed.       . pantoprazole (PROTONIX) 40 MG tablet Take 1 tablet (40 mg total) by mouth 2 (two) times daily.  60 tablet  4  . warfarin (COUMADIN) 3 MG tablet as directed.       Marland Kitchen ALPRAZolam (XANAX) 0.5 MG tablet Take 1 tablet (0.5 mg total) by mouth 3 (three) times daily as needed for sleep or anxiety.  30 tablet  0    Allergies  Allergen Reactions  . Sulfonamide Derivatives     Past Medical History  Diagnosis Date  . HYPERCHOLESTEROLEMIA   . CAD   . CAD, ARTERY BYPASS GRAFT   . ATRIAL FIBRILLATION, CHRONIC   . PROSTATE CANCER, HX OF   . GERD   . DIZZINESS   . ANEMIA, NORMOCYTIC   . Brain mass   . Dysphagia   . Weight loss   . Hiccups     Past Surgical History  Procedure Date  . Bypass surgery - back 1996   . Bilateral inguinal hernia repair   . Esophagogastroduodenoscopy     w/esophageal dilation   . Stent of lad and diagonal   . Colonoscopy 06/2010  . Upper gastrointestinal endoscopy 06/2010    EGD ED    No family history on file.  History   Social History  . Marital Status: Married    Spouse Name: N/A    Number of Children: N/A  . Years of  Education: N/A   Occupational History  . Not on file.   Social History Main Topics  . Smoking status: Never Smoker   . Smokeless tobacco: Never Used  . Alcohol Use: No  . Drug Use: No  . Sexually Active: Not on file   Other Topics Concern  . Not on file   Social History Narrative   [name]. Has 1 child from 1st marriage and 2 step children from second.    ROS: Please see the HPI.  All other systems reviewed and negative.  PHYSICAL EXAM:  BP 138/72  Pulse 71  Ht 5\' 9"  (1.753 m)  Wt 168 lb (76.204 kg)  BMI 24.81 kg/m2  General: Well developed, well nourished, in no acute distress. Head:  Normocephalic and atraumatic. Neck: no JVD Lungs: Clear to auscultation and percussion. Heart: irregularly irregular without signficant murmur.   Abdomen:  Normal bowel sounds; soft; non tender; no organomegaly Pulses: Pulses normal in all 4 extremities. Extremities: No clubbing or cyanosis.  No edema. Neurologic: Alert and oriented x 3.  EKG:  Atrial fib.  RBBB.  Nonspecific ST and T changes.    ASSESSMENT AND PLAN:

## 2012-02-06 NOTE — Patient Instructions (Signed)
Your physician recommends that you continue on your current medications as directed. Please refer to the Current Medication list given to you today.  Your physician recommends that you have lab work today: BMP, BNP and Digoxin level  Your physician recommends that you schedule a follow-up appointment in: 3 MONTHS with Dr Riley Kill

## 2012-02-07 LAB — DIGOXIN LEVEL: Digoxin Level: 0.6 ng/mL — ABNORMAL LOW (ref 0.8–2.0)

## 2012-02-09 NOTE — Assessment & Plan Note (Signed)
This was entertained about a year ago.  He is stable.  Will check BNP.

## 2012-02-09 NOTE — Assessment & Plan Note (Signed)
No ischemic symptoms, and stable coronary anatomy noted in 2008.  Continued medical therapy with statins and asa seems appropriate.  Has prior stent as well.

## 2012-02-09 NOTE — Assessment & Plan Note (Signed)
Remains on chronic warfarin therapy.  Also, his rate is relatively well controlled. The patient has had an abnormal MRI with a chronic area of hemorrhagic change in left occipital, ? Secondary to hemangioma, and also left cerebellum.  These have remained largely unchanged on chronic anticoagulation over a long period without change.  As such, continued anticoagulation because of the risks of stroke have been maintained, and he has done relatively well.  He has had follow up MRIs through Dr. Renard Matter.

## 2012-03-14 ENCOUNTER — Other Ambulatory Visit: Payer: Self-pay | Admitting: Family Medicine

## 2012-03-14 DIAGNOSIS — M545 Low back pain: Secondary | ICD-10-CM

## 2012-04-01 ENCOUNTER — Other Ambulatory Visit: Payer: Medicare Other

## 2012-04-03 ENCOUNTER — Ambulatory Visit
Admission: RE | Admit: 2012-04-03 | Discharge: 2012-04-03 | Disposition: A | Discharge status: Home / Self Care | Payer: Medicare Other | Source: Ambulatory Visit | Attending: Family Medicine | Admitting: Family Medicine

## 2012-04-03 DIAGNOSIS — M545 Low back pain: Secondary | ICD-10-CM

## 2012-04-03 MED ORDER — IOHEXOL 300 MG/ML  SOLN
1.0000 mL | Freq: Once | INTRAMUSCULAR | Status: AC | PRN
  Administered 2012-04-03: 1 mL via EPIDURAL

## 2012-04-03 MED ORDER — TRIAMCINOLONE ACETONIDE 40 MG/ML IJ SUSP (RADIOLOGY)
60.0000 mg | Freq: Once | INTRAMUSCULAR | Status: AC
  Administered 2012-04-03: 60 mg via EPIDURAL

## 2012-04-03 NOTE — Discharge Instructions (Signed)

## 2012-05-16 ENCOUNTER — Ambulatory Visit (INDEPENDENT_AMBULATORY_CARE_PROVIDER_SITE_OTHER): Payer: Medicare Other | Admitting: Cardiology

## 2012-05-16 ENCOUNTER — Encounter: Payer: Self-pay | Admitting: Cardiology

## 2012-05-16 VITALS — BP 140/62 | HR 69 | Resp 18 | Ht 67.0 in | Wt 168.0 lb

## 2012-05-16 DIAGNOSIS — I2510 Atherosclerotic heart disease of native coronary artery without angina pectoris: Secondary | ICD-10-CM

## 2012-05-16 DIAGNOSIS — I340 Nonrheumatic mitral (valve) insufficiency: Secondary | ICD-10-CM

## 2012-05-16 DIAGNOSIS — E7800 Pure hypercholesterolemia, unspecified: Secondary | ICD-10-CM

## 2012-05-16 DIAGNOSIS — I059 Rheumatic mitral valve disease, unspecified: Secondary | ICD-10-CM

## 2012-05-16 DIAGNOSIS — I4891 Unspecified atrial fibrillation: Secondary | ICD-10-CM

## 2012-05-16 DIAGNOSIS — I5032 Chronic diastolic (congestive) heart failure: Secondary | ICD-10-CM

## 2012-05-16 NOTE — Progress Notes (Signed)
HPI:  The patient is in for follow up.  He is basically unchanged.  He denies any chest pain.  The dizziness is what gets him the most.  He is not very active.  Dr.McInnis apparently discussed with him holding his lipitor, and his wife thinks he may be better, but the patient is not so sure.  He also worries about his grafts without Lipitor.  I mostly listened to this conversation today.  His rate remains well controlled on medical therapy.    Current Outpatient Prescriptions  Medication Sig Dispense Refill  . ALPRAZolam (XANAX) 0.5 MG tablet Take 0.5 mg by mouth 2 (two) times daily.       Marland Kitchen alum & mag hydroxide-simeth (MYLANTA) 200-200-20 MG/5ML suspension Take 10 mLs by mouth as needed.        Marland Kitchen atorvastatin (LIPITOR) 20 MG tablet Take 20 mg by mouth daily.        . digoxin (LANOXIN) 0.0625 mg TABS Take 0.5 tablets (0.0625 mg total) by mouth daily.  30 tablet  6  . finasteride (PROSCAR) 5 MG tablet Take 5 mg by mouth daily.       Marland Kitchen HYDROcodone-acetaminophen (VICODIN) 5-500 MG per tablet Take by mouth every 4 (four) hours as needed.       . pantoprazole (PROTONIX) 40 MG tablet Take 1 tablet (40 mg total) by mouth 2 (two) times daily.  60 tablet  4  . warfarin (COUMADIN) 3 MG tablet as directed.         Allergies  Allergen Reactions  . Sulfonamide Derivatives     Patient says his wife says he's allergic to sulfa.    Past Medical History  Diagnosis Date  . HYPERCHOLESTEROLEMIA   . CAD   . CAD, ARTERY BYPASS GRAFT   . ATRIAL FIBRILLATION, CHRONIC   . PROSTATE CANCER, HX OF   . GERD   . DIZZINESS   . ANEMIA, NORMOCYTIC   . Brain mass   . Dysphagia   . Weight loss   . Hiccups     Past Surgical History  Procedure Date  . Bypass surgery - back 1996   . Bilateral inguinal hernia repair   . Esophagogastroduodenoscopy     w/esophageal dilation   . Stent of lad and diagonal   . Colonoscopy 06/2010  . Upper gastrointestinal endoscopy 06/2010    EGD ED    No family history on  file.  History   Social History  . Marital Status: Married    Spouse Name: N/A    Number of Children: N/A  . Years of Education: N/A   Occupational History  . Not on file.   Social History Main Topics  . Smoking status: Never Smoker   . Smokeless tobacco: Never Used  . Alcohol Use: No  . Drug Use: No  . Sexually Active: Not on file   Other Topics Concern  . Not on file   Social History Narrative   [name]. Has 1 child from 1st marriage and 2 step children from second.    ROS: Please see the HPI.  All other systems reviewed and negative.  PHYSICAL EXAM:  BP 140/62  Pulse 69  Resp 18  Ht 5\' 7"  (1.702 m)  Wt 168 lb (76.204 kg)  BMI 26.31 kg/m2  SpO2 84%  General:slightly anxious older gentelman in no acute distress. Head:  Normocephalic and atraumatic. Neck: no JVD Lungs: Clear to auscultation and percussion. Heart: irregularly irregular rhythm.  Minimal apical murmur.   Pulses: Pulses normal in all 4 extremities. Extremities: No clubbing or cyanosis. No edema. Neurologic: Alert and oriented x 3.  EKG:  Atrial fibrillation with controlled ventricular response.  No real change from April 2013  ASSESSMENT AND PLAN:

## 2012-05-16 NOTE — Patient Instructions (Addendum)
Your physician has requested that you have an echocardiogram. Echocardiography is a painless test that uses sound waves to create images of your heart. It provides your doctor with information about the size and shape of your heart and how well your heart's chambers and valves are working. This procedure takes approximately one hour. There are no restrictions for this procedure.  Your physician wants you to follow-up in: 6 MONTHS with Dr Stuckey.  You will receive a reminder letter in the mail two months in advance. If you don't receive a letter, please call our office to schedule the follow-up appointment.  Your physician recommends that you continue on your current medications as directed. Please refer to the Current Medication list given to you today.  

## 2012-05-26 ENCOUNTER — Telehealth (INDEPENDENT_AMBULATORY_CARE_PROVIDER_SITE_OTHER): Payer: Self-pay | Admitting: *Deleted

## 2012-05-26 NOTE — Telephone Encounter (Signed)
rec'd refill request from [facility] pharmacy  Alprazolam 0.5 mg # 60 1 tab tid

## 2012-05-26 NOTE — Telephone Encounter (Signed)
Primary Care

## 2012-05-26 NOTE — Telephone Encounter (Signed)
Pharmacy aware

## 2012-05-27 ENCOUNTER — Ambulatory Visit (HOSPITAL_COMMUNITY): Payer: Medicare Other | Attending: Cardiology | Admitting: Radiology

## 2012-05-27 DIAGNOSIS — I080 Rheumatic disorders of both mitral and aortic valves: Secondary | ICD-10-CM | POA: Insufficient documentation

## 2012-05-27 DIAGNOSIS — I4891 Unspecified atrial fibrillation: Secondary | ICD-10-CM | POA: Insufficient documentation

## 2012-05-27 DIAGNOSIS — I079 Rheumatic tricuspid valve disease, unspecified: Secondary | ICD-10-CM | POA: Insufficient documentation

## 2012-05-27 DIAGNOSIS — I2510 Atherosclerotic heart disease of native coronary artery without angina pectoris: Secondary | ICD-10-CM | POA: Insufficient documentation

## 2012-05-27 NOTE — Progress Notes (Signed)
Echocardiogram performed.  

## 2012-06-01 NOTE — Assessment & Plan Note (Signed)
Would defer this to Dr. Renard Matter as he knows the patient over a long period of time.

## 2012-06-01 NOTE — Assessment & Plan Note (Signed)
The patients atrial fib is well controlled.  He remains on antithrombotic treatment with INR management.  He does have an abnormal CT/MRI, but it has remained stable.  He has not had a clinical change despite being on warfarin for a long time. Dr. Delma Freeze manages his warfarin.  I would not make changes in his regimen at this point as he has been stable

## 2012-06-01 NOTE — Assessment & Plan Note (Signed)
Last cath in 2008.  His anatomy was stable at that time, and overall his situation was good.  Continue medical therapy.

## 2012-06-01 NOTE — Assessment & Plan Note (Signed)
Thought to have diastolic CHF.  BNP remains slightly elevated.  Echo is scheduled.  If stable, probably does not need repeating in the absence of clinical findings.  TS

## 2012-06-04 ENCOUNTER — Other Ambulatory Visit: Payer: Self-pay | Admitting: Family Medicine

## 2012-06-04 DIAGNOSIS — M549 Dorsalgia, unspecified: Secondary | ICD-10-CM

## 2012-06-10 ENCOUNTER — Ambulatory Visit
Admission: RE | Admit: 2012-06-10 | Discharge: 2012-06-10 | Disposition: A | Discharge status: Home / Self Care | Payer: Medicare Other | Source: Ambulatory Visit | Attending: Family Medicine | Admitting: Family Medicine

## 2012-06-10 DIAGNOSIS — M549 Dorsalgia, unspecified: Secondary | ICD-10-CM

## 2012-06-10 MED ORDER — METHYLPREDNISOLONE ACETATE 40 MG/ML INJ SUSP (RADIOLOG
120.0000 mg | Freq: Once | INTRAMUSCULAR | Status: AC
  Administered 2012-06-10: 120 mg via EPIDURAL

## 2012-06-10 MED ORDER — IOHEXOL 180 MG/ML  SOLN
1.0000 mL | Freq: Once | INTRAMUSCULAR | Status: AC | PRN
  Administered 2012-06-10: 1 mL via EPIDURAL

## 2012-07-28 ENCOUNTER — Other Ambulatory Visit: Payer: Self-pay | Admitting: Cardiology

## 2012-07-29 NOTE — Telephone Encounter (Signed)
..   Requested Prescriptions   Pending Prescriptions Disp Refills  . digoxin (LANOXIN) 0.125 MG tablet [Pharmacy Med Name: DIGOXIN 0.125MG  TAB] 30 tablet 6    Sig: TAKE 0.5 TABLETS (0.0625 MG TOTAL) BY   MOUTH DAILY

## 2012-10-11 ENCOUNTER — Other Ambulatory Visit (INDEPENDENT_AMBULATORY_CARE_PROVIDER_SITE_OTHER): Payer: Self-pay | Admitting: Internal Medicine

## 2012-11-10 ENCOUNTER — Ambulatory Visit: Payer: Medicare Other | Admitting: Cardiology

## 2012-11-18 ENCOUNTER — Telehealth: Payer: Self-pay | Admitting: Cardiology

## 2012-11-18 ENCOUNTER — Ambulatory Visit: Payer: Medicare Other | Admitting: Cardiology

## 2012-11-18 NOTE — Telephone Encounter (Signed)
New  Problem:    Did not want to disclose any information stating he only wants to see speak with nurse.

## 2012-11-18 NOTE — Telephone Encounter (Signed)
I spoke with the Sergio Boyle and he just spoke with [name] and cancelled his appointment today with Dr Riley Kill due to his wife being sick.  The Sergio Boyle plans to call the office back in the next few weeks to reschedule his appointment.

## 2012-12-10 ENCOUNTER — Encounter (INDEPENDENT_AMBULATORY_CARE_PROVIDER_SITE_OTHER): Payer: Self-pay | Admitting: *Deleted

## 2012-12-25 ENCOUNTER — Ambulatory Visit (INDEPENDENT_AMBULATORY_CARE_PROVIDER_SITE_OTHER): Payer: Medicare Other | Admitting: Internal Medicine

## 2012-12-29 ENCOUNTER — Ambulatory Visit: Payer: Medicare Other | Admitting: Cardiology

## 2013-01-01 ENCOUNTER — Ambulatory Visit: Payer: Medicare Other | Admitting: Cardiology

## 2013-01-07 ENCOUNTER — Telehealth (INDEPENDENT_AMBULATORY_CARE_PROVIDER_SITE_OTHER): Payer: Self-pay | Admitting: *Deleted

## 2013-01-07 DIAGNOSIS — Z79899 Other long term (current) drug therapy: Secondary | ICD-10-CM

## 2013-01-07 NOTE — Telephone Encounter (Signed)
Per Dr.Rehman the patient needs Mg level due to being on Pantoprazole for a long period of time

## 2013-01-08 LAB — MAGNESIUM: Magnesium: 1.5 mg/dL (ref 1.5–2.5)

## 2013-01-09 ENCOUNTER — Telehealth (INDEPENDENT_AMBULATORY_CARE_PROVIDER_SITE_OTHER): Payer: Self-pay | Admitting: *Deleted

## 2013-01-09 NOTE — Telephone Encounter (Signed)
Per Dr.Rehman the patient will need to repeat Mag. in 6 monthsd

## 2013-01-13 ENCOUNTER — Encounter (INDEPENDENT_AMBULATORY_CARE_PROVIDER_SITE_OTHER): Payer: Self-pay | Admitting: Internal Medicine

## 2013-01-13 ENCOUNTER — Ambulatory Visit (INDEPENDENT_AMBULATORY_CARE_PROVIDER_SITE_OTHER): Payer: Medicare Other | Admitting: Internal Medicine

## 2013-01-13 VITALS — BP 110/68 | HR 74 | Temp 97.3°F | Resp 18 | Ht 69.0 in | Wt 168.3 lb

## 2013-01-13 DIAGNOSIS — K219 Gastro-esophageal reflux disease without esophagitis: Secondary | ICD-10-CM

## 2013-01-13 DIAGNOSIS — R198 Other specified symptoms and signs involving the digestive system and abdomen: Secondary | ICD-10-CM

## 2013-01-13 DIAGNOSIS — K224 Dyskinesia of esophagus: Secondary | ICD-10-CM

## 2013-01-13 MED ORDER — MAGNESIUM OXIDE 400 MG PO TABS: 400.0000 mg | ORAL_TABLET | ORAL | Status: DC

## 2013-01-13 NOTE — Progress Notes (Signed)
Presenting complaint;  Followup for dysphagia GERD. Having difficulty with bowel movements.  Subjective:  Patient is 79 year old Caucasian male who is here for scheduled visit accompanied by his wife Carney Bern. He has heartburn no more than once a week. On most days he takes one pantoprazole but sometimes he has taken extra dose. He feels his swallowing difficulty is about the same. He has days when he has no difficulty and on some days he has difficulty with solids and liquids. His appetite is very good. His weight has been stable. He continues to experience difficulty expelling stool even in stool is soft. At times he passes fecal balls. He denies melena or rectal bleeding but occasions he has fecal seepage when he passes clear mucus. He is doing regular exercises for muscle strengthening but does not walk it goes of bilateral knee pain. Since his last visit he had injection to his right knee.  Current Medications: Current Outpatient Prescriptions  Medication Sig Dispense Refill  . ALPRAZolam (XANAX) 0.5 MG tablet Take 0.5 mg by mouth 2 (two) times daily.       Marland Kitchen alum & mag hydroxide-simeth (MYLANTA) 200-200-20 MG/5ML suspension Take 10 mLs by mouth as needed.        Marland Kitchen atorvastatin (LIPITOR) 20 MG tablet Take 20 mg by mouth daily.        . cholecalciferol (VITAMIN D) 1000 UNITS tablet Take 1,000 Units by mouth daily.      . digoxin (LANOXIN) 0.125 MG tablet TAKE 0.5 TABLETS (0.0625 MG TOTAL) BY   MOUTH DAILY  30 tablet  6  . finasteride (PROSCAR) 5 MG tablet Take 5 mg by mouth daily.       Marland Kitchen HYDROcodone-acetaminophen (VICODIN) 5-500 MG per tablet Take by mouth every 4 (four) hours as needed.       . hydrocortisone 2.5 % cream Apply 1 application topically as needed.       . pantoprazole (PROTONIX) 40 MG tablet TAKE ONE TABLET TWICE DAILY  60 tablet  5  . warfarin (COUMADIN) 3 MG tablet as directed.        No current facility-administered medications for this visit.     Objective: Blood  pressure 110/68, pulse 74, temperature 97.3 F (36.3 C), temperature source Oral, resp. rate 18, height 5\' 9"  (1.753 m), weight 168 lb 4.8 oz (76.34 kg). Patient is alert and in no acute distress. Conjunctiva is pink. Sclera is nonicteric Oropharyngeal mucosa is normal. No neck masses or thyromegaly noted. Cardiac exam with irregular rhythm normal S1 and S2. No murmur or gallop noted. Lungs are clear to auscultation. Abdomen is soft and nontender without organomegaly or masses. No LE edema or clubbing noted.  Labs/studies Results: Serum magnesium was 1.5 and 01/07/2013(1.5 - 2.5). Serum B12 362. Vitamin D level 42   Assessment:  #1. Chronic GERD. He is doing well with therapy. On most days he takes one pantoprazole but on some days he takes an extra dose. Therefore will leave him on twice a day schedule. #2. Dysphagia. Secondary to esophageal motility disorder. Alprazolam has helped. He is taking alprazolam for nerves or anxiety. #3. Defecation disorder. He has difficulty expelling stool at times. 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 He needs to keep his stools soft. Patient's last colonoscopy was in August 2007 revealing mild radiation proctitis and external hemorrhoids. This point I do not feel there is indication or need for sigmoidoscopy or colonoscopy. #4. Low  normal serum magnesium. Since he is on chronic PPI therapy and Lanoxin and low magnesium will put him at risk for arrhythmia will ask him to take magnesium pill once a week. He is not taking  diuretic and therefore does not need it more often.   Plan:  Magnesium oxide 400 mg by mouth every week. Patient advised to eat one apple daily which would be good source of fiber for him and may help with defecation. Colace 100 mg by mouth every Monday Wednesday and Friday. Will check serum magnesium level in 6 months. Office visit in one year unless dysphagia  worsens.

## 2013-01-13 NOTE — Patient Instructions (Addendum)
Each one apple a day to get extra fiber. Colace 100 mg by mouth every Monday Wednesday and Friday. Magnesium oxide 400 mg by mouth once a week. Will recheck magnesium level in 6 months.

## 2013-01-20 ENCOUNTER — Encounter (INDEPENDENT_AMBULATORY_CARE_PROVIDER_SITE_OTHER): Payer: Self-pay

## 2013-01-23 ENCOUNTER — Other Ambulatory Visit: Payer: Self-pay | Admitting: Family Medicine

## 2013-01-23 DIAGNOSIS — M5030 Other cervical disc degeneration, unspecified cervical region: Secondary | ICD-10-CM

## 2013-01-23 DIAGNOSIS — M5410 Radiculopathy, site unspecified: Secondary | ICD-10-CM

## 2013-01-27 ENCOUNTER — Ambulatory Visit (INDEPENDENT_AMBULATORY_CARE_PROVIDER_SITE_OTHER): Payer: Medicare Other | Admitting: Cardiology

## 2013-01-27 ENCOUNTER — Encounter: Payer: Self-pay | Admitting: Cardiology

## 2013-01-27 VITALS — BP 112/60 | HR 68 | Ht 69.0 in | Wt 167.0 lb

## 2013-01-27 DIAGNOSIS — E7800 Pure hypercholesterolemia, unspecified: Secondary | ICD-10-CM

## 2013-01-27 DIAGNOSIS — I2510 Atherosclerotic heart disease of native coronary artery without angina pectoris: Secondary | ICD-10-CM

## 2013-01-27 DIAGNOSIS — I25810 Atherosclerosis of coronary artery bypass graft(s) without angina pectoris: Secondary | ICD-10-CM

## 2013-01-27 DIAGNOSIS — I4891 Unspecified atrial fibrillation: Secondary | ICD-10-CM

## 2013-01-27 NOTE — Patient Instructions (Signed)
Your physician wants you to follow-up in: 6 MONTHS with Dr Diona Browner in the [facility] (previous pt of Dr Riley Kill). You will receive a reminder letter in the mail two months in advance. If you don't receive a letter, please call our office to schedule the follow-up appointment.  Your physician recommends that you continue on your current medications as directed. Please refer to the Current Medication list given to you today.

## 2013-01-28 ENCOUNTER — Ambulatory Visit
Admission: RE | Admit: 2013-01-28 | Discharge: 2013-01-28 | Disposition: A | Discharge status: Home / Self Care | Payer: Medicare Other | Source: Ambulatory Visit | Attending: Family Medicine | Admitting: Family Medicine

## 2013-01-28 DIAGNOSIS — M5030 Other cervical disc degeneration, unspecified cervical region: Secondary | ICD-10-CM

## 2013-01-28 DIAGNOSIS — M5410 Radiculopathy, site unspecified: Secondary | ICD-10-CM

## 2013-01-28 MED ORDER — IOHEXOL 180 MG/ML  SOLN
1.0000 mL | Freq: Once | INTRAMUSCULAR | Status: AC | PRN
  Administered 2013-01-28: 1 mL via EPIDURAL

## 2013-01-28 MED ORDER — METHYLPREDNISOLONE ACETATE 40 MG/ML INJ SUSP (RADIOLOG
120.0000 mg | Freq: Once | INTRAMUSCULAR | Status: AC
  Administered 2013-01-28: 120 mg via EPIDURAL

## 2013-02-01 NOTE — Progress Notes (Signed)
HPI:  Sergio Boyle is in for follow up.  Sergio Boyle does not appear much changed.  Sergio Boyle did tell his wife and I that Sergio Boyle had perhaps three episodes of some chest pain, but it went away and did not tell anyone.  It lasted a minute or two, but does not sound progressive.  Sergio Boyle sleeps a lot and is tired much of the time.     Current Outpatient Prescriptions  Medication Sig Dispense Refill  . ALPRAZolam (XANAX) 0.5 MG tablet Take 0.5 mg by mouth 2 (two) times daily.       Marland Kitchen alum & mag hydroxide-simeth (MYLANTA) 200-200-20 MG/5ML suspension Take 10 mLs by mouth as needed.        Marland Kitchen atorvastatin (LIPITOR) 20 MG tablet Take 20 mg by mouth daily.        . cholecalciferol (VITAMIN D) 1000 UNITS tablet Take 1,000 Units by mouth daily.      . digoxin (LANOXIN) 0.125 MG tablet TAKE 0.5 TABLETS (0.0625 MG TOTAL) BY   MOUTH DAILY  30 tablet  6  . finasteride (PROSCAR) 5 MG tablet Take 5 mg by mouth daily.       Marland Kitchen HYDROcodone-acetaminophen (VICODIN) 5-500 MG per tablet Take by mouth every 4 (four) hours as needed.       . hydrocortisone 2.5 % cream Apply 1 application topically as needed.       . pantoprazole (PROTONIX) 40 MG tablet TAKE ONE TABLET TWICE DAILY  60 tablet  5  . warfarin (COUMADIN) 3 MG tablet as directed.       . magnesium oxide (MAG-OX 400) 400 MG tablet Take 1 tablet (400 mg total) by mouth every 7 (seven) days.       No current facility-administered medications for this visit.    Allergies  Allergen Reactions  . Sulfonamide Derivatives     Patient says his wife says Sergio Boyle's allergic to sulfa.    Past Medical History  Diagnosis Date  . HYPERCHOLESTEROLEMIA   . CAD   . CAD, ARTERY BYPASS GRAFT   . ATRIAL FIBRILLATION, CHRONIC   . PROSTATE CANCER, HX OF   . GERD   . DIZZINESS   . ANEMIA, NORMOCYTIC   . Brain mass   . Dysphagia   . Weight loss   . Hiccups     Past Surgical History  Procedure Laterality Date  . Bypass surgery - back  1996   . Bilateral inguinal hernia repair    .  Esophagogastroduodenoscopy      w/esophageal dilation   . Stent of lad and diagonal    . Colonoscopy  06/2010  . Upper gastrointestinal endoscopy  06/2010    EGD ED    No family history on file.  History   Social History  . Marital Status: Married    Spouse Name: N/A    Number of Children: N/A  . Years of Education: N/A   Occupational History  . Not on file.   Social History Main Topics  . Smoking status: Never Smoker   . Smokeless tobacco: Never Used  . Alcohol Use: No  . Drug Use: No  . Sexually Active: Not on file   Other Topics Concern  . Not on file   Social History Narrative   [name]. Has 1 child from 1st marriage and 2 step children from second.    ROS: Please see the HPI.  All other systems reviewed and negative.  PHYSICAL EXAM:  BP 112/60  Pulse 68  Ht 5\' 9"  (1.753 m)  Wt 167 lb (75.751 kg)  BMI 24.65 kg/m2  SpO2 94%  General: Well developed, well nourished, in no acute distress. Head:  Normocephalic and atraumatic. Neck: no JVD Lungs: Clear to auscultation and percussion. Heart: irregularly irregular rhythm..   Abdomen:  Normal bowel sounds; soft; non tender; no organomegaly Pulses: Pulses normal in all 4 extremities. Extremities: No clubbing or cyanosis. No edema. Neurologic: Alert and oriented x 3.  No drift or specific focal signs.    EKG:  Atrial fib with controlled ventricular response. RBBB.    ASSESSMENT AND PLAN:

## 2013-02-01 NOTE — Assessment & Plan Note (Signed)
See results of 2008 cath.  Three episodes of brief chest pain, lasting a miinute or two.  NO rest ECG changes.  He will inform of [place] if symtpoms persist and increase in any way.

## 2013-02-01 NOTE — Assessment & Plan Note (Signed)
His rate is well controlled and he has been stable on dig alone.  He remains on warfarin. See last CT.  This has remained stable.  Would not make major changes is meds currently.

## 2013-02-01 NOTE — Assessment & Plan Note (Signed)
Followed by Dr. Mcinnis.  

## 2013-02-04 ENCOUNTER — Other Ambulatory Visit (INDEPENDENT_AMBULATORY_CARE_PROVIDER_SITE_OTHER): Payer: Self-pay | Admitting: Internal Medicine

## 2013-02-04 MED ORDER — ALPRAZOLAM 0.5 MG PO TABS: 0.5000 mg | ORAL_TABLET | Freq: Two times a day (BID) | ORAL | Status: DC

## 2013-02-11 ENCOUNTER — Telehealth (INDEPENDENT_AMBULATORY_CARE_PROVIDER_SITE_OTHER): Payer: Self-pay | Admitting: *Deleted

## 2013-02-11 ENCOUNTER — Other Ambulatory Visit (INDEPENDENT_AMBULATORY_CARE_PROVIDER_SITE_OTHER): Payer: Self-pay | Admitting: Internal Medicine

## 2013-02-11 DIAGNOSIS — R1310 Dysphagia, unspecified: Secondary | ICD-10-CM

## 2013-02-11 NOTE — Telephone Encounter (Signed)
Kush would like for Tammy to return his call at [phone]. He has a question about a medicine. Patient would not tell me what he was needing to know.

## 2013-02-11 NOTE — Telephone Encounter (Signed)
Per Dr.Rehman the patient needs to have Barium Pill Esophagram.  Forwarded to Dewayne Hatch to arrange, called patient to let him know that she would be calling him.

## 2013-02-11 NOTE — Telephone Encounter (Signed)
Patient states that he failed to mention that he had a steroid injection in his back about a week ago , and wonders if this is what caused this?

## 2013-02-11 NOTE — Telephone Encounter (Signed)
BPE sch'd 02/12/13 at 845, patient's wife aware

## 2013-02-11 NOTE — Telephone Encounter (Signed)
I talked with the patient and he says that since he was seen in the office his reflux had gotten worse. It is 2-3 times a day. He started taking Protonix twice a day. Also he is having Dysphagia with both solids and liquids. I advised patient that I would talk with Dr.Rehman

## 2013-02-12 ENCOUNTER — Ambulatory Visit (HOSPITAL_COMMUNITY)
Admission: RE | Admit: 2013-02-12 | Discharge: 2013-02-12 | Disposition: A | Discharge status: Home / Self Care | Payer: Medicare Other | Source: Ambulatory Visit | Attending: Internal Medicine | Admitting: Internal Medicine

## 2013-02-12 DIAGNOSIS — Z951 Presence of aortocoronary bypass graft: Secondary | ICD-10-CM | POA: Insufficient documentation

## 2013-02-12 DIAGNOSIS — R1310 Dysphagia, unspecified: Secondary | ICD-10-CM | POA: Insufficient documentation

## 2013-02-15 ENCOUNTER — Other Ambulatory Visit (INDEPENDENT_AMBULATORY_CARE_PROVIDER_SITE_OTHER): Payer: Self-pay | Admitting: Internal Medicine

## 2013-02-15 MED ORDER — NITROGLYCERIN 0.4 MG SL SUBL: 0.4000 mg | SUBLINGUAL_TABLET | Freq: Three times a day (TID) | SUBLINGUAL | Status: DC

## 2013-02-17 ENCOUNTER — Telehealth (INDEPENDENT_AMBULATORY_CARE_PROVIDER_SITE_OTHER): Payer: Self-pay | Admitting: *Deleted

## 2013-02-17 ENCOUNTER — Telehealth: Payer: Self-pay | Admitting: Cardiology

## 2013-02-17 NOTE — Telephone Encounter (Signed)
Called patient back. He states that Dr.Rehman( GI) has asked him to take long acting nitroglycerin for esophageal spasms and he wanted to know if this was OK. Advised him that with his history of CAD/CABG this medication would be fine to take, but he needs to monitor his BP to make sure it does not go too low. He has a BP cuff at home and will monitor this and let us know if there are any problems.

## 2013-02-17 NOTE — Telephone Encounter (Signed)
New problem     Need to discuss medication that will be prescribe from his GI MD. Nitrostat 0.4 mg

## 2013-02-17 NOTE — Telephone Encounter (Signed)
OK to use nitroglycerin for now Please let him know

## 2013-02-17 NOTE — Telephone Encounter (Signed)
Called to schedule his 10 week f/u apt and Kaydin said he was scared to try the nitroglycerin. He already has low pressure to please let Dr. Rehman know. The f/u apt was not scheduled. His return phone number is [phone].  

## 2013-02-17 NOTE — Progress Notes (Signed)
Called to schedule his 10 week f/u apt and Jaydin said he was scared to try the nitroglycerin. He already has low pressure to please let Dr. Karilyn Cota know. The f/u apt was not scheduled. His return phone number is [phone].

## 2013-02-18 NOTE — Telephone Encounter (Signed)
Sergio Boyle agreed to try the Nitroglycerin for one week then call back with progress report. He will at that time schedule his f/u apt with Dr. Karilyn Cota.

## 2013-02-24 ENCOUNTER — Other Ambulatory Visit: Payer: Self-pay | Admitting: Family Medicine

## 2013-02-24 DIAGNOSIS — M549 Dorsalgia, unspecified: Secondary | ICD-10-CM

## 2013-03-02 ENCOUNTER — Ambulatory Visit
Admission: RE | Admit: 2013-03-02 | Discharge: 2013-03-02 | Disposition: A | Discharge status: Home / Self Care | Payer: Medicare Other | Source: Ambulatory Visit | Attending: Family Medicine | Admitting: Family Medicine

## 2013-03-02 DIAGNOSIS — M549 Dorsalgia, unspecified: Secondary | ICD-10-CM

## 2013-03-02 MED ORDER — METHYLPREDNISOLONE ACETATE 40 MG/ML INJ SUSP (RADIOLOG
120.0000 mg | Freq: Once | INTRAMUSCULAR | Status: AC
  Administered 2013-03-02: 120 mg via EPIDURAL

## 2013-03-02 MED ORDER — IOHEXOL 180 MG/ML  SOLN
1.0000 mL | Freq: Once | INTRAMUSCULAR | Status: AC | PRN
  Administered 2013-03-02: 1 mL via EPIDURAL

## 2013-03-02 NOTE — Progress Notes (Signed)
Pt instructed to take his coumadin again starting tonight.

## 2013-03-05 ENCOUNTER — Telehealth (INDEPENDENT_AMBULATORY_CARE_PROVIDER_SITE_OTHER): Payer: Self-pay | Admitting: *Deleted

## 2013-03-05 NOTE — Telephone Encounter (Signed)
Progress Report - The Nitroglycerin tablets has not help with his swallowing at all. It is only giving him a headache. His return phone number is [phone].   Sergio Boyle told Sergio Boyle that when he needed a refill on his Xanax to send it to his office so the diagnose that is listed on the bottle can be corrected. Sergio Boyle didn't want his name on it for sleep and anxiety. He is needing a new Rx with the correct diagnose sent to his pharmacy.  [name] said he thought it was at one time for IBS.

## 2013-03-05 NOTE — Telephone Encounter (Signed)
Forwarded to Dr.Rehman. 

## 2013-03-05 NOTE — Telephone Encounter (Signed)
Forwarded to Dr.Rehman for review. 

## 2013-03-06 NOTE — Telephone Encounter (Signed)
Can call in prescription for alprazolam. Patient isn't interested I would like to send him back to [facility] or [facility] for tests Please call patient.

## 2013-03-09 ENCOUNTER — Other Ambulatory Visit (INDEPENDENT_AMBULATORY_CARE_PROVIDER_SITE_OTHER): Payer: Self-pay | Admitting: *Deleted

## 2013-03-09 DIAGNOSIS — K219 Gastro-esophageal reflux disease without esophagitis: Secondary | ICD-10-CM

## 2013-03-09 DIAGNOSIS — K224 Dyskinesia of esophagus: Secondary | ICD-10-CM

## 2013-03-09 MED ORDER — ALPRAZOLAM 0.5 MG PO TABS: 0.5000 mg | ORAL_TABLET | Freq: Two times a day (BID) | ORAL | Status: DC

## 2013-03-09 NOTE — Telephone Encounter (Signed)
Prescription printed and patient is going to pick it up

## 2013-03-09 NOTE — Telephone Encounter (Signed)
Patient called and he states that  , he is to old to be going to all these places. He appreciates that Xanax being called in. This should go to [facility]

## 2013-03-10 ENCOUNTER — Ambulatory Visit (INDEPENDENT_AMBULATORY_CARE_PROVIDER_SITE_OTHER): Payer: Medicare Other | Admitting: Urology

## 2013-03-10 DIAGNOSIS — C61 Malignant neoplasm of prostate: Secondary | ICD-10-CM

## 2013-03-10 DIAGNOSIS — R350 Frequency of micturition: Secondary | ICD-10-CM

## 2013-03-10 DIAGNOSIS — R351 Nocturia: Secondary | ICD-10-CM

## 2013-03-11 ENCOUNTER — Telehealth: Payer: Self-pay | Admitting: *Deleted

## 2013-03-11 NOTE — Telephone Encounter (Signed)
This places me in a difficult situation since I have not actually met this patient. I have never prescribed Myrbetriq - I see that it has no black box warnings or direct cardiac contraindications. It is contraindicated with severe hypertension, but records do not suggest this diagnosis in this patient. Adverse reactions include hypertension and tachycardia - both of which would be important and potentially significant in a patient with CAD s/p CABG and atrial fibrillation. If he were to start on this medication, he would need to have followup of his blood pressure and heart rate. He would also be at risk for digoxin toxicity given effects on drug metabolism and would need digoxin levels - he is already on low dose. He and his gastroenterologist will have to decide whether the potential benefits of this medication outweigh the potential risks.

## 2013-03-11 NOTE — Telephone Encounter (Signed)
Please advise 

## 2013-03-11 NOTE — Telephone Encounter (Signed)
This patient has been seeing Dr Riley Kill and will be switching to Dr Diona Browner in about 5 months. Pt has seen his GI doc and they would like to put him on myrbetriq. He was told to ask cardiologist if this would be okay for him to take.

## 2013-03-12 NOTE — Telephone Encounter (Signed)
Spoke to pt wife whom is a retired Char[employer] fundraiser and agrees with MD SM and will contact prescribing MD that she notes as his Urologist, not GI, she feels tat this point they will probably NOT start him on this medication per risks, to advise results/instructions. Pt understood.

## 2013-04-06 ENCOUNTER — Telehealth (INDEPENDENT_AMBULATORY_CARE_PROVIDER_SITE_OTHER): Payer: Self-pay | Admitting: Internal Medicine

## 2013-04-06 ENCOUNTER — Other Ambulatory Visit (INDEPENDENT_AMBULATORY_CARE_PROVIDER_SITE_OTHER): Payer: Self-pay | Admitting: Internal Medicine

## 2013-04-06 MED ORDER — PANTOPRAZOLE SODIUM 40 MG PO TBEC: DELAYED_RELEASE_TABLET | ORAL | Status: DC

## 2013-04-06 NOTE — Telephone Encounter (Signed)
[facility] cannot get Protonix at this time. It is not in stock. I am going to switch him to Nexium 40mg  BID x 1 month and hopefully they will have the Protonix after this.

## 2013-04-22 ENCOUNTER — Other Ambulatory Visit: Payer: Self-pay | Admitting: Family Medicine

## 2013-04-22 DIAGNOSIS — M549 Dorsalgia, unspecified: Secondary | ICD-10-CM

## 2013-06-10 ENCOUNTER — Other Ambulatory Visit (INDEPENDENT_AMBULATORY_CARE_PROVIDER_SITE_OTHER): Payer: Self-pay | Admitting: Internal Medicine

## 2013-06-24 ENCOUNTER — Other Ambulatory Visit (INDEPENDENT_AMBULATORY_CARE_PROVIDER_SITE_OTHER): Payer: Self-pay | Admitting: *Deleted

## 2013-06-24 ENCOUNTER — Encounter (INDEPENDENT_AMBULATORY_CARE_PROVIDER_SITE_OTHER): Payer: Self-pay | Admitting: *Deleted

## 2013-06-24 DIAGNOSIS — R790 Abnormal level of blood mineral: Secondary | ICD-10-CM

## 2013-07-07 ENCOUNTER — Other Ambulatory Visit: Payer: Self-pay | Admitting: Family Medicine

## 2013-07-07 DIAGNOSIS — M542 Cervicalgia: Secondary | ICD-10-CM

## 2013-07-10 LAB — MAGNESIUM: Magnesium: 1.8 mg/dL (ref 1.5–2.5)

## 2013-07-23 ENCOUNTER — Ambulatory Visit
Admission: RE | Admit: 2013-07-23 | Discharge: 2013-07-23 | Disposition: A | Discharge status: Home / Self Care | Payer: Medicare Other | Source: Ambulatory Visit | Attending: Family Medicine | Admitting: Family Medicine

## 2013-07-23 DIAGNOSIS — M542 Cervicalgia: Secondary | ICD-10-CM

## 2013-07-23 MED ORDER — TRIAMCINOLONE ACETONIDE 40 MG/ML IJ SUSP (RADIOLOGY)
60.0000 mg | Freq: Once | INTRAMUSCULAR | Status: AC
  Administered 2013-07-23: 60 mg via EPIDURAL

## 2013-07-23 MED ORDER — IOHEXOL 300 MG/ML  SOLN
1.0000 mL | Freq: Once | INTRAMUSCULAR | Status: AC | PRN
  Administered 2013-07-23: 1 mL via EPIDURAL

## 2013-08-19 ENCOUNTER — Encounter: Payer: Medicare Other | Admitting: Cardiology

## 2013-08-19 ENCOUNTER — Encounter: Payer: Self-pay | Admitting: Cardiology

## 2013-08-19 NOTE — Progress Notes (Signed)
Cancelled. This encounter was created in error - please disregard. 

## 2013-09-09 ENCOUNTER — Encounter: Payer: Self-pay | Admitting: Cardiology

## 2013-09-09 ENCOUNTER — Ambulatory Visit (INDEPENDENT_AMBULATORY_CARE_PROVIDER_SITE_OTHER): Payer: Medicare Other | Admitting: Cardiology

## 2013-09-09 VITALS — BP 118/56 | HR 85 | Ht 69.0 in | Wt 167.0 lb

## 2013-09-09 DIAGNOSIS — I4891 Unspecified atrial fibrillation: Secondary | ICD-10-CM

## 2013-09-09 DIAGNOSIS — I2510 Atherosclerotic heart disease of native coronary artery without angina pectoris: Secondary | ICD-10-CM

## 2013-09-09 DIAGNOSIS — E782 Mixed hyperlipidemia: Secondary | ICD-10-CM

## 2013-09-09 NOTE — Assessment & Plan Note (Signed)
Symptomatically stable on medical therapy. Plan to continue medical therapy and observation for now.

## 2013-09-09 NOTE — Progress Notes (Signed)
Clinical Summary Sergio Boyle is an 79 y.o.male presenting for an office visit. He is a former patient of Dr. Riley Kill, last seen back in April. I reviewed his history. He does not indicate any angina symptoms, is functionally limited by chronic back pain, uses a cane. He reports compliance with his medications, no obvious side effects.  Echocardiogram from July 2013 revealed mild LVH with LVEF 55-60%, mild aortic regurgitation, moderately dilated left and right atrium.  Lab work done through the [facility] in September showed hemoglobin 13.0, platelets 175, hemoglobin A1c 5.9%, creatinine 1.1, potassium 5.2, AST 21, ALT 27, LDL 75, cholesterol 133, triglycerides 112, TSH 0.87.  Allergies  Allergen Reactions  . Sulfonamide Derivatives     Patient says his wife says he's allergic to sulfa.    Current Outpatient Prescriptions  Medication Sig Dispense Refill  . ALPRAZolam (XANAX) 0.5 MG tablet TAKE ONE TABLET TWICE DAILY  60 tablet  2  . alum & mag hydroxide-simeth (MYLANTA) 200-200-20 MG/5ML suspension Take 10 mLs by mouth as needed.        Marland Kitchen atorvastatin (LIPITOR) 20 MG tablet Take 20 mg by mouth daily.        . cholecalciferol (VITAMIN D) 1000 UNITS tablet Take 1,000 Units by mouth daily.      . digoxin (LANOXIN) 0.125 MG tablet TAKE 0.5 TABLETS (0.0625 MG TOTAL) BY   MOUTH DAILY  30 tablet  6  . finasteride (PROSCAR) 5 MG tablet Take 5 mg by mouth daily.       Marland Kitchen HYDROcodone-acetaminophen (VICODIN) 5-500 MG per tablet Take by mouth every 4 (four) hours as needed.       . hydrocortisone 2.5 % cream Apply 1 application topically as needed.       . magnesium oxide (MAG-OX 400) 400 MG tablet Take 1 tablet (400 mg total) by mouth every 7 (seven) days.      . nitroGLYCERIN (NITROSTAT) 0.4 MG SL tablet Place 1 tablet (0.4 mg total) under the tongue 3 (three) times daily before meals.  100 tablet  0  . pantoprazole (PROTONIX) 40 MG tablet TAKE ONE TABLET TWICE DAILY  60 tablet  5  . warfarin  (COUMADIN) 3 MG tablet as directed.        No current facility-administered medications for this visit.    Past Medical History  Diagnosis Date  . Mixed hyperlipidemia   . Coronary atherosclerosis of native coronary artery     Prior stenting of LAD and diagonal at [facility] with subsequent SVG to diagonal, LVEF 55-60%  . Atrial fibrillation   . History of prostate cancer   . GERD   . Brain mass   . Dysphagia   . Weight loss   . Hiccups     Past Surgical History  Procedure Laterality Date  . Coronary artery bypass graft  1996     Emergent SVG to diagonal at [facility]  . Bilateral inguinal hernia repair    . Esophagogastroduodenoscopy      w/esophageal dilation   . Colonoscopy  06/2010  . Upper gastrointestinal endoscopy  06/2010    Social History Mr. Simones reports that he has never smoked. He has never used smokeless tobacco. Mr. Wojnarowski reports that he does not drink alcohol.  Review of Systems Long-standing "irregular" heart rate, no major sense of palpitations. Stable appetite. No bleeding problems. Coumadin is followed by Dr. Renard Matter.  Physical Examination Filed Vitals:   09/09/13 1033  BP: 118/56  Pulse: 85  Filed Weights   09/09/13 1033  Weight: 167 lb (75.751 kg)   Patient appears comfortable at rest. HEENT: Conjunctiva and lids normal, oropharynx clear. Neck: Supple, no elevated JVP or carotid bruits, no thyromegaly. Lungs: Clear to auscultation, nonlabored breathing at rest. Cardiac: Irregular, no S3,  Soft systolic murmur, no pericardial rub. Abdomen: Soft, nontender,bowel sounds present, no guarding or rebound. Extremities: No pitting edema, distal pulses 2+. Skin: Warm and dry. Musculoskeletal: Mild kyphosis. Neuropsychiatric: Alert and oriented x3, affect grossly appropriate.   Problem List and Plan   Coronary atherosclerosis of native coronary artery Symptomatically stable on medical therapy. Plan to continue medical therapy and observation for  now.  ATRIAL FIBRILLATION, CHRONIC Not particularly symptomatic. Plan to continue Lanoxin, Coumadin is followed by Dr. Renard Matter.  Mixed hyperlipidemia Patient continues on Lipitor, recent LDL at goal.    Jonelle Sidle, M.D., F.A.C.C.

## 2013-09-09 NOTE — Patient Instructions (Signed)
Your physician wants you to follow-up in:  6 months. You will receive a reminder letter in the mail two months in advance. If you don't receive a letter, please call our office to schedule the follow-up appointment.   

## 2013-09-09 NOTE — Assessment & Plan Note (Signed)
Patient continues on Lipitor, recent LDL at goal.

## 2013-09-09 NOTE — Assessment & Plan Note (Signed)
Not particularly symptomatic. Plan to continue Lanoxin, Coumadin is followed by Dr. Renard Matter.

## 2013-09-10 ENCOUNTER — Encounter: Payer: Self-pay | Admitting: Cardiology

## 2013-09-18 ENCOUNTER — Other Ambulatory Visit (INDEPENDENT_AMBULATORY_CARE_PROVIDER_SITE_OTHER): Payer: Self-pay | Admitting: Internal Medicine

## 2013-09-18 ENCOUNTER — Telehealth (HOSPITAL_COMMUNITY): Payer: Self-pay | Admitting: Dietician

## 2013-09-18 NOTE — Telephone Encounter (Signed)
Received voicemail left at 0932.

## 2013-09-18 NOTE — Telephone Encounter (Signed)
Called back at 1259. He reports that he is concerned about his blood work results from the [place]. He reports is bloodwork showed high protein levels, however, he recently followed up with his PCP and his levels has improved significantly. HE reports he has been eating a lot of cantaloupe, apples, and peanuts and wondered about the protein content in each. He reports that he could not be on any medications due to a kidney condition. Discussed high protein food sources and also discussed importance of maintaining good blood sugar control. He was grateful for information and call back.

## 2013-09-21 ENCOUNTER — Other Ambulatory Visit: Payer: Self-pay

## 2013-09-21 MED ORDER — DIGOXIN 125 MCG PO TABS: ORAL_TABLET | ORAL | Status: DC

## 2013-10-06 ENCOUNTER — Ambulatory Visit (INDEPENDENT_AMBULATORY_CARE_PROVIDER_SITE_OTHER): Payer: Medicare Other | Admitting: Urology

## 2013-10-06 ENCOUNTER — Encounter (INDEPENDENT_AMBULATORY_CARE_PROVIDER_SITE_OTHER): Payer: Self-pay

## 2013-10-06 DIAGNOSIS — R3915 Urgency of urination: Secondary | ICD-10-CM

## 2013-10-06 DIAGNOSIS — C61 Malignant neoplasm of prostate: Secondary | ICD-10-CM

## 2013-10-07 ENCOUNTER — Telehealth: Payer: Self-pay | Admitting: Cardiology

## 2013-10-07 NOTE — Telephone Encounter (Signed)
Pt noted new medication is hyoscyamine and pt concerned per side effects noted can cause rapid or unusual/ irregular heartbeat and concerned with this causing per notes he has A-fib, please advise

## 2013-10-07 NOTE — Telephone Encounter (Signed)
Patient has concerns about new medication that Urologist wants to start him on. / tgs

## 2013-10-08 NOTE — Telephone Encounter (Signed)
Reviewed. This medicine should be used with caution in patients with cardiac disease including arrhythmias. I agree with his concerns. He should discuss with the prescribing physician how strongly indicated this medicine is, and carefully consider whether it should be continued or not.

## 2013-10-08 NOTE — Telephone Encounter (Signed)
Unable to leave a voicemail per noted patients phone line has not been set up for voicemail capability at this time.

## 2013-10-09 NOTE — Telephone Encounter (Signed)
.  left message to have patient return my call.  

## 2013-10-12 NOTE — Telephone Encounter (Signed)
Spoke to pt to advise results/instructions. Pt understood.  

## 2013-10-14 ENCOUNTER — Encounter (INDEPENDENT_AMBULATORY_CARE_PROVIDER_SITE_OTHER): Payer: Self-pay | Admitting: *Deleted

## 2013-11-10 ENCOUNTER — Encounter: Payer: Self-pay | Admitting: Cardiology

## 2013-11-10 ENCOUNTER — Ambulatory Visit (INDEPENDENT_AMBULATORY_CARE_PROVIDER_SITE_OTHER): Payer: Medicare Other | Admitting: Cardiology

## 2013-11-10 VITALS — BP 131/68 | HR 82 | Ht 69.0 in | Wt 169.0 lb

## 2013-11-10 DIAGNOSIS — I2510 Atherosclerotic heart disease of native coronary artery without angina pectoris: Secondary | ICD-10-CM

## 2013-11-10 DIAGNOSIS — I4891 Unspecified atrial fibrillation: Secondary | ICD-10-CM

## 2013-11-10 DIAGNOSIS — E782 Mixed hyperlipidemia: Secondary | ICD-10-CM

## 2013-11-10 NOTE — Patient Instructions (Signed)
Your physician recommends that you schedule a follow-up appointment in: 3 months    Your physician has requested that you have a lexiscan myoview. For further information please visit [email or web address]. Please follow instruction sheet, as given.  Your physician recommends that you continue on your current medications as directed. Please refer to the Current Medication list given to you today.

## 2013-11-10 NOTE — Assessment & Plan Note (Signed)
He continues on Lipitor. 

## 2013-11-10 NOTE — Progress Notes (Signed)
Clinical Summary Sergio Boyle is an 80 y.o.male last seen in November 2014, a former long-term patient of Dr. Lia Foyer. He presents to the office today stating that he has been experiencing some intermittent mild palpitations that reminded him of symptoms he had when his "blockages" were diagnosed years ago. He does not endorse frank chest pain or limiting breathlessness. He has had no dizziness or syncope. Symptoms have been present over the last few weeks.  Echocardiogram from July 2013 revealed mild LVH with LVEF 55-60%, mild aortic regurgitation, moderately dilated left and right atrium. I cannot locate any recent ischemic testing will review the records.  ECG shows atrial fibrillation with right bundle branch block, possible old anteroseptal infarct pattern.   Allergies  Allergen Reactions  . Sulfonamide Derivatives     Patient says his wife says he's allergic to sulfa.    Current Outpatient Prescriptions  Medication Sig Dispense Refill  . ALPRAZolam (XANAX) 0.5 MG tablet TAKE ONE TABLET TWICE DAILY  60 tablet  2  . alum & mag hydroxide-simeth (MYLANTA) 875-643-32 MG/5ML suspension Take 10 mLs by mouth as needed.        Marland Kitchen atorvastatin (LIPITOR) 20 MG tablet Take 20 mg by mouth daily.        . cholecalciferol (VITAMIN D) 1000 UNITS tablet Take 1,000 Units by mouth daily.      . digoxin (LANOXIN) 0.125 MG tablet TAKE 0.5 TABLETS (0.0625 MG TOTAL) BY   MOUTH DAILY  30 tablet  6  . HYDROcodone-acetaminophen (VICODIN) 5-500 MG per tablet Take by mouth every 4 (four) hours as needed.       . hydrocortisone 2.5 % cream Apply 1 application topically as needed.       . magnesium oxide (MAG-OX 400) 400 MG tablet Take 1 tablet (400 mg total) by mouth every 7 (seven) days.      . nitroGLYCERIN (NITROSTAT) 0.4 MG SL tablet Place 1 tablet (0.4 mg total) under the tongue 3 (three) times daily before meals.  100 tablet  0  . warfarin (COUMADIN) 3 MG tablet as directed.        No current  facility-administered medications for this visit.    Past Medical History  Diagnosis Date  . Mixed hyperlipidemia   . Coronary atherosclerosis of native coronary artery     Prior stenting of LAD and diagonal at [facility] with subsequent SVG to diagonal, LVEF 55-60%  . Atrial fibrillation   . History of prostate cancer   . GERD   . Brain mass   . Dysphagia   . Weight loss   . Hiccups     Past Surgical History  Procedure Laterality Date  . Coronary artery bypass graft  1996     Emergent SVG to diagonal at [facility]  . Bilateral inguinal hernia repair    . Esophagogastroduodenoscopy      w/esophageal dilation   . Colonoscopy  06/2010  . Upper gastrointestinal endoscopy  06/2010    Social History Sergio Boyle reports that he has never smoked. He has never used smokeless tobacco. Sergio Boyle reports that he does not drink alcohol.  Review of Systems Negative except as outlined above.  Physical Examination Filed Vitals:   11/10/13 1619  BP: 131/68  Pulse: 82   Filed Weights   11/10/13 1619  Weight: 169 lb (76.658 kg)    Patient appears comfortable at rest.  HEENT: Conjunctiva and lids normal, oropharynx clear.  Neck: Supple, no elevated JVP or carotid bruits, no  thyromegaly.  Lungs: Clear to auscultation, nonlabored breathing at rest.  Cardiac: Irregular, no S3, Soft systolic murmur, no pericardial rub.  Abdomen: Soft, nontender,bowel sounds present, no guarding or rebound.  Extremities: No pitting edema, distal pulses 2+.  Skin: Warm and dry.  Musculoskeletal: Mild kyphosis.  Neuropsychiatric: Alert and oriented x3, affect grossly appropriate.   Problem List and Plan   Coronary atherosclerosis of native coronary artery No chest pain, reporting palpitations that reminded him of prior ischemic symptoms based on his recollection. He is in chronic atrial fibrillation, but the symptoms seem different, not specifically related. ECG shows no acute findings as outlined. He has not  undergone any recent ischemic testing, and we will plan a Lexiscan Cardiolite on medical therapy to assess ischemic burden and help guide additional management options.  ATRIAL FIBRILLATION, CHRONIC Chronic, rate controlled on digoxin, tolerating Coumadin.  Mixed hyperlipidemia He continues on Lipitor.    Satira Sark, M.D., F.A.C.C.

## 2013-11-10 NOTE — Assessment & Plan Note (Signed)
Chronic, rate controlled on digoxin, tolerating Coumadin.

## 2013-11-10 NOTE — Assessment & Plan Note (Signed)
No chest pain, reporting palpitations that reminded him of prior ischemic symptoms based on his recollection. He is in chronic atrial fibrillation, but the symptoms seem different, not specifically related. ECG shows no acute findings as outlined. He has not undergone any recent ischemic testing, and we will plan a Lexiscan Cardiolite on medical therapy to assess ischemic burden and help guide additional management options.

## 2013-11-17 ENCOUNTER — Encounter (HOSPITAL_COMMUNITY)
Admission: RE | Admit: 2013-11-17 | Discharge: 2013-11-17 | Disposition: A | Discharge status: Home / Self Care | Payer: Medicare Other | Source: Ambulatory Visit | Attending: Cardiology | Admitting: Cardiology

## 2013-11-17 ENCOUNTER — Encounter (HOSPITAL_COMMUNITY): Payer: Self-pay

## 2013-11-17 DIAGNOSIS — I4891 Unspecified atrial fibrillation: Secondary | ICD-10-CM

## 2013-11-17 DIAGNOSIS — I2510 Atherosclerotic heart disease of native coronary artery without angina pectoris: Secondary | ICD-10-CM

## 2013-11-17 DIAGNOSIS — R079 Chest pain, unspecified: Secondary | ICD-10-CM

## 2013-11-17 MED ORDER — SODIUM CHLORIDE 0.9 % IJ SOLN
INTRAMUSCULAR | Status: AC
  Administered 2013-11-17: 10 mL via INTRAVENOUS
  Filled 2013-11-17: qty 10

## 2013-11-17 MED ORDER — REGADENOSON 0.4 MG/5ML IV SOLN
INTRAVENOUS | Status: AC
  Administered 2013-11-17: 0.4 mg via INTRAVENOUS
  Filled 2013-11-17: qty 5

## 2013-11-17 MED ORDER — TECHNETIUM TC 99M SESTAMIBI GENERIC - CARDIOLITE
10.8000 | Freq: Once | INTRAVENOUS | Status: AC | PRN
  Administered 2013-11-17: 11 via INTRAVENOUS

## 2013-11-17 MED ORDER — TECHNETIUM TC 99M SESTAMIBI - CARDIOLITE
30.0000 | Freq: Once | INTRAVENOUS | Status: AC | PRN
  Administered 2013-11-17: 11:00:00 30 via INTRAVENOUS

## 2013-11-17 NOTE — Progress Notes (Signed)
Stress Lab Nurses Notes - [facility]  LANDER ESLICK 11/17/2013 Reason for doing test: CAD and AFib Type of test: Wille Glaser Nurse performing test: Gerrit Halls, RN Nuclear Medicine Tech: Madelaine Etienne Echo Tech: Not Applicable MD performing test: S. McDowell/M.Bonnell Public PA Family MD: Dr. Everette Rank Test explained and consent signed: yes IV started: 22g jelco, Saline lock flushed, No redness or edema and Saline lock started in radiology Symptoms: Chest tightness Treatment/Intervention: None Reason test stopped: protocol completed After recovery IV was: Discontinued via X-ray tech and No redness or edema Patient to return to [place]. Med at : 11:45 Patient discharged: Home Patient's Condition upon discharge was: stable Comments: During test BP 119/74 & HR 99. Recovery BP 137/68 & HR 90.  Symptoms resolved in recovery Geanie Cooley T

## 2013-12-28 ENCOUNTER — Other Ambulatory Visit: Payer: Self-pay | Admitting: Family Medicine

## 2013-12-28 DIAGNOSIS — M542 Cervicalgia: Secondary | ICD-10-CM

## 2013-12-31 ENCOUNTER — Inpatient Hospital Stay
Admission: RE | Admit: 2013-12-31 | Discharge: 2013-12-31 | Disposition: A | Discharge status: Home / Self Care | Payer: Medicare Other | Source: Ambulatory Visit | Attending: Family Medicine | Admitting: Family Medicine

## 2014-01-01 ENCOUNTER — Other Ambulatory Visit (INDEPENDENT_AMBULATORY_CARE_PROVIDER_SITE_OTHER): Payer: Self-pay | Admitting: Internal Medicine

## 2014-01-18 ENCOUNTER — Ambulatory Visit (INDEPENDENT_AMBULATORY_CARE_PROVIDER_SITE_OTHER): Payer: Medicare Other | Admitting: Internal Medicine

## 2014-01-19 ENCOUNTER — Other Ambulatory Visit: Payer: Self-pay | Admitting: Family Medicine

## 2014-01-19 ENCOUNTER — Ambulatory Visit
Admission: RE | Admit: 2014-01-19 | Discharge: 2014-01-19 | Disposition: A | Discharge status: Home / Self Care | Payer: Medicare Other | Source: Ambulatory Visit | Attending: Family Medicine | Admitting: Family Medicine

## 2014-01-19 ENCOUNTER — Other Ambulatory Visit: Payer: Medicare Other

## 2014-01-19 DIAGNOSIS — M549 Dorsalgia, unspecified: Secondary | ICD-10-CM

## 2014-01-19 DIAGNOSIS — M542 Cervicalgia: Secondary | ICD-10-CM

## 2014-01-19 MED ORDER — IOHEXOL 300 MG/ML  SOLN: 1.0000 mL | Freq: Once | INTRAMUSCULAR | Status: AC | PRN

## 2014-01-19 MED ORDER — IOHEXOL 180 MG/ML  SOLN
1.0000 mL | Freq: Once | INTRAMUSCULAR | Status: AC | PRN
  Administered 2014-01-19: 1 mL via EPIDURAL

## 2014-01-19 MED ORDER — TRIAMCINOLONE ACETONIDE 40 MG/ML IJ SUSP (RADIOLOGY): 60.0000 mg | Freq: Once | INTRAMUSCULAR | Status: DC

## 2014-01-19 MED ORDER — METHYLPREDNISOLONE ACETATE 40 MG/ML INJ SUSP (RADIOLOG
120.0000 mg | Freq: Once | INTRAMUSCULAR | Status: AC
  Administered 2014-01-19: 120 mg via EPIDURAL

## 2014-01-19 NOTE — Discharge Instructions (Addendum)

## 2014-01-26 ENCOUNTER — Encounter (INDEPENDENT_AMBULATORY_CARE_PROVIDER_SITE_OTHER): Payer: Self-pay | Admitting: Internal Medicine

## 2014-01-26 ENCOUNTER — Ambulatory Visit (INDEPENDENT_AMBULATORY_CARE_PROVIDER_SITE_OTHER): Payer: Medicare Other | Admitting: Internal Medicine

## 2014-01-26 VITALS — BP 118/72 | HR 74 | Temp 97.0°F | Resp 18 | Ht 69.0 in | Wt 163.7 lb

## 2014-01-26 DIAGNOSIS — G8929 Other chronic pain: Secondary | ICD-10-CM

## 2014-01-26 DIAGNOSIS — M545 Low back pain, unspecified: Secondary | ICD-10-CM

## 2014-01-26 DIAGNOSIS — K224 Dyskinesia of esophagus: Secondary | ICD-10-CM

## 2014-01-26 DIAGNOSIS — M542 Cervicalgia: Secondary | ICD-10-CM

## 2014-01-26 DIAGNOSIS — K589 Irritable bowel syndrome without diarrhea: Secondary | ICD-10-CM

## 2014-01-26 MED ORDER — ALPRAZOLAM 0.5 MG PO TABS: ORAL_TABLET | ORAL | Status: DC

## 2014-01-26 NOTE — Progress Notes (Signed)
Presenting complaint;  Follow for dysphagia and irritable bowel syndrome.  Subjective:  Patient is a 80-year-old Caucasian male who was here for yearly visit. He has dysphagia secondary to esophageal motility disorder and history of irritable bowel syndrome. He has been on Xanax for a few years which is how the dysphagia and possibly with IBS. Few months back he became constipated and stopped pantoprazole. He says he is not having heartburn but he complains of mucus in his throat and at times he sore. He he denies dysphagia. He has lost about 4 pounds since his last visit. He is having problems with neck and back pain. He has had one injection to his neck and to the lower back. He is taking Vicodin occasionally. Lately his bowels are moving once a day. He is trying to stay on fiber rich foods. He is not having any side effects of Xanax.        Current Medications: Outpatient Encounter Prescriptions as of 01/26/2014  Medication Sig  . ALPRAZolam (XANAX) 0.5 MG tablet TAKE ONE TABLET TWICE DAILY  . alum & mag hydroxide-simeth (MYLANTA) 294-765-46 MG/5ML suspension Take 10 mLs by mouth as needed.    Marland Kitchen atorvastatin (LIPITOR) 20 MG tablet Take 20 mg by mouth daily.    . cholecalciferol (VITAMIN D) 1000 UNITS tablet Take 1,000 Units by mouth daily.  . digoxin (LANOXIN) 0.125 MG tablet TAKE 0.5 TABLETS (0.0625 MG TOTAL) BY   MOUTH DAILY  . HYDROcodone-acetaminophen (VICODIN) 5-500 MG per tablet Take by mouth every 4 (four) hours as needed.   . hydrocortisone 2.5 % cream Apply 1 application topically as needed.   . magnesium oxide (MAG-OX 400) 400 MG tablet Take 1 tablet (400 mg total) by mouth every 7 (seven) days.  Marland Kitchen warfarin (COUMADIN) 3 MG tablet as directed.   . nitroGLYCERIN (NITROSTAT) 0.4 MG SL tablet Place 1 tablet (0.4 mg total) under the tongue 3 (three) times daily before meals.     Objective: Blood pressure 118/72, pulse 74, temperature 97 F (36.1 C), temperature source Oral,  resp. rate 18, height 5\' 9"  (1.753 m), weight 163 lb 11.2 oz (74.254 kg). Patient is alert and in no acute distress. Conjunctiva is pink. Sclera is nonicteric Oropharyngeal mucosa is normal. No neck masses or thyromegaly noted. Cardiac exam with regular rhythm normal S1 and S2. No murmur or gallop noted. Lungs are clear to auscultation. Abdomen symmetrical. Soft and nontender without organomegaly or masses.  No LE edema or clubbing noted.    Assessment:  #1. Dysphagia secondary to esophageal motility disorder with symptomatic improvement on alprazolam. #2. Irritable bowel syndrome. He is not having any symptoms. #3. Throat symptoms possibly due to postnasal discharge. Patient advised to discuss this symptom with Dr. Everette Rank and find out if he benefits from intranasal topical steroids.   Plan:  New prescription given for alprazolam 0.5 mg by mouth twice a day 60 with 5 refills. He will try to decrease dose if possible as discussed. Office visit in one year.

## 2014-01-26 NOTE — Patient Instructions (Addendum)
Can go back on pantoprazole if throat symptoms get worse. Try decreasing Xanax dose to 0.25 mg twice daily.

## 2014-01-27 DIAGNOSIS — G8929 Other chronic pain: Secondary | ICD-10-CM | POA: Insufficient documentation

## 2014-01-27 DIAGNOSIS — M545 Low back pain, unspecified: Secondary | ICD-10-CM | POA: Insufficient documentation

## 2014-01-27 DIAGNOSIS — M542 Cervicalgia: Secondary | ICD-10-CM

## 2014-02-05 ENCOUNTER — Ambulatory Visit
Admission: RE | Admit: 2014-02-05 | Discharge: 2014-02-05 | Disposition: A | Discharge status: Home / Self Care | Payer: Medicare Other | Source: Ambulatory Visit | Attending: Family Medicine | Admitting: Family Medicine

## 2014-02-05 DIAGNOSIS — M542 Cervicalgia: Secondary | ICD-10-CM

## 2014-02-05 MED ORDER — TRIAMCINOLONE ACETONIDE 40 MG/ML IJ SUSP (RADIOLOGY)
60.0000 mg | Freq: Once | INTRAMUSCULAR | Status: AC
  Administered 2014-02-05: 60 mg via EPIDURAL

## 2014-02-05 MED ORDER — IOHEXOL 300 MG/ML  SOLN
1.0000 mL | Freq: Once | INTRAMUSCULAR | Status: AC | PRN
  Administered 2014-02-05: 1 mL via EPIDURAL

## 2014-02-05 NOTE — Discharge Instructions (Signed)
Post Procedure Spinal Discharge Instruction Sheet  1. You may resume a regular diet and any medications that you routinely take (including pain medications).  2. No driving day of procedure.  3. Light activity throughout the rest of the day.  Do not do any strenuous work, exercise, bending or lifting.  The day following the procedure, you can resume normal physical activity but you should refrain from exercising or physical therapy for at least three days thereafter.   Common Side Effects:   Headaches- take your usual medications as directed by your physician.  Increase your fluid intake.  Caffeinated beverages may be helpful.  Lie flat in bed until your headache resolves.   Restlessness or inability to sleep- you may have trouble sleeping for the next few days.  Ask your referring physician if you need any medication for sleep.   Facial flushing or redness- should subside within a few days.   Increased pain- a temporary increase in pain a day or two following your procedure is not unusual.  Take your pain medication as prescribed by your referring physician.   Leg cramps  Please contact our office at [phone] for the following symptoms:  Fever greater than 100 degrees.  Headaches unresolved with medication after 2-3 days.  Increased swelling, pain, or redness at injection site.  Thank you for visiting our office.   may resume coumadin today.

## 2014-02-12 ENCOUNTER — Encounter: Payer: Self-pay | Admitting: Cardiology

## 2014-02-12 ENCOUNTER — Ambulatory Visit (INDEPENDENT_AMBULATORY_CARE_PROVIDER_SITE_OTHER): Payer: Medicare Other | Admitting: Cardiology

## 2014-02-12 VITALS — BP 136/69 | HR 86 | Ht 69.0 in | Wt 166.0 lb

## 2014-02-12 DIAGNOSIS — I4891 Unspecified atrial fibrillation: Secondary | ICD-10-CM

## 2014-02-12 DIAGNOSIS — I2510 Atherosclerotic heart disease of native coronary artery without angina pectoris: Secondary | ICD-10-CM

## 2014-02-12 NOTE — Assessment & Plan Note (Signed)
Continue digoxin and Coumadin. I did mention that if he feels more palpitations or sense that heart rate is faster, we could always try to increase the dose or perhaps consider a low-dose beta blocker.

## 2014-02-12 NOTE — Progress Notes (Signed)
Clinical Summary Mr. Bodey is an 80 y.o.male last seen in January of this year, a former patient of Dr. Lia Foyer. He was referred at that time for followup ischemic testing, reporting palpitation symptoms that reminded him of prior ischemic symptoms with CAD.  Lexiscan Cardiolite showed no diagnostic ST segment abnormalities, soft tissue attenuation without definitive ischemia, LVEF 77%. We discussed the results today. He tells me he has been doing better, less sense of palpitations, no chest pain or nitroglycerin use.  Echocardiogram from July 2013 revealed mild LVH with LVEF 55-60%, mild aortic regurgitation, moderately dilated left and right atrium.   Allergies  Allergen Reactions  . Sulfonamide Derivatives     Patient says his wife says he's allergic to sulfa.    Current Outpatient Prescriptions  Medication Sig Dispense Refill  . ALPRAZolam (XANAX) 0.5 MG tablet TAKE ONE TABLET TWICE DAILY  60 tablet  5  . alum & mag hydroxide-simeth (MYLANTA) 932-671-24 MG/5ML suspension Take 10 mLs by mouth as needed.        Marland Kitchen atorvastatin (LIPITOR) 20 MG tablet Take 20 mg by mouth daily.        . cholecalciferol (VITAMIN D) 1000 UNITS tablet Take 1,000 Units by mouth daily.      . digoxin (LANOXIN) 0.125 MG tablet TAKE 0.5 TABLETS (0.0625 MG TOTAL) BY   MOUTH DAILY  30 tablet  6  . hydrocortisone 2.5 % cream Apply 1 application topically as needed.       . magnesium oxide (MAG-OX 400) 400 MG tablet Take 1 tablet (400 mg total) by mouth every 7 (seven) days.      . pantoprazole (PROTONIX) 40 MG tablet Take 40 mg by mouth 2 (two) times daily.       Marland Kitchen warfarin (COUMADIN) 3 MG tablet as directed.       . nitroGLYCERIN (NITROSTAT) 0.4 MG SL tablet Place 1 tablet (0.4 mg total) under the tongue 3 (three) times daily before meals.  100 tablet  0   No current facility-administered medications for this visit.    Past Medical History  Diagnosis Date  . Mixed hyperlipidemia   . Coronary  atherosclerosis of native coronary artery     Prior stenting of LAD and diagonal at [facility] with subsequent SVG to diagonal, LVEF 55-60%  . Atrial fibrillation   . History of prostate cancer   . GERD   . Brain mass   . Dysphagia   . Weight loss   . Hiccups     Past Surgical History  Procedure Laterality Date  . Coronary artery bypass graft  1996     Emergent SVG to diagonal at [facility]  . Bilateral inguinal hernia repair    . Esophagogastroduodenoscopy      w/esophageal dilation   . Colonoscopy  06/2010  . Upper gastrointestinal endoscopy  06/2010    Social History Mr. Vigo reports that he has never smoked. He has never used smokeless tobacco. Mr. Mizrahi reports that he does not drink alcohol.  Review of Systems Negative except as outlined.  Physical Examination Filed Vitals:   02/12/14 1349  BP: 136/69  Pulse: 86   Filed Weights   02/12/14 1349  Weight: 166 lb (75.297 kg)    Patient appears comfortable at rest.  HEENT: Conjunctiva and lids normal, oropharynx clear.  Neck: Supple, no elevated JVP or carotid bruits, no thyromegaly.  Lungs: Clear to auscultation, nonlabored breathing at rest.  Cardiac: Irregular, no S3, Soft systolic murmur, no pericardial rub.  Abdomen: Soft, nontender,bowel sounds present, no guarding or rebound.  Extremities: No pitting edema, distal pulses 2+.  Skin: Warm and dry.  Musculoskeletal: Mild kyphosis.  Neuropsychiatric: Alert and oriented x3, affect grossly appropriate.   Problem List and Plan   Coronary atherosclerosis of native coronary artery Symptomatically stable at this point. Recent Cardiolite was overall low risk. We will continue medical therapy and observation, followup arranged in 6 months.  ATRIAL FIBRILLATION, CHRONIC Continue digoxin and Coumadin. I did mention that if he feels more palpitations or sense that heart rate is faster, we could always try to increase the dose or perhaps consider a low-dose beta  blocker.    Satira Sark, M.D., F.A.C.C.

## 2014-02-12 NOTE — Patient Instructions (Signed)
Your physician recommends that you schedule a follow-up appointment in: 6 months with Dr McDowell You will receive a reminder letter two months in advance reminding you to call and schedule your appointment. If you don't receive this letter, please contact our office.  Your physician recommends that you continue on your current medications as directed. Please refer to the Current Medication list given to you today.   

## 2014-02-12 NOTE — Assessment & Plan Note (Signed)
Symptomatically stable at this point. Recent Cardiolite was overall low risk. We will continue medical therapy and observation, followup arranged in 6 months.

## 2014-04-14 ENCOUNTER — Other Ambulatory Visit: Payer: Self-pay | Admitting: Pulmonary Disease

## 2014-04-14 DIAGNOSIS — M545 Low back pain, unspecified: Secondary | ICD-10-CM

## 2014-04-14 DIAGNOSIS — M549 Dorsalgia, unspecified: Secondary | ICD-10-CM

## 2014-04-20 ENCOUNTER — Ambulatory Visit
Admission: RE | Admit: 2014-04-20 | Discharge: 2014-04-20 | Disposition: A | Discharge status: Home / Self Care | Payer: Medicare Other | Source: Ambulatory Visit | Attending: Pulmonary Disease | Admitting: Pulmonary Disease

## 2014-04-20 DIAGNOSIS — M549 Dorsalgia, unspecified: Secondary | ICD-10-CM

## 2014-04-20 DIAGNOSIS — M545 Low back pain, unspecified: Secondary | ICD-10-CM

## 2014-04-20 MED ORDER — IOHEXOL 180 MG/ML  SOLN
1.0000 mL | Freq: Once | INTRAMUSCULAR | Status: AC | PRN
  Administered 2014-04-20: 1 mL via EPIDURAL

## 2014-04-20 MED ORDER — METHYLPREDNISOLONE ACETATE 40 MG/ML INJ SUSP (RADIOLOG
120.0000 mg | Freq: Once | INTRAMUSCULAR | Status: AC
  Administered 2014-04-20: 120 mg via EPIDURAL

## 2014-04-20 NOTE — Discharge Instructions (Signed)
Post Procedure Spinal Discharge Instruction Sheet  1. You may resume a regular diet and any medications that you routinely take (including pain medications).  2. No driving day of procedure.  3. Light activity throughout the rest of the day.  Do not do any strenuous work, exercise, bending or lifting.  The day following the procedure, you can resume normal physical activity but you should refrain from exercising or physical therapy for at least three days thereafter.   Common Side Effects:   Headaches- take your usual medications as directed by your physician.  Increase your fluid intake.  Caffeinated beverages may be helpful.  Lie flat in bed until your headache resolves.   Restlessness or inability to sleep- you may have trouble sleeping for the next few days.  Ask your referring physician if you need any medication for sleep.   Facial flushing or redness- should subside within a few days.   Increased pain- a temporary increase in pain a day or two following your procedure is not unusual.  Take your pain medication as prescribed by your referring physician.   Leg cramps  Please contact our office at [phone] for the following symptoms:  Fever greater than 100 degrees.  Headaches unresolved with medication after 2-3 days.  Increased swelling, pain, or redness at injection site.  Thank you for visiting our office.   May resume coumadin today. 

## 2014-05-18 ENCOUNTER — Other Ambulatory Visit (INDEPENDENT_AMBULATORY_CARE_PROVIDER_SITE_OTHER): Payer: Self-pay | Admitting: Internal Medicine

## 2014-06-23 ENCOUNTER — Other Ambulatory Visit: Payer: Self-pay | Admitting: Pulmonary Disease

## 2014-06-23 DIAGNOSIS — M542 Cervicalgia: Secondary | ICD-10-CM

## 2014-06-23 DIAGNOSIS — M545 Low back pain: Secondary | ICD-10-CM

## 2014-07-02 ENCOUNTER — Ambulatory Visit
Admission: RE | Admit: 2014-07-02 | Discharge: 2014-07-02 | Disposition: A | Discharge status: Home / Self Care | Payer: Medicare Other | Source: Ambulatory Visit | Attending: Pulmonary Disease | Admitting: Pulmonary Disease

## 2014-07-02 DIAGNOSIS — M542 Cervicalgia: Secondary | ICD-10-CM

## 2014-07-02 DIAGNOSIS — M545 Low back pain: Secondary | ICD-10-CM

## 2014-07-02 MED ORDER — IOHEXOL 180 MG/ML  SOLN
1.0000 mL | Freq: Once | INTRAMUSCULAR | Status: AC | PRN
  Administered 2014-07-02: 1 mL via EPIDURAL

## 2014-07-02 MED ORDER — METHYLPREDNISOLONE ACETATE 40 MG/ML INJ SUSP (RADIOLOG
120.0000 mg | Freq: Once | INTRAMUSCULAR | Status: AC
  Administered 2014-07-02: 120 mg via EPIDURAL

## 2014-07-02 NOTE — Discharge Instructions (Signed)

## 2014-08-16 ENCOUNTER — Other Ambulatory Visit (INDEPENDENT_AMBULATORY_CARE_PROVIDER_SITE_OTHER): Payer: Self-pay | Admitting: Internal Medicine

## 2014-08-25 ENCOUNTER — Ambulatory Visit (INDEPENDENT_AMBULATORY_CARE_PROVIDER_SITE_OTHER): Payer: Medicare Other | Admitting: Cardiology

## 2014-08-25 ENCOUNTER — Encounter: Payer: Self-pay | Admitting: Cardiology

## 2014-08-25 VITALS — BP 118/64 | HR 60 | Ht 69.0 in | Wt 171.0 lb

## 2014-08-25 DIAGNOSIS — Z23 Encounter for immunization: Secondary | ICD-10-CM

## 2014-08-25 DIAGNOSIS — E782 Mixed hyperlipidemia: Secondary | ICD-10-CM

## 2014-08-25 DIAGNOSIS — I2510 Atherosclerotic heart disease of native coronary artery without angina pectoris: Secondary | ICD-10-CM

## 2014-08-25 DIAGNOSIS — I482 Chronic atrial fibrillation, unspecified: Secondary | ICD-10-CM

## 2014-08-25 NOTE — Patient Instructions (Signed)
Your physician wants you to follow-up in: 6 months You will receive a reminder letter in the mail two months in advance. If you don't receive a letter, please call our office to schedule the follow-up appointment.     Your physician recommends that you continue on your current medications as directed. Please refer to the Current Medication list given to you today.      Thank you for choosing [facility] !        

## 2014-08-25 NOTE — Progress Notes (Signed)
Reason for visit: CAD, hypertension, hyperlipidemia  Clinical Summary [name] is an 80 y.o.male last seen in April. He is here with his wife today. Denies any angina symptoms or increasing shortness of breath. Does have chronic back pain and has leg weakness sometimes when he stands up. No dizziness or syncope. Also mild ankle edema that is dependent in nature.  He had recent lab work done through the [facility] hospital medical system. Lab work from September showed hemoglobin 13.3, platelets 163, BUN 27, creatinine 1.2, normal LFTs, cholesterol 161, triglycerides 95, HDL 48, and LDL 94. He continues on Lipitor without obvious side effects.  Lexiscan Cardiolite from January of this year showed no diagnostic ST segment abnormalities, soft tissue attenuation without definitive ischemia, LVEF 77%.   Allergies  Allergen Reactions  . Sulfonamide Derivatives     Patient says his wife says he's allergic to sulfa.    Current Outpatient Prescriptions  Medication Sig Dispense Refill  . ALPRAZolam (XANAX) 0.5 MG tablet TAKE ONE TABLET TWICE DAILY  60 tablet  5  . alum & mag hydroxide-simeth (MYLANTA) 741-287-86 MG/5ML suspension Take 10 mLs by mouth as needed.        Marland Kitchen atorvastatin (LIPITOR) 20 MG tablet Take 20 mg by mouth daily.        . cholecalciferol (VITAMIN D) 1000 UNITS tablet Take 1,000 Units by mouth daily.      . digoxin (LANOXIN) 0.125 MG tablet TAKE 0.5 TABLETS (0.0625 MG TOTAL) BY   MOUTH DAILY  30 tablet  6  . hydrocortisone 2.5 % cream Apply 1 application topically as needed.       . magnesium oxide (MAG-OX 400) 400 MG tablet Take 1 tablet (400 mg total) by mouth every 7 (seven) days.      . nitroGLYCERIN (NITROSTAT) 0.4 MG SL tablet Place 1 tablet (0.4 mg total) under the tongue 3 (three) times daily before meals.  100 tablet  0  . pantoprazole (PROTONIX) 40 MG tablet TAKE ONE TABLET TWICE DAILY  60 tablet  3  . warfarin (COUMADIN) 3 MG tablet as directed.        No current  facility-administered medications for this visit.    Past Medical History  Diagnosis Date  . Mixed hyperlipidemia   . Coronary atherosclerosis of native coronary artery     Prior stenting of LAD and diagonal at [facility] with subsequent SVG to diagonal, LVEF 55-60%  . Atrial fibrillation   . History of prostate cancer   . GERD   . Brain mass   . Dysphagia   . Weight loss   . Hiccups     Past Surgical History  Procedure Laterality Date  . Coronary artery bypass graft  1996     Emergent SVG to diagonal at [facility]  . Bilateral inguinal hernia repair    . Esophagogastroduodenoscopy      w/esophageal dilation   . Colonoscopy  06/2010  . Upper gastrointestinal endoscopy  06/2010    Social History Mr. Rahal reports that he has never smoked. He has never used smokeless tobacco. Mr. Hyun reports that he does not drink alcohol.  Review of Systems Complete review of systems negative except as otherwise outlined in the clinical summary and also the following. Stable appetite. No reported bleeding episodes.  Physical Examination Filed Vitals:   08/25/14 1455  BP: 118/64  Pulse: 60   Filed Weights   08/25/14 1455  Weight: 171 lb (77.565 kg)    Patient appears comfortable at  rest.  HEENT: Conjunctiva and lids normal, oropharynx clear.  Neck: Supple, no elevated JVP or carotid bruits, no thyromegaly.  Lungs: Clear to auscultation, nonlabored breathing at rest.  Cardiac: Irregular, no S3, Soft systolic murmur, no pericardial rub.  Abdomen: Soft, nontender,bowel sounds present, no guarding or rebound.  Extremities: Mild ankle edema, distal pulses 2+.  Skin: Warm and dry.  Musculoskeletal: Mild kyphosis.  Neuropsychiatric: Alert and oriented x3, affect grossly appropriate.   Problem List and Plan   Coronary atherosclerosis of native coronary artery Symptomatically stable on medical therapy. Cardiolite from earlier this year was overall low risk. Continue observation.  Mixed  hyperlipidemia Recent LDL 94 on Lipitor. No changes made today.  ATRIAL FIBRILLATION, CHRONIC Continues on Coumadin, heart rate adequately controlled.    Satira Sark, M.D., F.A.C.C.

## 2014-08-25 NOTE — Assessment & Plan Note (Signed)
Continues on Coumadin, heart rate adequately controlled.

## 2014-08-25 NOTE — Assessment & Plan Note (Signed)
Recent LDL 94 on Lipitor. No changes made today.

## 2014-08-25 NOTE — Assessment & Plan Note (Signed)
Symptomatically stable on medical therapy. Cardiolite from earlier this year was overall low risk. Continue observation.

## 2014-08-26 ENCOUNTER — Encounter: Payer: Self-pay | Admitting: Cardiology

## 2014-09-14 ENCOUNTER — Other Ambulatory Visit (HOSPITAL_COMMUNITY): Payer: Self-pay | Admitting: Respiratory Therapy

## 2014-09-14 DIAGNOSIS — G4730 Sleep apnea, unspecified: Secondary | ICD-10-CM

## 2014-09-15 ENCOUNTER — Ambulatory Visit: Payer: Medicare Other | Attending: Pulmonary Disease | Admitting: Sleep Medicine

## 2014-09-15 DIAGNOSIS — G4730 Sleep apnea, unspecified: Secondary | ICD-10-CM

## 2014-09-15 DIAGNOSIS — R400 Somnolence: Secondary | ICD-10-CM | POA: Insufficient documentation

## 2014-09-15 DIAGNOSIS — I4891 Unspecified atrial fibrillation: Secondary | ICD-10-CM | POA: Insufficient documentation

## 2014-09-15 DIAGNOSIS — Z79899 Other long term (current) drug therapy: Secondary | ICD-10-CM | POA: Insufficient documentation

## 2014-09-18 NOTE — Sleep Study (Signed)
  [place] A. Merlene Laughter, MD     [email or web address]        NOCTURNAL POLYSOMNOGRAM    LOCATION: SLEEP LAB FACILITY: [facility]   PHYSICIAN: Greta Yung A. Merlene Laughter, M.D.   DATE OF STUDY: 09/15/2014.   REFERRING PHYSICIAN: Sinda Du.   INDICATIONS: The patient is an 80 year old presents with difficulty with sleep initiation and maintenance. Additionally, there is drowsiness and daytime.  MEDICATIONS:  Prior to Admission medications   Medication Sig Start Date End Date Taking? Authorizing Provider  ALPRAZolam Duanne Moron) 0.5 MG tablet TAKE ONE TABLET TWICE DAILY 08/16/14   Rogene Houston, MD  alum & mag hydroxide-simeth (MYLANTA) 200-200-20 MG/5ML suspension Take 10 mLs by mouth as needed.      Historical Provider, MD  atorvastatin (LIPITOR) 20 MG tablet Take 20 mg by mouth daily.      Historical Provider, MD  cholecalciferol (VITAMIN D) 1000 UNITS tablet Take 1,000 Units by mouth daily.    Historical Provider, MD  digoxin (LANOXIN) 0.125 MG tablet TAKE 0.5 TABLETS (0.0625 MG TOTAL) BY   MOUTH DAILY 09/21/13   Satira Sark, MD  hydrocortisone 2.5 % cream Apply 1 application topically as needed.  12/01/12   Historical Provider, MD  magnesium oxide (MAG-OX 400) 400 MG tablet Take 1 tablet (400 mg total) by mouth every 7 (seven) days. 01/13/13   Rogene Houston, MD  nitroGLYCERIN (NITROSTAT) 0.4 MG SL tablet Place 1 tablet (0.4 mg total) under the tongue 3 (three) times daily before meals. 02/15/13   Rogene Houston, MD  pantoprazole (PROTONIX) 40 MG tablet TAKE ONE TABLET TWICE DAILY    [name], NP  warfarin (COUMADIN) 3 MG tablet as directed.  03/09/11   Historical Provider, MD      EPWORTH SLEEPINESS SCALE: 7.   BMI: 24.   ARCHITECTURAL SUMMARY: Total recording time was 431 minutes. Sleep efficiency 45 %. Sleep latency 35 minutes. REM latency 221 minutes. Stage NI 5 %, N2 88 % and N3 0 % and REM sleep 7 %. Additionally, Delrae Alfred intrusion and alpha delta sleep  are observed.   RESPIRATORY DATA:  Baseline oxygen saturation is 98 %. The lowest saturation is 92 %. The diagnostic AHI is 0. The RDI is 0. The REM AHI is 0.  LIMB MOVEMENT SUMMARY: PLM index 14.   ELECTROCARDIOGRAM SUMMARY: Average heart rate is 77 with intermittent atrial fibrillation observed.   IMPRESSION:  1. Abnormal sleep architecture with poor sleep efficiency, absent slow-wave sleep and alpha delta sleep. Alpha delta sleep can be associated with chronic pain disorders and nonrestorative sleep. 2. Intermittent atrial fibrillation.  Thanks for this referral.  Keller Mikels A. Merlene Laughter, M.D. Diplomat, Tax adviser of Sleep Medicine.

## 2014-10-05 ENCOUNTER — Ambulatory Visit (INDEPENDENT_AMBULATORY_CARE_PROVIDER_SITE_OTHER): Payer: Medicare Other | Admitting: Urology

## 2014-10-05 DIAGNOSIS — C61 Malignant neoplasm of prostate: Secondary | ICD-10-CM

## 2014-10-25 ENCOUNTER — Ambulatory Visit (HOSPITAL_COMMUNITY)
Admission: RE | Admit: 2014-10-25 | Discharge: 2014-10-25 | Disposition: A | Discharge status: Home / Self Care | Payer: Medicare Other | Source: Ambulatory Visit | Attending: Pulmonary Disease | Admitting: Pulmonary Disease

## 2014-10-25 ENCOUNTER — Other Ambulatory Visit (HOSPITAL_COMMUNITY): Payer: Self-pay | Admitting: Pulmonary Disease

## 2014-10-25 DIAGNOSIS — R05 Cough: Secondary | ICD-10-CM | POA: Diagnosis not present

## 2014-10-25 DIAGNOSIS — R059 Cough, unspecified: Secondary | ICD-10-CM

## 2014-10-25 DIAGNOSIS — I771 Stricture of artery: Secondary | ICD-10-CM | POA: Diagnosis not present

## 2014-10-25 DIAGNOSIS — R918 Other nonspecific abnormal finding of lung field: Secondary | ICD-10-CM | POA: Insufficient documentation

## 2014-10-25 DIAGNOSIS — R0602 Shortness of breath: Secondary | ICD-10-CM | POA: Insufficient documentation

## 2014-10-28 ENCOUNTER — Other Ambulatory Visit: Payer: Self-pay | Admitting: Pulmonary Disease

## 2014-10-28 DIAGNOSIS — G8929 Other chronic pain: Secondary | ICD-10-CM

## 2014-10-28 DIAGNOSIS — M545 Low back pain, unspecified: Secondary | ICD-10-CM

## 2014-11-03 ENCOUNTER — Other Ambulatory Visit (HOSPITAL_COMMUNITY): Payer: Self-pay | Admitting: Pulmonary Disease

## 2014-11-03 DIAGNOSIS — R109 Unspecified abdominal pain: Secondary | ICD-10-CM

## 2014-11-04 ENCOUNTER — Telehealth (INDEPENDENT_AMBULATORY_CARE_PROVIDER_SITE_OTHER): Payer: Self-pay | Admitting: *Deleted

## 2014-11-04 DIAGNOSIS — Z7901 Long term (current) use of anticoagulants: Secondary | ICD-10-CM | POA: Diagnosis not present

## 2014-11-04 NOTE — Telephone Encounter (Signed)
Patient states that both Dr.Rehman and Dr.Hawkins had arranged a Barium Swallow test,and he does not feel that he needs it. Patient states that he has to much mucous in his throat,especially in the mornings. This has been going on 6 months,Dr.Hawkins gave him a medicine but it did not help.  We currently do not have patient posted.  Per Dr.Rehman the patient has a motility problem and he had advised the patient of this. If the patient does not want this, that is fine , we can see him in the office. The patient will be called and made aware.

## 2014-11-04 NOTE — Telephone Encounter (Signed)
Talked with the patient and he has agreed to precede with the Barium Swallow.

## 2014-11-05 ENCOUNTER — Ambulatory Visit
Admission: RE | Admit: 2014-11-05 | Discharge: 2014-11-05 | Disposition: A | Discharge status: Home / Self Care | Payer: Medicare Other | Source: Ambulatory Visit | Attending: Pulmonary Disease | Admitting: Pulmonary Disease

## 2014-11-05 ENCOUNTER — Other Ambulatory Visit (HOSPITAL_COMMUNITY): Payer: Self-pay

## 2014-11-05 DIAGNOSIS — M5416 Radiculopathy, lumbar region: Secondary | ICD-10-CM | POA: Diagnosis not present

## 2014-11-05 DIAGNOSIS — M545 Low back pain: Principal | ICD-10-CM

## 2014-11-05 DIAGNOSIS — G8929 Other chronic pain: Secondary | ICD-10-CM

## 2014-11-05 MED ORDER — IOHEXOL 180 MG/ML  SOLN
1.0000 mL | Freq: Once | INTRAMUSCULAR | Status: AC | PRN
Start: 2014-11-05 — End: 2014-11-05
  Administered 2014-11-05: 1 mL via EPIDURAL

## 2014-11-05 MED ORDER — METHYLPREDNISOLONE ACETATE 40 MG/ML INJ SUSP (RADIOLOG
120.0000 mg | Freq: Once | INTRAMUSCULAR | Status: AC
  Administered 2014-11-05: 120 mg via EPIDURAL

## 2014-11-05 NOTE — Discharge Instructions (Signed)
Post Procedure Spinal Discharge Instruction Sheet  1. You may resume a regular diet and any medications that you routinely take (including pain medications).  2. No driving day of procedure.  3. Light activity throughout the rest of the day.  Do not do any strenuous work, exercise, bending or lifting.  The day following the procedure, you can resume normal physical activity but you should refrain from exercising or physical therapy for at least three days thereafter.   Common Side Effects:   Headaches- take your usual medications as directed by your physician.  Increase your fluid intake.  Caffeinated beverages may be helpful.  Lie flat in bed until your headache resolves.   Restlessness or inability to sleep- you may have trouble sleeping for the next few days.  Ask your referring physician if you need any medication for sleep.   Facial flushing or redness- should subside within a few days.   Increased pain- a temporary increase in pain a day or two following your procedure is not unusual.  Take your pain medication as prescribed by your referring physician.   Leg cramps  Please contact our office at [phone] for the following symptoms:  Fever greater than 100 degrees.  Headaches unresolved with medication after 2-3 days.  Increased swelling, pain, or redness at injection site.  Thank you for visiting our office.  May resume coumadin/warfarin today.

## 2014-11-10 ENCOUNTER — Ambulatory Visit (HOSPITAL_COMMUNITY)
Admission: RE | Admit: 2014-11-10 | Discharge: 2014-11-10 | Disposition: A | Discharge status: Home / Self Care | Payer: Medicare Other | Source: Ambulatory Visit | Attending: Pulmonary Disease | Admitting: Pulmonary Disease

## 2014-11-10 DIAGNOSIS — R1310 Dysphagia, unspecified: Secondary | ICD-10-CM | POA: Insufficient documentation

## 2014-11-10 DIAGNOSIS — R109 Unspecified abdominal pain: Secondary | ICD-10-CM

## 2014-11-13 ENCOUNTER — Other Ambulatory Visit: Payer: Self-pay | Admitting: Cardiology

## 2014-11-16 DIAGNOSIS — H3531 Nonexudative age-related macular degeneration: Secondary | ICD-10-CM | POA: Diagnosis not present

## 2014-11-16 DIAGNOSIS — H35371 Puckering of macula, right eye: Secondary | ICD-10-CM | POA: Diagnosis not present

## 2014-12-01 DIAGNOSIS — Z0000 Encounter for general adult medical examination without abnormal findings: Secondary | ICD-10-CM | POA: Diagnosis not present

## 2014-12-01 DIAGNOSIS — Z7901 Long term (current) use of anticoagulants: Secondary | ICD-10-CM | POA: Diagnosis not present

## 2014-12-06 ENCOUNTER — Ambulatory Visit (INDEPENDENT_AMBULATORY_CARE_PROVIDER_SITE_OTHER): Payer: Medicare Other | Admitting: Internal Medicine

## 2014-12-06 ENCOUNTER — Encounter (INDEPENDENT_AMBULATORY_CARE_PROVIDER_SITE_OTHER): Payer: Self-pay | Admitting: Internal Medicine

## 2014-12-06 VITALS — BP 112/72 | HR 66 | Temp 96.8°F | Resp 18 | Ht 69.0 in | Wt 171.4 lb

## 2014-12-06 DIAGNOSIS — R1314 Dysphagia, pharyngoesophageal phase: Secondary | ICD-10-CM

## 2014-12-06 DIAGNOSIS — R1310 Dysphagia, unspecified: Secondary | ICD-10-CM

## 2014-12-06 DIAGNOSIS — R1319 Other dysphagia: Secondary | ICD-10-CM

## 2014-12-06 DIAGNOSIS — K219 Gastro-esophageal reflux disease without esophagitis: Secondary | ICD-10-CM | POA: Diagnosis not present

## 2014-12-06 DIAGNOSIS — R1313 Dysphagia, pharyngeal phase: Secondary | ICD-10-CM

## 2014-12-06 NOTE — Patient Instructions (Signed)
Speech pathology consultation to further evaluate swallowing difficulty.

## 2014-12-06 NOTE — Progress Notes (Signed)
Presenting complaint;  Swallowing problem.  Subjective:  Patient is a 81 year old Caucasian male was here for scheduled visit accompanied by his wife Romie Minus. He has chronic GERD, dysphagia secondary to nonspecific esophageal motility disorder(esophageal manometry at [facility] in February 2012) and irritable bowel syndrome. He was last seen in March 2015. He presents with worsening swallowing problems. He is having difficulty clearing his throat of saliva and/or postnasal drip. At times it's intractable and quite frustrating for him. He is also having difficulty swallowing liquids or solids this symptom occurs multiple times in a week but not daily. He has moments where he regurgitates food bolus and other times it eventually goes down. He is not having any heartburn. In spite of his symptoms he has not lost any weight. In fact he's gained 8 pounds since his last visit almost 10 months ago. He had a barium pill esophagogram about 3 weeks ago Revealing disruption of primary peristaltic waves with full column stasis of contrast to the level of cervical esophagus along with severe spasm and mid and distal esophagus. Barium pill was not administered. He also has history of irritable bowel syndrome and he seemed to get some benefit with alprazolam. He still has diarrhea and/or constipation and on other days stool is normal. He denies melena or rectal bleeding. Last colonoscopy was in September 2011 revealing external hemorrhoids and radiation proctitis. Last EGD was also in September 2011. It was a normal exam esophagus was dilated by passing 56 [place] Maloney dilator providing temporary relief of dysphagia.   Current Medications: Outpatient Encounter Prescriptions as of 12/06/2014  Medication Sig  . ALPRAZolam (XANAX) 0.5 MG tablet TAKE ONE TABLET TWICE DAILY  . alum & mag hydroxide-simeth (MYLANTA) 902-409-73 MG/5ML suspension Take 10 mLs by mouth as needed.    Marland Kitchen atorvastatin (LIPITOR) 20 MG tablet Take 20  mg by mouth daily.    . cholecalciferol (VITAMIN D) 1000 UNITS tablet Take 1,000 Units by mouth daily.  . digoxin (LANOXIN) 0.125 MG tablet TAKE ONE-HALF TABLET BY MOUTH ONCE A DAY  . HYDROcodone-acetaminophen (NORCO/VICODIN) 5-325 MG per tablet Take 1 tablet by mouth as needed for moderate pain.  . hydrocortisone 2.5 % cream Apply 1 application topically as needed.   . magnesium oxide (MAG-OX 400) 400 MG tablet Take 1 tablet (400 mg total) by mouth every 7 (seven) days.  . pantoprazole (PROTONIX) 40 MG tablet TAKE ONE TABLET TWICE DAILY  . warfarin (COUMADIN) 3 MG tablet as directed.   . [DISCONTINUED] nitroGLYCERIN (NITROSTAT) 0.4 MG SL tablet Place 1 tablet (0.4 mg total) under the tongue 3 (three) times daily before meals. (Patient not taking: Reported on 12/06/2014)     Objective: Blood pressure 112/72, pulse 66, temperature 96.8 F (36 C), temperature source Oral, resp. rate 18, height 5\' 9"  (1.753 m), weight 171 lb 6.4 oz (77.747 kg). Patient is alert and in no acute distress. Conjunctiva is pink. Sclera is nonicteric Oropharyngeal mucosa is normal. No neck masses or thyromegaly noted. Cardiac exam with irregular rhythm normal S1 and S2. No murmur or gallop noted. Lungs are clear to auscultation. Abdomen symmetrical and soft with mild midepigastric tenderness. No organomegaly or masses noted.  No LE edema or clubbing noted.  Labs/studies Results: Barium study from 11/10/2014 as well as 02/12/2013 reviewed with patient and his wife.    Assessment:  #1. Patient's symptoms are suggestive of both pharyngeal as well as esophageal dysphagia. Last esophageal manometry was in September 2011 revealing nonspecific motility disorder with 30% of swallows  revealing normal peristalsis, 50% revealed simultaneous peristalsis and 20% swallows revealed failed peristalsis. This study did not show nutcracker or jackhammer esophagus. Current barium study suggests diffuse problem rather than distal  dysmotility. He might benefit from low-dose nitrates. He also needs to undergo evaluation by speech pathologist to assess pharyngeal function. Heartburn appears to be well controlled with therapy.  Plan:  Speech pathology consultation with Mrs. Genene Churn, SLLP. Patient will return for office visit in 8 weeks.

## 2014-12-07 DIAGNOSIS — Z1211 Encounter for screening for malignant neoplasm of colon: Secondary | ICD-10-CM | POA: Diagnosis not present

## 2014-12-17 ENCOUNTER — Other Ambulatory Visit: Payer: Self-pay | Admitting: Pulmonary Disease

## 2014-12-17 DIAGNOSIS — M545 Low back pain, unspecified: Secondary | ICD-10-CM

## 2014-12-17 DIAGNOSIS — G8929 Other chronic pain: Secondary | ICD-10-CM

## 2014-12-23 ENCOUNTER — Ambulatory Visit
Admission: RE | Admit: 2014-12-23 | Discharge: 2014-12-23 | Disposition: A | Discharge status: Home / Self Care | Payer: Medicare Other | Source: Ambulatory Visit | Attending: Pulmonary Disease | Admitting: Pulmonary Disease

## 2014-12-23 ENCOUNTER — Other Ambulatory Visit: Payer: Self-pay | Admitting: Pulmonary Disease

## 2014-12-23 DIAGNOSIS — M545 Low back pain: Principal | ICD-10-CM

## 2014-12-23 DIAGNOSIS — M5416 Radiculopathy, lumbar region: Secondary | ICD-10-CM | POA: Diagnosis not present

## 2014-12-23 DIAGNOSIS — Z7901 Long term (current) use of anticoagulants: Secondary | ICD-10-CM | POA: Diagnosis not present

## 2014-12-23 DIAGNOSIS — G8929 Other chronic pain: Secondary | ICD-10-CM

## 2014-12-23 MED ORDER — IOHEXOL 180 MG/ML  SOLN
1.0000 mL | Freq: Once | INTRAMUSCULAR | Status: AC | PRN
Start: 2014-12-23 — End: 2014-12-23
  Administered 2014-12-23: 1 mL via EPIDURAL

## 2014-12-23 MED ORDER — METHYLPREDNISOLONE ACETATE 40 MG/ML INJ SUSP (RADIOLOG
120.0000 mg | Freq: Once | INTRAMUSCULAR | Status: AC
  Administered 2014-12-23: 120 mg via EPIDURAL

## 2014-12-23 NOTE — Discharge Instructions (Signed)

## 2014-12-29 ENCOUNTER — Ambulatory Visit (HOSPITAL_COMMUNITY): Payer: Medicare Other | Attending: Internal Medicine | Admitting: Speech Pathology

## 2014-12-29 ENCOUNTER — Ambulatory Visit (HOSPITAL_COMMUNITY)
Admission: RE | Admit: 2014-12-29 | Discharge: 2014-12-29 | Disposition: A | Discharge status: Home / Self Care | Payer: Medicare Other | Source: Ambulatory Visit | Attending: Internal Medicine | Admitting: Internal Medicine

## 2014-12-29 DIAGNOSIS — R1319 Other dysphagia: Secondary | ICD-10-CM

## 2014-12-29 DIAGNOSIS — R1314 Dysphagia, pharyngoesophageal phase: Secondary | ICD-10-CM | POA: Insufficient documentation

## 2014-12-29 DIAGNOSIS — R1310 Dysphagia, unspecified: Secondary | ICD-10-CM | POA: Diagnosis not present

## 2014-12-29 DIAGNOSIS — K219 Gastro-esophageal reflux disease without esophagitis: Secondary | ICD-10-CM | POA: Diagnosis not present

## 2014-12-29 DIAGNOSIS — R1312 Dysphagia, oropharyngeal phase: Secondary | ICD-10-CM | POA: Insufficient documentation

## 2014-12-29 DIAGNOSIS — R1313 Dysphagia, pharyngeal phase: Secondary | ICD-10-CM

## 2014-12-29 DIAGNOSIS — K224 Dyskinesia of esophagus: Secondary | ICD-10-CM | POA: Diagnosis not present

## 2014-12-31 NOTE — Therapy (Signed)
[place], [place], [address] Phone: [phone]   Fax:  [phone]  Modified Barium Swallow  Patient Details  Name: Sergio Boyle MRN: [id number] Date of Birth: 09/04/1928 Referring Provider:  Rogene Houston, MD  Encounter Date: 12/29/2014      End of Session - 12/29/14 1016    Visit Number 1   Number of Visits 1   Authorization Type [employer]   SLP Start Time 3151   SLP Stop Time  7616   SLP Time Calculation (min) 39 min   Activity Tolerance Patient tolerated treatment well      Past Medical History  Diagnosis Date  . Mixed hyperlipidemia   . Coronary atherosclerosis of native coronary artery     Prior stenting of LAD and diagonal at [facility] with subsequent SVG to diagonal, LVEF 55-60%  . Atrial fibrillation   . History of prostate cancer   . GERD   . Brain mass   . Dysphagia   . Weight loss   . Hiccups     Past Surgical History  Procedure Laterality Date  . Coronary artery bypass graft  1996     Emergent SVG to diagonal at [facility]  . Bilateral inguinal hernia repair    . Esophagogastroduodenoscopy      w/esophageal dilation   . Colonoscopy  06/2010  . Upper gastrointestinal endoscopy  06/2010    There were no vitals taken for this visit.  Visit Diagnosis: Dysphagia, pharyngoesophageal phase      Subjective Assessment - 12/29/14 0959    Symptoms "I always have this phlegm in my throat."   Special Tests MBSS   Currently in Pain? No/denies             General - 12/29/14 1000    General Information   Date of Onset 11/10/14   HPI Sergio Boyle is an 81 year old male who was referred by Dr. Laural Golden for MBSS due to known esophageal dysphagia with suspected pharyngeal component. His PCP is Dr. Luan Pulling. Sergio Boyle has chronic GERD, dysphagia secondary to nonspecific esophageal motility disorder(esophageal manometry at [facility] in February 2012) and irritable bowel syndrome. He is having  difficulty clearing his throat of saliva and/or postnasal drip. At times it's intractable and quite frustrating for him. He is also having difficulty swallowing liquids or solids this symptom occurs multiple times in a week but not daily. He has moments where he regurgitates food bolus and other times it eventually goes down. He is not reporting any heartburn. In spite of his symptoms he has not experienced any weight loss (in fact he gained some).   He had a barium pill esophagogram 11/10/2014 which showed: severe esophageal dysmotility. There is disruption of all primary esophageal peristaltic waves with full column stasis of contrast to the cervical esophagus. Severe spasm in the mid and distal esophagus. The distal esophagus never distends well, likely related to this severe spasm. A 13 mm barium tablet was not administered due to the severe spasm and complete stasis into the cervical esophagus. He also has history of irritable bowel syndrome and he seemed to get some benefit with alprazolam. He still has diarrhea and/or constipation and on other days stool is normal. Patient's symptoms are suggestive of both pharyngeal as well as esophageal dysphagia. Last esophageal manometry was in September 2011 revealing nonspecific motility disorder with 30% of swallows revealing normal peristalsis, 50% revealed simultaneous peristalsis and 20% swallows revealed failed peristalsis. This study  did not show nutcracker esophagus. Last EGD was also in September 2011. It was a normal exam esophagus was dilated by passing 56 [place] Maloney dilator providing temporary relief of dysphagia.   Chest x-ray from 09/2014 showed: Coarse interstitial opacities left greater than right lower lungs may represent scarring and/or fibrotic change. In the acute setting, superimposed acute infectious process is not excluded.   Type of Study Modified Barium Swallowing Study   Reason for Referral Objectively evaluate swallowing function    Previous Swallow Assessment see above; no MBSS on file   Diet Prior to this Study Regular;Thin liquids   Temperature Spikes Noted No   Respiratory Status Room air   History of Recent Intubation No   Behavior/Cognition Alert;Cooperative;Pleasant mood   Oral Cavity - Dentition Adequate natural dentition   Oral Motor / Sensory Function Within functional limits   Self-Feeding Abilities Able to feed self   Patient Positioning Upright in chair   Baseline Vocal Quality Clear   Volitional Cough Strong   Volitional Swallow Able to elicit   Anatomy Within functional limits   Pharyngeal Secretions Not observed secondary MBS            Oral Preparation/Oral Phase - 12/29/14 1001    Oral Preparation/Oral Phase   Oral Phase WFL   Electrical stimulation - Oral Phase   Was Electrical Stimulation Used No          Pharyngeal Phase - 12/29/14 1001    Pharyngeal Phase   Pharyngeal Phase Impaired   Pharyngeal - Thin   Pharyngeal - Thin Cup Pharyngeal residue - valleculae;Pharyngeal residue - pyriform sinuses  initially no residue but increased with additional presentation   Pharyngeal - Thin Straw Pharyngeal residue - valleculae;Pharyngeal residue - pyriform sinuses  mild residuals in valleculae, mi/mod pyriforms   Pharyngeal - Solids   Pharyngeal - Puree Pharyngeal residue - valleculae;Pharyngeal residue - pyriform sinuses  first presentation ok, residuals with subsequent presentations   Pharyngeal - Regular Pharyngeal residue - valleculae;Pharyngeal residue - pyriform sinuses   Pharyngeal - Pill Pharyngeal residue - valleculae  retained in valleculae for several swallows   Pharyngeal Phase - Comment   Pharyngeal Comment Initial presentations yielded little vallecular and pyriform residue, but residuals increased with subsequent swallows (esophageal sweep revealed barium-filled esophagus)   Electrical Stimulation - Pharyngeal Phase   Was Electrical Stimulation Used No           Cricopharyngeal Phase - 12/29/14 1011    Cervical Esophageal Phase   Cervical Esophageal Phase Impaired   Cervical Esophageal Phase - Thin   Thin Straw Esophageal backflow into the pharynx   Cervical Esophageal Phase - Comment   Cervical Esophageal Comment At the completion of study in lateral view, pt was turned AP and cued to drink sequential straw sips thin barium which resulted in backflow into pharynx                  Plan - 12/29/14 1037    Clinical Impression Statement Sergio Boyle pharyngeal function is negatively impacted by decreased clearance of barium/po in esophagus. Unfortunately, Sergio Boyle has severe esophageal dysmotility (esophageal sweep revealed full column stasis of contrast to the cervical esophagus with spasms in mid/distal esophagus) which negatively impacts pharyngeal pressure resulting in initial swallows with little pharyngeal residue, but significant pyriform residue with subsequent swallows (suspect as esophagus fills up). Swallow initiation is timely and hyolaryngeal excursion appears adequate. No penetration or aspiration observed. Pt was able to reduce moderate pyriform residuals  to min with repeat/dry swallows.   Pt was given barium pill (12.29mm) towards the end of the study (ie. after liquids, puree, and solids) and it became transiently delayed in the valleculae, but cleared after repeated swallows of thin. The pill then traversed through barium filled esophagus and was retained in distal esophagus. Pt was turned AP and took sequential straw sips thin barium which was observed to pass through UES and then backflow into pharynx (this is likely the regurgitation that pt describes).   Interestingly, pt does not report globus sensation, heartburn, or feeling of early satiety despite significant retention in esophagus. He was able to sense when barium tablet was transiently delayed in valleculae. Pt's primary complaints seem to be inability to clear what he  perceives as "phlegm".   The study was reviewed with Sergio Boyle and he was encouraged to consume a mechanical soft diet with no liquid restrictions, eat frequent/smaller meals, crush or break large pills in small amount puree as able (ok for small pills whole with water), chew foods thoroughly, swallow 2x for each bite/sip, and implement strict reflux precautions (remain upright for 30-60" after po intake, elevate HOB). He is at risk for aspiration given severity of esophageal dysphagia and retention in pyriforms post swallow and as meal progresses.   Consulted and Agree with Plan of Care Patient            Recommendations/Treatment - 12/29/14 1013    Swallow Evaluation Recommendations   Diet Recommendations Dysphagia 3 (Mechanical Soft);Thin liquid   Liquid Administration via Cup;Straw   Medication Administration Whole meds with liquid  crush or break large pills as able   Supervision Patient able to self feed   Compensations Small sips/bites;Multiple dry swallows after each bite/sip;Follow solids with liquid   Postural Changes and/or Swallow Maneuvers Seated upright 90 degrees;Upright 30-60 min after meal   Oral Care Recommendations Oral care BID;Patient independent with oral care   Other Recommendations Clarify dietary restrictions   Follow up Recommendations None  follow up with Dr. Laural Golden          Prognosis - 12/29/14 1015    Prognosis   Prognosis for Safe Diet Advancement Guarded   Barriers to Reach Goals Severity of dysphagia   Barriers/Prognosis Comment severe esophageal dysmotility negatively impacts safe and efficient pharyngeal function (at risk for aspiration due to residuals in pyriforms)   Individuals Consulted   Consulted and Agree with Results and Recommendations Patient   Report Sent to  Referring physician  pt request to be sent to PCP, Dr. Luan Pulling      Problem List Patient Active Problem List   Diagnosis Date Noted  . LBP (low back pain) 01/27/2014  .  Chronic neck pain 01/27/2014  . Esophageal motility disorder 12/25/2011  . Coronary atherosclerosis of native coronary artery 05/09/2010  . Mixed hyperlipidemia 07/20/2009  . ATRIAL FIBRILLATION, CHRONIC 07/20/2009  . GERD 07/20/2009   Thank you,  Genene Churn, [place]  [facility] 12/29/2014, 4:30 PM  [place] [address] [place], [place], [address] Phone: [phone]   Fax:  [phone]

## 2015-01-17 ENCOUNTER — Ambulatory Visit (INDEPENDENT_AMBULATORY_CARE_PROVIDER_SITE_OTHER): Payer: Medicare Other | Admitting: Internal Medicine

## 2015-01-18 DIAGNOSIS — Z7901 Long term (current) use of anticoagulants: Secondary | ICD-10-CM | POA: Diagnosis not present

## 2015-02-08 ENCOUNTER — Encounter (INDEPENDENT_AMBULATORY_CARE_PROVIDER_SITE_OTHER): Payer: Self-pay | Admitting: Internal Medicine

## 2015-02-08 ENCOUNTER — Ambulatory Visit (INDEPENDENT_AMBULATORY_CARE_PROVIDER_SITE_OTHER): Payer: Medicare Other | Admitting: Internal Medicine

## 2015-02-08 VITALS — BP 116/70 | HR 64 | Temp 97.4°F | Resp 18 | Ht 69.0 in | Wt 171.1 lb

## 2015-02-08 DIAGNOSIS — K219 Gastro-esophageal reflux disease without esophagitis: Secondary | ICD-10-CM

## 2015-02-08 DIAGNOSIS — K224 Dyskinesia of esophagus: Secondary | ICD-10-CM

## 2015-02-08 DIAGNOSIS — R1313 Dysphagia, pharyngeal phase: Secondary | ICD-10-CM

## 2015-02-08 MED ORDER — ISOSORBIDE MONONITRATE 10 MG PO TABS: 5.0000 mg | ORAL_TABLET | Freq: Two times a day (BID) | ORAL | Status: DC

## 2015-02-08 MED ORDER — ALPRAZOLAM 0.25 MG PO TABS: 0.2500 mg | ORAL_TABLET | Freq: Two times a day (BID) | ORAL | Status: DC

## 2015-02-08 MED ORDER — ALPRAZOLAM 0.5 MG PO TABS: 0.5000 mg | ORAL_TABLET | Freq: Two times a day (BID) | ORAL | Status: DC

## 2015-02-08 MED ORDER — ISOSORBIDE MONONITRATE 10 MG PO TABS: 5.0000 mg | ORAL_TABLET | Freq: Three times a day (TID) | ORAL | Status: DC

## 2015-02-08 MED ORDER — PANTOPRAZOLE SODIUM 40 MG PO TBEC: 40.0000 mg | DELAYED_RELEASE_TABLET | Freq: Every day | ORAL | Status: DC

## 2015-02-08 NOTE — Patient Instructions (Signed)
Decrease alprazolam dose as follows. 0.25 mg twice daily for 2 weeks; 0.25 mg once daily for 2 weeks. 0.25 mg daily as needed. Call office if you have any side effects with Ismo

## 2015-02-08 NOTE — Progress Notes (Signed)
Presenting complaint;  Follow-up or swallowing problems.  Subjective:  Patient is 81 year old Caucasian male who is here for scheduled visit accompanied by his wife Romie Minus. He was last seen 2 months ago. He was felt to have both pharyngeal and esophageal problems. He had barium study prior to his last visit. He was referred to Ms. Genene Churn, SLP for evaluation. He says he does not feel any better. His biggest concern is that he cannot get her to phlegm for from his throat. As for as his dysphagia is concerned he has good and bad days. He does not feel that he is any better or worse than before. He has more problem with solids and liquids. He does not have heartburn often. He is taking pantoprazole on as-needed basis. He has chronic low back pain and not able to do much walking or exercise. He denies abdominal pain. He is having 3-4 bowel movements per day. He denies nocturnal diarrhea. His wife states that he goes to sleep with food in his mouth and she is very concerned. Dr. Luan Pulling obtain sleep study this apparently was normal. Patient is reluctant to come off alprazolam.   Current Medications: Outpatient Encounter Prescriptions as of 02/08/2015  Medication Sig  . ALPRAZolam (XANAX) 0.5 MG tablet TAKE ONE TABLET TWICE DAILY  . alum & mag hydroxide-simeth (MYLANTA) 937-169-67 MG/5ML suspension Take 10 mLs by mouth as needed.    Marland Kitchen atorvastatin (LIPITOR) 20 MG tablet Take 20 mg by mouth daily.    . cholecalciferol (VITAMIN D) 1000 UNITS tablet Take 1,000 Units by mouth daily.  . digoxin (LANOXIN) 0.125 MG tablet TAKE ONE-HALF TABLET BY MOUTH ONCE A DAY  . HYDROcodone-acetaminophen (NORCO/VICODIN) 5-325 MG per tablet Take 1 tablet by mouth as needed for moderate pain.  . hydrocortisone 2.5 % cream Apply 1 application topically as needed.   . magnesium oxide (MAG-OX 400) 400 MG tablet Take 1 tablet (400 mg total) by mouth every 7 (seven) days.  . pantoprazole (PROTONIX) 40 MG tablet TAKE ONE  TABLET TWICE DAILY  . warfarin (COUMADIN) 3 MG tablet Take 3 mg by mouth as directed.      Objective: Blood pressure 116/70, pulse 64, temperature 97.4 F (36.3 C), temperature source Oral, resp. rate 18, height 5\' 9"  (1.753 m), weight 171 lb 1.6 oz (77.61 kg). Patient is alert and in no acute distress. He is very quiet. Conjunctiva is pink. Sclera is nonicteric Oropharyngeal mucosa is normal. No neck masses or thyromegaly noted. Cardiac exam with regular rhythm normal S1 and S2. No murmur or gallop noted. Lungs are clear to auscultation. Abdomen is full. On palpation is soft and nontender without organomegaly or masses.  No LE edema or clubbing noted.    Assessment:  #1. Pharyngeal dysphagia. He is not feeling any better. He may benefit from having oropharyngeal suction set up at home. Patient is going to sleep with food in his mouth. I doubt that alprazolam is the cause. Patient can be weaned off this medication when he is ready. #2. Esophageal dysphagia secondary to esophageal motility disorder. He is maintaining his weight. Options are limited. Patient has been on alprazolam because of his dysphagia and it has helped somewhat in the past. #3. GERD. He needs to be taking PPI every morning. #4. Irritable bowel syndrome.   Plan:  Patient advised to take pantoprazole 40 mg by mouth every morning. Prescription given for oropharyngeal suction set up. Office visit in 2 months.

## 2015-02-14 ENCOUNTER — Encounter (INDEPENDENT_AMBULATORY_CARE_PROVIDER_SITE_OTHER): Payer: Self-pay | Admitting: *Deleted

## 2015-02-16 DIAGNOSIS — Z7901 Long term (current) use of anticoagulants: Secondary | ICD-10-CM | POA: Diagnosis not present

## 2015-02-22 ENCOUNTER — Encounter: Payer: Self-pay | Admitting: Cardiology

## 2015-02-22 ENCOUNTER — Ambulatory Visit (INDEPENDENT_AMBULATORY_CARE_PROVIDER_SITE_OTHER): Payer: Medicare Other | Admitting: Cardiology

## 2015-02-22 VITALS — BP 104/58 | HR 89 | Ht 69.0 in | Wt 170.0 lb

## 2015-02-22 DIAGNOSIS — I2510 Atherosclerotic heart disease of native coronary artery without angina pectoris: Secondary | ICD-10-CM

## 2015-02-22 DIAGNOSIS — I482 Chronic atrial fibrillation, unspecified: Secondary | ICD-10-CM

## 2015-02-22 DIAGNOSIS — E782 Mixed hyperlipidemia: Secondary | ICD-10-CM | POA: Diagnosis not present

## 2015-02-22 NOTE — Progress Notes (Signed)
Cardiology Office Note  Date: 02/22/2015   ID: Sergio Boyle, DOB 11/29/1927, MRN [id number]  PCP: Alonza Bogus, MD  Primary Cardiologist: Rozann Lesches, MD   Chief Complaint  Patient presents with  . Coronary Artery Disease  . Atrial Fibrillation    History of Present Illness: Sergio Boyle is an 81 y.o. male last seen in October 2015. He presents with his wife today for a routine visit. No new complaints from a cardiac perspective, not endorsing any angina or progressive shortness of breath. He is not aware of any sense of palpitations. Continues on Coumadin, followed by Dr. Luan Pulling.  I reviewed his remaining medications which are outlined below. Follow-up tracing shows rate-controlled atrial fibrillation with right bundle branch block.  His most recent ischemic evaluation was last January.   Past Medical History  Diagnosis Date  . Mixed hyperlipidemia   . Coronary atherosclerosis of native coronary artery     Prior stenting of LAD and diagonal at [facility] with subsequent SVG to diagonal, LVEF 55-60%  . Atrial fibrillation   . History of prostate cancer   . GERD   . Brain mass   . Dysphagia   . Weight loss   . Hiccups     Past Surgical History  Procedure Laterality Date  . Coronary artery bypass graft  1996     Emergent SVG to diagonal at [facility]  . Bilateral inguinal hernia repair    . Esophagogastroduodenoscopy      w/esophageal dilation   . Colonoscopy  06/2010  . Upper gastrointestinal endoscopy  06/2010    Current Outpatient Prescriptions  Medication Sig Dispense Refill  . ALPRAZolam (XANAX) 0.5 MG tablet Take 1 tablet (0.5 mg total) by mouth 2 (two) times daily. 60 tablet 2  . alum & mag hydroxide-simeth (MYLANTA) 209-470-96 MG/5ML suspension Take 10 mLs by mouth as needed.      Marland Kitchen atorvastatin (LIPITOR) 20 MG tablet Take 20 mg by mouth daily.      . cholecalciferol (VITAMIN D) 1000 UNITS tablet Take 1,000 Units by mouth daily.    . digoxin (LANOXIN) 0.125  MG tablet TAKE ONE-HALF TABLET BY MOUTH ONCE A DAY 30 tablet 11  . HYDROcodone-acetaminophen (NORCO/VICODIN) 5-325 MG per tablet Take 1 tablet by mouth as needed for moderate pain.    . hydrocortisone 2.5 % cream Apply 1 application topically as needed.     . magnesium oxide (MAG-OX 400) 400 MG tablet Take 1 tablet (400 mg total) by mouth every 7 (seven) days.    . pantoprazole (PROTONIX) 40 MG tablet Take 1 tablet (40 mg total) by mouth daily before breakfast. 30 tablet 5  . warfarin (COUMADIN) 3 MG tablet Take 3 mg by mouth as directed.      No current facility-administered medications for this visit.    Allergies:  Sulfonamide derivatives   Social History: The patient  reports that he has never smoked. He has never used smokeless tobacco. He reports that he does not drink alcohol or use illicit drugs.    ROS:  Please see the history of present illness. Otherwise, complete review of systems is positive for none.  All other systems are reviewed and negative.   Physical Exam: VS:  BP 104/58 mmHg  Pulse 89  Ht 5\' 9"  (1.753 m)  Wt 170 lb (77.111 kg)  BMI 25.09 kg/m2  SpO2 99%, BMI Body mass index is 25.09 kg/(m^2).  Wt Readings from Last 3 Encounters:  02/22/15 170 lb (77.111 kg)  02/08/15 171 lb 1.6 oz (77.61 kg)  12/06/14 171 lb 6.4 oz (77.747 kg)     Patient appears comfortable at rest.  HEENT: Conjunctiva and lids normal, oropharynx clear.  Neck: Supple, no elevated JVP or carotid bruits, no thyromegaly.  Lungs: Clear to auscultation, nonlabored breathing at rest.  Cardiac: Irregular, no S3, Soft systolic murmur, no pericardial rub.  Abdomen: Soft, nontender,bowel sounds present, no guarding or rebound.  Extremities: Mild ankle edema, distal pulses 2+.  Skin: Warm and dry.  Musculoskeletal: Mild kyphosis.  Neuropsychiatric: Alert and oriented x3, affect grossly appropriate.   ECG: ECG is ordered today.  Other Studies Reviewed Today:  Lexiscan Cardiolite from  January 2015 showed no diagnostic ST segment abnormalities, soft tissue attenuation without definitive ischemia, LVEF 77%.  ASSESSMENT AND PLAN:  1. Symptomatically stable CAD status post percutaneous LAD and diagonal intervention with subsequent CABG. Ischemic workup negative from last January. Continue observation.  2. Hyperlipidemia, on Lipitor. Continue follow-up with Dr. Luan Pulling.  3. Chronic atrial fibrillation, symptomatically stable on Coumadin and Lanoxin.  Current medicines were reviewed at length with the patient today.   Orders Placed This Encounter  Procedures  . EKG 12-Lead    Disposition: FU with me in 6 months.   Signed, Satira Sark, MD, [facility] 02/22/2015 2:34 PM    [place] [facility] [address], [place], [place] [address] Phone: [phone]; Fax: [phone]

## 2015-02-22 NOTE — Patient Instructions (Signed)
Your physician wants you to follow-up in: 6 months with Dr.McDowell You will receive a reminder letter in the mail two months in advance. If you don't receive a letter, please call our office to schedule the follow-up appointment.     Your physician recommends that you continue on your current medications as directed. Please refer to the Current Medication list given to you today.     Thank you for choosing [facility] !        

## 2015-03-03 DIAGNOSIS — Z7901 Long term (current) use of anticoagulants: Secondary | ICD-10-CM | POA: Diagnosis not present

## 2015-03-10 DIAGNOSIS — Z7901 Long term (current) use of anticoagulants: Secondary | ICD-10-CM | POA: Diagnosis not present

## 2015-04-11 ENCOUNTER — Encounter (INDEPENDENT_AMBULATORY_CARE_PROVIDER_SITE_OTHER): Payer: Self-pay | Admitting: Internal Medicine

## 2015-04-11 ENCOUNTER — Telehealth (INDEPENDENT_AMBULATORY_CARE_PROVIDER_SITE_OTHER): Payer: Self-pay | Admitting: Internal Medicine

## 2015-04-11 ENCOUNTER — Ambulatory Visit (INDEPENDENT_AMBULATORY_CARE_PROVIDER_SITE_OTHER): Payer: Medicare Other | Admitting: Internal Medicine

## 2015-04-11 VITALS — BP 100/64 | HR 64 | Temp 97.6°F | Ht 69.0 in | Wt 168.7 lb

## 2015-04-11 DIAGNOSIS — K224 Dyskinesia of esophagus: Secondary | ICD-10-CM

## 2015-04-11 DIAGNOSIS — R1314 Dysphagia, pharyngoesophageal phase: Secondary | ICD-10-CM

## 2015-04-11 NOTE — Patient Instructions (Addendum)
OV in 4 months. Avoid steaks.

## 2015-04-11 NOTE — Telephone Encounter (Signed)
Dr. Laural Golden, patient at [facility] wants to know if he can be dilated again. I told him I would ask you and see what your thoughts were. I know he has esophageal spasms and esophageal dysmotility

## 2015-04-11 NOTE — Progress Notes (Addendum)
Subjective:    Patient ID: Sergio Boyle, male    DOB: Oct 31, 1927, 81 y.o.   MRN: [id number]  HPI Here today for f/u of his swallowing difficulties. He was last seen by Dr. Laural Golden 2 months ago. He tells me his swallowing is about the same.  He is on a regular diet.  He says after he eats, the food is slow to go down, but will go down.  He says he drinks about a pint of water with each meal. Appetite is good. He has lost a couple of pounds since his last visit.  He has gone thru a sleep study about a year ago.  He denies choking. He did not get the suction machine. He clears his throat about 30 minutes by coughing.  He has a BM daily with a stool softener. No melena or BRRB.    11/10/2014 DG Esophagram: Dysphagia: IMPRESSION: Severe dysmotility with full column stasis into the cervical esophagus and severe mid to distal esophageal spasm.  Last colonoscopy was in September 2011 revealing external hemorrhoids and radiation proctitis. Last EGD was also in September 2011. It was a normal exam esophagus was dilated by passing 56 [place] Maloney dilator providing temporary relief of dysphagia.  01/01/2015 Modified Barium Swallow:  impacted by decreased clearance of barium/po in esophagus. Unfortunately, Mr. Sanger has severe esophageal dysmotility (esophageal sweep revealed full column stasis of contrast to the cervical esophagus with spasms in mid/distal esophagus) which negatively impacts pharyngeal pressure resulting in initial swallows with little pharyngeal residue, but significant pyriform residue with subsequent swallows (suspect as esophagus fills up). Swallow initiation is timely and hyolaryngeal excursion appears adequate. No penetration or aspiration observed. Pt was able to reduce moderate pyriform residuals to min with repeat/dry swallows.    06/13/2011 Esophageal Manometry: [facility]: Impression: The function of the LES is within normal limits. There were an elevated number of simultaneous  swallows and poor bolus transit via impedance. Given the elevated CFV with greater than 20% swallows, this pattern is consistent with distal esophageal spasm. Review of Systems Past Medical History  Diagnosis Date  . Mixed hyperlipidemia   . Coronary atherosclerosis of native coronary artery     Prior stenting of LAD and diagonal at [facility] with subsequent SVG to diagonal, LVEF 55-60%  . Atrial fibrillation   . History of prostate cancer   . GERD   . Brain mass   . Dysphagia   . Weight loss   . Hiccups     Past Surgical History  Procedure Laterality Date  . Coronary artery bypass graft  1996     Emergent SVG to diagonal at [facility]  . Bilateral inguinal hernia repair    . Esophagogastroduodenoscopy      w/esophageal dilation   . Colonoscopy  06/2010  . Upper gastrointestinal endoscopy  06/2010    Allergies  Allergen Reactions  . Sulfonamide Derivatives     Patient says his wife says he's allergic to sulfa.    Current Outpatient Prescriptions on File Prior to Visit  Medication Sig Dispense Refill  . ALPRAZolam (XANAX) 0.5 MG tablet Take 1 tablet (0.5 mg total) by mouth 2 (two) times daily. 60 tablet 2  . alum & mag hydroxide-simeth (MYLANTA) 637-858-85 MG/5ML suspension Take 10 mLs by mouth as needed.      Marland Kitchen atorvastatin (LIPITOR) 20 MG tablet Take 20 mg by mouth daily.      . cholecalciferol (VITAMIN D) 1000 UNITS tablet Take 1,000 Units by mouth daily.    Marland Kitchen  digoxin (LANOXIN) 0.125 MG tablet TAKE ONE-HALF TABLET BY MOUTH ONCE A DAY 30 tablet 11  . HYDROcodone-acetaminophen (NORCO/VICODIN) 5-325 MG per tablet Take 1 tablet by mouth as needed for moderate pain.    . hydrocortisone 2.5 % cream Apply 1 application topically as needed.     . magnesium oxide (MAG-OX 400) 400 MG tablet Take 1 tablet (400 mg total) by mouth every 7 (seven) days.    . pantoprazole (PROTONIX) 40 MG tablet Take 1 tablet (40 mg total) by mouth daily before breakfast. 30 tablet 5  . warfarin (COUMADIN) 3 MG  tablet Take 3 mg by mouth as directed.      No current facility-administered medications on file prior to visit.        Objective:   Physical ExamBlood pressure 100/64, pulse 64, temperature 97.6 F (36.4 C), height 5\' 9"  (1.753 m), weight 168 lb 11.2 oz (76.522 kg).  Alert and oriented. Skin warm and dry. Oral mucosa is moist.   . Sclera anicteric, conjunctivae is pink. Thyroid not enlarged. No cervical lymphadenopathy. Lungs clear. Heart regular rate and rhythm.  Abdomen is soft. Bowel sounds are positive. No hepatomegaly. No abdominal masses felt. No tenderness.  No edema to lower extremities.         Assessment & Plan:  Esohpphageal dismotility: He has lost 2 pounds since his last visit. I encourage him to drink lots of fluids. Chew foods well. Small portions. OV in 4 months. I sent Dr. Laural Golden a note ? EGD/ED.

## 2015-04-20 DIAGNOSIS — Z7901 Long term (current) use of anticoagulants: Secondary | ICD-10-CM | POA: Diagnosis not present

## 2015-05-05 ENCOUNTER — Other Ambulatory Visit: Payer: Self-pay | Admitting: Family Medicine

## 2015-05-05 ENCOUNTER — Other Ambulatory Visit: Payer: Self-pay | Admitting: Pulmonary Disease

## 2015-05-05 DIAGNOSIS — M549 Dorsalgia, unspecified: Secondary | ICD-10-CM

## 2015-05-05 DIAGNOSIS — M542 Cervicalgia: Secondary | ICD-10-CM

## 2015-05-05 DIAGNOSIS — G8929 Other chronic pain: Secondary | ICD-10-CM

## 2015-05-17 DIAGNOSIS — H3531 Nonexudative age-related macular degeneration: Secondary | ICD-10-CM | POA: Diagnosis not present

## 2015-05-17 DIAGNOSIS — H35362 Drusen (degenerative) of macula, left eye: Secondary | ICD-10-CM | POA: Diagnosis not present

## 2015-05-17 DIAGNOSIS — Z7901 Long term (current) use of anticoagulants: Secondary | ICD-10-CM | POA: Diagnosis not present

## 2015-05-17 DIAGNOSIS — H35371 Puckering of macula, right eye: Secondary | ICD-10-CM | POA: Diagnosis not present

## 2015-05-18 ENCOUNTER — Ambulatory Visit
Admission: RE | Admit: 2015-05-18 | Discharge: 2015-05-18 | Disposition: A | Discharge status: Home / Self Care | Payer: Medicare Other | Source: Ambulatory Visit | Attending: Pulmonary Disease | Admitting: Pulmonary Disease

## 2015-05-18 DIAGNOSIS — M542 Cervicalgia: Secondary | ICD-10-CM | POA: Diagnosis not present

## 2015-05-18 DIAGNOSIS — M549 Dorsalgia, unspecified: Secondary | ICD-10-CM

## 2015-05-18 DIAGNOSIS — G8929 Other chronic pain: Secondary | ICD-10-CM

## 2015-05-18 MED ORDER — IOHEXOL 300 MG/ML  SOLN
1.0000 mL | Freq: Once | INTRAMUSCULAR | Status: AC | PRN
  Administered 2015-05-18: 1 mL via EPIDURAL

## 2015-05-18 MED ORDER — TRIAMCINOLONE ACETONIDE 40 MG/ML IJ SUSP (RADIOLOGY)
60.0000 mg | Freq: Once | INTRAMUSCULAR | Status: AC
  Administered 2015-05-18: 60 mg via EPIDURAL

## 2015-05-18 NOTE — Discharge Instructions (Signed)

## 2015-05-23 ENCOUNTER — Telehealth (INDEPENDENT_AMBULATORY_CARE_PROVIDER_SITE_OTHER): Payer: Self-pay | Admitting: *Deleted

## 2015-05-23 NOTE — Telephone Encounter (Signed)
Patient saw Karna Christmas in June. Patient has been waiting to hear if Dr.Rehman was going to do EGD/ED/ Spoke with Dr.Rehman - he states that he will do one , however , patient will need to be off Warfarin for 3 days or bridge over. He ask that patient's PCP be contacted.  Patient will be called and told that Lelon Frohlich will call him back with instruction about procedure.

## 2015-05-24 ENCOUNTER — Ambulatory Visit (INDEPENDENT_AMBULATORY_CARE_PROVIDER_SITE_OTHER): Payer: Medicare Other | Admitting: Urology

## 2015-05-24 ENCOUNTER — Other Ambulatory Visit (INDEPENDENT_AMBULATORY_CARE_PROVIDER_SITE_OTHER): Payer: Self-pay | Admitting: *Deleted

## 2015-05-24 DIAGNOSIS — R3915 Urgency of urination: Secondary | ICD-10-CM

## 2015-05-24 DIAGNOSIS — C61 Malignant neoplasm of prostate: Secondary | ICD-10-CM | POA: Diagnosis not present

## 2015-05-24 DIAGNOSIS — K224 Dyskinesia of esophagus: Secondary | ICD-10-CM

## 2015-05-24 DIAGNOSIS — R1310 Dysphagia, unspecified: Secondary | ICD-10-CM

## 2015-05-24 NOTE — Telephone Encounter (Signed)
EGD/ED sch'd 06/03/15 at 250 (150), ok to stop coumadin 5 days prior per Dr Luan Pulling, left detailed message regarding instructions for patient

## 2015-05-26 DIAGNOSIS — N138 Other obstructive and reflux uropathy: Secondary | ICD-10-CM | POA: Diagnosis not present

## 2015-05-26 DIAGNOSIS — M545 Low back pain: Secondary | ICD-10-CM | POA: Diagnosis not present

## 2015-05-26 DIAGNOSIS — I4891 Unspecified atrial fibrillation: Secondary | ICD-10-CM | POA: Diagnosis not present

## 2015-05-26 DIAGNOSIS — M542 Cervicalgia: Secondary | ICD-10-CM | POA: Diagnosis not present

## 2015-05-27 DIAGNOSIS — M542 Cervicalgia: Secondary | ICD-10-CM | POA: Diagnosis not present

## 2015-05-27 DIAGNOSIS — N138 Other obstructive and reflux uropathy: Secondary | ICD-10-CM | POA: Diagnosis not present

## 2015-05-27 DIAGNOSIS — I4891 Unspecified atrial fibrillation: Secondary | ICD-10-CM | POA: Diagnosis not present

## 2015-05-27 DIAGNOSIS — M545 Low back pain: Secondary | ICD-10-CM | POA: Diagnosis not present

## 2015-06-10 ENCOUNTER — Encounter (HOSPITAL_COMMUNITY): Payer: Self-pay | Admitting: *Deleted

## 2015-06-10 ENCOUNTER — Encounter (HOSPITAL_COMMUNITY)
Admission: RE | Disposition: A | Discharge status: Home / Self Care | Payer: Self-pay | Source: Ambulatory Visit | Attending: Internal Medicine

## 2015-06-10 ENCOUNTER — Ambulatory Visit (HOSPITAL_COMMUNITY)
Admission: RE | Admit: 2015-06-10 | Discharge: 2015-06-10 | Disposition: A | Discharge status: Home / Self Care | Payer: Medicare Other | Source: Ambulatory Visit | Attending: Internal Medicine | Admitting: Internal Medicine

## 2015-06-10 DIAGNOSIS — K3189 Other diseases of stomach and duodenum: Secondary | ICD-10-CM | POA: Diagnosis not present

## 2015-06-10 DIAGNOSIS — K224 Dyskinesia of esophagus: Secondary | ICD-10-CM

## 2015-06-10 DIAGNOSIS — G8929 Other chronic pain: Secondary | ICD-10-CM | POA: Insufficient documentation

## 2015-06-10 DIAGNOSIS — Z951 Presence of aortocoronary bypass graft: Secondary | ICD-10-CM | POA: Diagnosis not present

## 2015-06-10 DIAGNOSIS — R1313 Dysphagia, pharyngeal phase: Secondary | ICD-10-CM | POA: Diagnosis not present

## 2015-06-10 DIAGNOSIS — M542 Cervicalgia: Secondary | ICD-10-CM | POA: Diagnosis not present

## 2015-06-10 DIAGNOSIS — R1310 Dysphagia, unspecified: Secondary | ICD-10-CM | POA: Diagnosis not present

## 2015-06-10 DIAGNOSIS — I2510 Atherosclerotic heart disease of native coronary artery without angina pectoris: Secondary | ICD-10-CM | POA: Insufficient documentation

## 2015-06-10 DIAGNOSIS — I482 Chronic atrial fibrillation: Secondary | ICD-10-CM | POA: Insufficient documentation

## 2015-06-10 DIAGNOSIS — K219 Gastro-esophageal reflux disease without esophagitis: Secondary | ICD-10-CM | POA: Diagnosis not present

## 2015-06-10 DIAGNOSIS — E782 Mixed hyperlipidemia: Secondary | ICD-10-CM | POA: Insufficient documentation

## 2015-06-10 DIAGNOSIS — Z7901 Long term (current) use of anticoagulants: Secondary | ICD-10-CM | POA: Diagnosis not present

## 2015-06-10 DIAGNOSIS — Z882 Allergy status to sulfonamides status: Secondary | ICD-10-CM | POA: Diagnosis not present

## 2015-06-10 DIAGNOSIS — Z8546 Personal history of malignant neoplasm of prostate: Secondary | ICD-10-CM | POA: Insufficient documentation

## 2015-06-10 HISTORY — PX: ESOPHAGOGASTRODUODENOSCOPY: SHX5428

## 2015-06-10 HISTORY — PX: ESOPHAGEAL DILATION: SHX303

## 2015-06-10 LAB — KOH PREP

## 2015-06-10 SURGERY — EGD (ESOPHAGOGASTRODUODENOSCOPY)
Anesthesia: Moderate Sedation

## 2015-06-10 MED ORDER — SODIUM CHLORIDE 0.9 % IJ SOLN
INTRAMUSCULAR | Status: AC
  Filled 2015-06-10: qty 3

## 2015-06-10 MED ORDER — BUTAMBEN-TETRACAINE-BENZOCAINE 2-2-14 % EX AERO
INHALATION_SPRAY | CUTANEOUS | Status: DC | PRN
Start: 2015-06-10 — End: 2015-06-10
  Administered 2015-06-10: 2 via TOPICAL

## 2015-06-10 MED ORDER — MIDAZOLAM HCL 5 MG/5ML IJ SOLN
INTRAMUSCULAR | Status: DC | PRN
  Administered 2015-06-10: 1 mg via INTRAVENOUS
  Administered 2015-06-10: 2 mg via INTRAVENOUS

## 2015-06-10 MED ORDER — MEPERIDINE HCL 50 MG/ML IJ SOLN
INTRAMUSCULAR | Status: AC
  Filled 2015-06-10: qty 1

## 2015-06-10 MED ORDER — MIDAZOLAM HCL 5 MG/5ML IJ SOLN
INTRAMUSCULAR | Status: AC
  Filled 2015-06-10: qty 10

## 2015-06-10 MED ORDER — STERILE WATER FOR IRRIGATION IR SOLN
Status: DC | PRN
  Administered 2015-06-10: 15:00:00

## 2015-06-10 MED ORDER — SODIUM CHLORIDE 0.9 % IV SOLN
INTRAVENOUS | Status: DC
  Administered 2015-06-10: 1000 mL via INTRAVENOUS

## 2015-06-10 MED ORDER — MEPERIDINE HCL 50 MG/ML IJ SOLN
INTRAMUSCULAR | Status: DC | PRN
  Administered 2015-06-10: 25 mg
  Administered 2015-06-10: 25 mg via INTRAVENOUS

## 2015-06-10 NOTE — H&P (Addendum)
Sergio Boyle is an 81 y.o. male.   Chief Complaint: Patient is  here for EGD and ED. HPI: Issues 81 year old Caucasian male with multiple medical problems including chronic GERD and history of esophageal motility disorder as well as pharyngeal dysphagia who presents with worsening difficulty. He says heartburns well controlled with therapy. He has an episode every once in a while. He is maintaining his weight despite dysphagia. He has undergone speech pathology evaluation the past as well as esophageal manometry. He denies melena or rectal bleeding. He has been off warfarin for 5 days.  Past Medical History  Diagnosis Date  . Mixed hyperlipidemia   . Coronary atherosclerosis of native coronary artery     Prior stenting of LAD and diagonal at [facility] with subsequent SVG to diagonal, LVEF 55-60%  . Atrial fibrillation   . History of prostate cancer   . GERD   . Brain mass   . Dysphagia   . Weight loss   . Hiccups     Past Surgical History  Procedure Laterality Date  . Coronary artery bypass graft  1996     Emergent SVG to diagonal at [facility]  . Bilateral inguinal hernia repair    . Esophagogastroduodenoscopy      w/esophageal dilation   . Colonoscopy  06/2010  . Upper gastrointestinal endoscopy  06/2010    Family History  Problem Relation Age of Onset  . Coronary artery disease Father    Social History:  reports that he has never smoked. He has never used smokeless tobacco. He reports that he does not drink alcohol or use illicit drugs.  Allergies:  Allergies  Allergen Reactions  . Sulfonamide Derivatives     Patient says his wife says he's allergic to sulfa.    Medications Prior to Admission  Medication Sig Dispense Refill  . ALPRAZolam (XANAX) 0.5 MG tablet Take 1 tablet (0.5 mg total) by mouth 2 (two) times daily. 60 tablet 2  . alum & mag hydroxide-simeth (MYLANTA) 621-308-65 MG/5ML suspension Take 10 mLs by mouth 2 (two) times daily as needed for indigestion or heartburn.      Marland Kitchen atorvastatin (LIPITOR) 20 MG tablet Take 20 mg by mouth daily.      . cholecalciferol (VITAMIN D) 1000 UNITS tablet Take 1,000 Units by mouth daily.    . digoxin (LANOXIN) 0.125 MG tablet TAKE ONE-HALF TABLET BY MOUTH ONCE A DAY 30 tablet 11  . magnesium oxide (MAG-OX 400) 400 MG tablet Take 1 tablet (400 mg total) by mouth every 7 (seven) days.    . pantoprazole (PROTONIX) 40 MG tablet Take 1 tablet (40 mg total) by mouth daily before breakfast. 30 tablet 5  . warfarin (COUMADIN) 3 MG tablet Take 3 mg by mouth daily.     Marland Kitchen HYDROcodone-acetaminophen (NORCO/VICODIN) 5-325 MG per tablet Take 1 tablet by mouth as needed for moderate pain.      No results found for this or any previous visit (from the past 48 hour(s)). No results found.  ROS  Blood pressure 152/76, pulse 90, temperature 97.9 F (36.6 C), temperature source Oral, resp. rate 16, height 5\' 9"  (1.753 m), weight 160 lb (72.576 kg), SpO2 100 %. Physical Exam  Constitutional: He appears well-developed and well-nourished.  HENT:  Mouth/Throat: Oropharynx is clear and moist.  Eyes: Conjunctivae are normal. No scleral icterus.  Neck: No thyromegaly present.  Cardiovascular:  Irregular rhythm normal S1 and S2. No murmur or gallop noted.  Respiratory: Effort normal and breath sounds normal.  GI: Soft. He exhibits no distension and no mass. There is no tenderness.  Musculoskeletal: He exhibits no edema.  Lymphadenopathy:    He has no cervical adenopathy.  Neurological: He is alert.  Skin: Skin is warm and dry.     Assessment/Plan Dysphagia. Chronic GERD and history of esophageal motility disorder. EGD with ED.  REHMAN,NAJEEB U 06/10/2015, 2:50 PM

## 2015-06-10 NOTE — Discharge Instructions (Signed)
Resume usual medications including warfarin which can be started this evening at usual dose. Resume usual diet. No driving for 24 hours. Please get INR checked in 7-10 days. Physician will call with results of brushing from esophagus(to check for yeast infection).   Esophagogastroduodenoscopy Care After Refer to this sheet in the next few weeks. These instructions provide you with information on caring for yourself after your procedure. Your caregiver may also give you more specific instructions. Your treatment has been planned according to current medical practices, but problems sometimes occur. Call your caregiver if you have any problems or questions after your procedure.  HOME CARE INSTRUCTIONS  Do not eat or drink anything until the numbing medicine (local anesthetic) has worn off and your gag reflex has returned. You will know that the local anesthetic has worn off when you can swallow comfortably.  Do not drive for 12 hours after the procedure or as directed by your caregiver.  Only take medicines as directed by your caregiver. SEEK MEDICAL CARE IF:   You cannot stop coughing.  You are not urinating at all or less than usual. SEEK IMMEDIATE MEDICAL CARE IF:  You have difficulty swallowing.  You cannot eat or drink.  You have worsening throat or chest pain.  You have dizziness, lightheadedness, or you faint.  You have nausea or vomiting.  You have chills.  You have a fever.  You have severe abdominal pain.  You have black, tarry, or bloody stools. Document Released: 10/01/2012 Document Reviewed: 10/01/2012 [facility] Patient Information 2015 [place]. This information is not intended to replace advice given to you by your health care provider. Make sure you discuss any questions you have with your health care provider.

## 2015-06-10 NOTE — Op Note (Signed)
EGD PROCEDURE REPORT  PATIENT:  Sergio Boyle  MR#:  [id number] Birthdate:  07-17-1928, 81 y.o., male Endoscopist:  Dr. Rogene Houston, MD Referred By:  Dr. Alonza Bogus, MD  Procedure Date: 06/10/2015  Procedure:   EGD with ED  Indications:  Patient is 81 year old Caucasian male who was chronic GERD as well as esophageal motility disorder who presents with worsening dysphagia both to solids as well as liquids. He also has pharyngeal dysphagia for which she's been thoroughly evaluated. He has responded partially to esophageal dilations in the past. Heartburn is well controlled with PPI.            Informed Consent:  The risks, benefits, alternatives & imponderables which include, but are not limited to, bleeding, infection, perforation, drug reaction and potential missed lesion have been reviewed.  The potential for biopsy, lesion removal, esophageal dilation, etc. have also been discussed.  Questions have been answered.  All parties agreeable.  Please see history & physical in medical record for more information.  Medications:  Demerol 25 mg IV Versed 3 mg IV Cetacaine spray topically for oropharyngeal anesthesia  Description of procedure:  The endoscope was introduced through the mouth and advanced to the second portion of the duodenum without difficulty or limitations. The mucosal surfaces were surveyed very carefully during advancement of the scope and upon withdrawal.  Findings:  Esophagus:  There was some patchy coating of mucosa in the proximal esophagus. Mucosa in the mid and distal segment was normal. GE junction was unremarkable. GEJ:  41 cm Stomach:  Stomach was empty and distended very well with insufflation. Folds in the proximal stomach were normal. Examination of mucosa at gastric body, antrum, pyloric channel, and less fundus and cardia was normal. Duodenum:  Normal bulbar and post bulbar mucosa.  Therapeutic/Diagnostic Maneuvers Performed:   Esophagus was dilated by  passing 56 [place] Maloney dilator to full insertion. As the dilator was withdrawn endoscope was passed again and esophageal mucosa examined. Linear mucosal disruption noted just below the UES suggestive of disrupted web. Fresh blood also noted coating the uvular tip secondary to suction. Brushing for KOH prep taken from proximal esophagus  Complications:  None  Impression: No evidence of erosive esophagitis ring or stricture. Focal coating of proximal esophageal mucosa possibly due to mild Candida but could be food debris. 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  Recommendations:  Standard instructions given. Patient will resume warfarin at usual dose starting this evening. I will be contacting him with results of KOH prep. Patient will call office with progress report in one week.   [name] U  06/10/2015  3:23 PM  CC: Dr. Alonza Bogus, MD & Dr. Rayne Du ref. provider found

## 2015-06-14 ENCOUNTER — Encounter (HOSPITAL_COMMUNITY): Payer: Self-pay | Admitting: Internal Medicine

## 2015-06-19 ENCOUNTER — Other Ambulatory Visit (INDEPENDENT_AMBULATORY_CARE_PROVIDER_SITE_OTHER): Payer: Self-pay | Admitting: Internal Medicine

## 2015-06-21 ENCOUNTER — Other Ambulatory Visit (INDEPENDENT_AMBULATORY_CARE_PROVIDER_SITE_OTHER): Payer: Self-pay | Admitting: Internal Medicine

## 2015-06-21 NOTE — Telephone Encounter (Signed)
This has been addressed.

## 2015-06-27 DIAGNOSIS — Z7901 Long term (current) use of anticoagulants: Secondary | ICD-10-CM | POA: Diagnosis not present

## 2015-06-28 DIAGNOSIS — H3531 Nonexudative age-related macular degeneration: Secondary | ICD-10-CM | POA: Diagnosis not present

## 2015-06-28 DIAGNOSIS — Z961 Presence of intraocular lens: Secondary | ICD-10-CM | POA: Diagnosis not present

## 2015-07-14 DIAGNOSIS — Z7901 Long term (current) use of anticoagulants: Secondary | ICD-10-CM | POA: Diagnosis not present

## 2015-08-15 ENCOUNTER — Ambulatory Visit (INDEPENDENT_AMBULATORY_CARE_PROVIDER_SITE_OTHER): Payer: Medicare Other | Admitting: Internal Medicine

## 2015-08-16 ENCOUNTER — Encounter (INDEPENDENT_AMBULATORY_CARE_PROVIDER_SITE_OTHER): Payer: Self-pay | Admitting: Internal Medicine

## 2015-08-16 ENCOUNTER — Ambulatory Visit (INDEPENDENT_AMBULATORY_CARE_PROVIDER_SITE_OTHER): Payer: Medicare Other | Admitting: Internal Medicine

## 2015-08-16 VITALS — BP 110/66 | HR 64 | Temp 97.4°F | Resp 18 | Ht 69.0 in | Wt 169.3 lb

## 2015-08-16 DIAGNOSIS — K219 Gastro-esophageal reflux disease without esophagitis: Secondary | ICD-10-CM | POA: Diagnosis not present

## 2015-08-16 DIAGNOSIS — K224 Dyskinesia of esophagus: Secondary | ICD-10-CM | POA: Diagnosis not present

## 2015-08-16 MED ORDER — ALPRAZOLAM 0.5 MG PO TABS: 0.5000 mg | ORAL_TABLET | Freq: Two times a day (BID) | ORAL | Status: DC

## 2015-08-16 NOTE — Patient Instructions (Signed)
Call if swallowing difficulty worsens. 

## 2015-08-16 NOTE — Progress Notes (Signed)
Presenting complaint;  Follow-up for dysphagia and GERD.  Subjective:  Patient is 81 year old Caucasian male who is here for scheduled visit. He has esophageal motility disorder and chronic GERD. He underwent EGD with ED on 06/10/2015. He was also noted to have mild Candida esophagitis and treated with nystatin. He tells me that his swallowing has improved significantly. He eats slowly and chews his food well. He has not had any episode of food impaction or regurgitation. He states he has postnasal drip and has difficulty clearing his throat. It takes him 30 minutes every morning. He denies regurgitation. He has chronic low back pain. He takes pain pills every day. Bowels move daily. He denies melena or rectal bleeding. He is not having any side effects with alprazolam and he denies problems with imbalance or unsteady gait.   Current Medications: Outpatient Encounter Prescriptions as of 08/16/2015  Medication Sig  . ALPRAZolam (XANAX) 0.5 MG tablet TAKE ONE TABLET TWICE DAILY  . alum & mag hydroxide-simeth (MYLANTA) 200-200-20 MG/5ML suspension Take 10 mLs by mouth 2 (two) times daily as needed for indigestion or heartburn.   Marland Kitchen atorvastatin (LIPITOR) 20 MG tablet Take 20 mg by mouth daily.    . cholecalciferol (VITAMIN D) 1000 UNITS tablet Take 1,000 Units by mouth daily.  . digoxin (LANOXIN) 0.125 MG tablet TAKE ONE-HALF TABLET BY MOUTH ONCE A DAY  . HYDROcodone-acetaminophen (NORCO/VICODIN) 5-325 MG per tablet Take 1 tablet by mouth as needed for moderate pain.  . magnesium oxide (MAG-OX 400) 400 MG tablet Take 1 tablet (400 mg total) by mouth every 7 (seven) days.  Marland Kitchen nystatin (MYCOSTATIN) 100000 UNIT/ML suspension Take 5 mLs by mouth 4 (four) times daily.   . pantoprazole (PROTONIX) 40 MG tablet Take 1 tablet (40 mg total) by mouth daily before breakfast.  . warfarin (COUMADIN) 3 MG tablet Take 3 mg by mouth daily.    No facility-administered encounter medications on file as of  08/16/2015.     Objective: Blood pressure 110/66, pulse 64, temperature 97.4 F (36.3 C), temperature source Oral, resp. rate 18, height 5\' 9"  (1.753 m), weight 169 lb 4.8 oz (76.794 kg). Patient is alert and in no acute distress. Conjunctiva is pink. Sclera is nonicteric Oropharyngeal mucosa is normal. No neck masses or thyromegaly noted. Cardiac exam with irregular rhythm normal S1 and S2. No murmur or gallop noted. Lungs are clear to auscultation. Abdomen is soft and nontender without organomegaly or masses. No LE edema or clubbing noted.    Assessment:  #1. Esophageal motility disorder. Patient underwent esophageal dilation over 2 months ago with symptomatic relief. He remains on alprazolam which has also helped. #2. GERD. Heartburns well controlled with therapy.   Plan:  Patient will continue anti-reflex measures and pantoprazole as before. Patient is aware not to take NSAIDs while he is on anticoagulant. New prescription for alprazolam given for one month with 2 refills. He will call for new prescription in 3 months. Office visit in 6 months unless dysphagia worsens.

## 2015-08-25 ENCOUNTER — Encounter: Payer: Self-pay | Admitting: Cardiology

## 2015-08-25 ENCOUNTER — Ambulatory Visit (HOSPITAL_COMMUNITY)
Admission: RE | Admit: 2015-08-25 | Discharge: 2015-08-25 | Disposition: A | Discharge status: Home / Self Care | Payer: Medicare Other | Source: Ambulatory Visit | Attending: Cardiology | Admitting: Cardiology

## 2015-08-25 ENCOUNTER — Ambulatory Visit (INDEPENDENT_AMBULATORY_CARE_PROVIDER_SITE_OTHER): Payer: Medicare Other | Admitting: Cardiology

## 2015-08-25 VITALS — BP 108/66 | HR 88 | Ht 69.0 in | Wt 168.2 lb

## 2015-08-25 DIAGNOSIS — M25512 Pain in left shoulder: Secondary | ICD-10-CM | POA: Diagnosis not present

## 2015-08-25 DIAGNOSIS — M25551 Pain in right hip: Secondary | ICD-10-CM | POA: Diagnosis not present

## 2015-08-25 DIAGNOSIS — E782 Mixed hyperlipidemia: Secondary | ICD-10-CM

## 2015-08-25 DIAGNOSIS — M533 Sacrococcygeal disorders, not elsewhere classified: Secondary | ICD-10-CM | POA: Insufficient documentation

## 2015-08-25 DIAGNOSIS — I2510 Atherosclerotic heart disease of native coronary artery without angina pectoris: Secondary | ICD-10-CM

## 2015-08-25 DIAGNOSIS — I482 Chronic atrial fibrillation, unspecified: Secondary | ICD-10-CM

## 2015-08-25 DIAGNOSIS — W19XXXS Unspecified fall, sequela: Secondary | ICD-10-CM | POA: Diagnosis not present

## 2015-08-25 DIAGNOSIS — R0781 Pleurodynia: Secondary | ICD-10-CM | POA: Insufficient documentation

## 2015-08-25 DIAGNOSIS — I4891 Unspecified atrial fibrillation: Secondary | ICD-10-CM | POA: Diagnosis not present

## 2015-08-25 NOTE — Patient Instructions (Addendum)
Your physician wants you to follow-up in: 6 months with Dr Ferne Reus will receive a reminder letter in the mail two months in advance. If you don't receive a letter, please call our office to schedule the follow-up appointment.   Your physician recommends that you continue on your current medications as directed. Please refer to the Current Medication list given to you today.    If you need a refill on your cardiac medications before your next appointment, please call your pharmacy.   PLEASE GO TO X-RAY TODAY FOR HIP,SHOULDER AND RIB X-RAYS   Thank you for choosing [facility] !

## 2015-08-25 NOTE — Addendum Note (Signed)
Addended by: Barbarann Ehlers A on: 08/25/2015 02:41 PM   Modules accepted: Orders

## 2015-08-25 NOTE — Progress Notes (Signed)
Cardiology Office Note  Date: 08/25/2015   ID: KYMIR COLES, DOB 1927/11/22, MRN [id number]  PCP: Alonza Bogus, MD  Primary Cardiologist: Rozann Lesches, MD   Chief Complaint  Patient presents with  . Coronary Artery Disease  . Atrial Fibrillation    History of Present Illness: Sergio Boyle is an 81 y.o. male last seen in April. He presents today for a routine cardiac. He reports no angina symptoms or significant palpitations. His cardiac regimen is stable and includes Coumadin and also Lanoxin which he has taken for years and tolerated.  He is here with his wife today. He states that about a week ago he fell, slipped while he was leaning on his bed and fell onto his back. In the subsequent days he experienced pain on the left side of his upper thorax and left shoulder, seemed to be worse after he tried to push himself up with his left arm. He has also been experiencing right hip pain, but is able to walk. He uses a walker. He did not seek medical attention with his primary care provider, states that his symptoms have gotten worse, he has not had any plain films.  He reports no bleeding problems on Coumadin, including around the time of his fall. INR is followed and adjusted by Dr. Luan Pulling.  Last ischemic evaluation was in January 2015 as detailed below, overall low risk with normal LVEF.   Past Medical History  Diagnosis Date  . Mixed hyperlipidemia   . Coronary atherosclerosis of native coronary artery     Prior stenting of LAD and diagonal at [facility] with subsequent SVG to diagonal, LVEF 55-60%  . Atrial fibrillation ([place])   . History of prostate cancer   . GERD   . Brain mass   . Dysphagia   . Weight loss   . Hiccups     Past Surgical History  Procedure Laterality Date  . Coronary artery bypass graft  1996     Emergent SVG to diagonal at [facility]  . Bilateral inguinal hernia repair    . Esophagogastroduodenoscopy      w/esophageal dilation   . Colonoscopy  06/2010   . Upper gastrointestinal endoscopy  06/2010  . Esophagogastroduodenoscopy N/A 06/10/2015    Procedure: ESOPHAGOGASTRODUODENOSCOPY (EGD);  Surgeon: Rogene Houston, MD;  [facility];  Service: Endoscopy;  Laterality: N/A;  245  . Esophageal dilation N/A 06/10/2015    Procedure: ESOPHAGEAL DILATION;  Surgeon: Rogene Houston, MD;  Location: [facility];  Service: Endoscopy;  Laterality: N/A;    Current Outpatient Prescriptions  Medication Sig Dispense Refill  . ALPRAZolam (XANAX) 0.5 MG tablet Take 1 tablet (0.5 mg total) by mouth 2 (two) times daily. 60 tablet 2  . alum & mag hydroxide-simeth (MYLANTA) 193-790-24 MG/5ML suspension Take 10 mLs by mouth 2 (two) times daily as needed for indigestion or heartburn.     Marland Kitchen atorvastatin (LIPITOR) 20 MG tablet Take 20 mg by mouth daily.      . cholecalciferol (VITAMIN D) 1000 UNITS tablet Take 1,000 Units by mouth daily.    . digoxin (LANOXIN) 0.125 MG tablet TAKE ONE-HALF TABLET BY MOUTH ONCE A DAY 30 tablet 11  . HYDROcodone-acetaminophen (NORCO/VICODIN) 5-325 MG per tablet Take 1 tablet by mouth as needed for moderate pain.    . magnesium oxide (MAG-OX 400) 400 MG tablet Take 1 tablet (400 mg total) by mouth every 7 (seven) days.    . pantoprazole (PROTONIX) 40 MG tablet  Take 1 tablet (40 mg total) by mouth daily before breakfast. 30 tablet 5  . warfarin (COUMADIN) 3 MG tablet Take 3 mg by mouth daily.      No current facility-administered medications for this visit.    Allergies:  Sulfonamide derivatives   Social History: The patient  reports that he has never smoked. He has never used smokeless tobacco. He reports that he does not drink alcohol or use illicit drugs.   ROS:  Please see the history of present illness. Otherwise, complete review of systems is positive for recent fall as outlined, no syncope.  All other systems are reviewed and negative.   Physical Exam: VS:  BP 108/66 mmHg  Pulse 88  Ht 5\' 9"  (1.753 m)  Wt 168 lb  3.2 oz (76.295 kg)  BMI 24.83 kg/m2  SpO2 98%, BMI Body mass index is 24.83 kg/(m^2).  Wt Readings from Last 3 Encounters:  08/25/15 168 lb 3.2 oz (76.295 kg)  08/16/15 169 lb 4.8 oz (76.794 kg)  06/10/15 160 lb (72.576 kg)     Elderly male, appears comfortable at rest.  HEENT: Conjunctiva and lids normal, oropharynx clear.  Neck: Supple, no elevated JVP or carotid bruits, no thyromegaly.  Lungs: Clear to auscultation, nonlabored breathing at rest.  Cardiac: Irregular, no S3, Soft systolic murmur, no pericardial rub.  Abdomen: Soft, nontender,bowel sounds present, no guarding or rebound.  Extremities: Mild ankle edema, distal pulses 2+.  Skin: Warm and dry.  Musculoskeletal: Mild kyphosis.  Neuropsychiatric: Alert and oriented x3, affect grossly appropriate.   ECG: ECG is not ordered today.  Other Studies Reviewed Today:  Lexiscan Cardiolite from January 2015 showed no diagnostic ST segment abnormalities, soft tissue attenuation without definitive ischemia, LVEF 77%.  ASSESSMENT AND PLAN:  1. Recent fall as outlined above. He has had worsening left shoulder and left upper thorax as well as right hip discomfort. He has not had this evaluated up to this point, has not seen his primary care provider as yet. We are going to send him to radiology to have a right hip film as well as left shoulder and left rib detail films. We will call him with the results.  2. CAD status post previous LAD and diagonal stenting with subsequent SVG to the diagonal. He is not reporting any progressing angina, and had a low risk are white study last year with normal LVEF.  3. Chronic atrial fibrillation, on Lanoxin long-term and Coumadin which is managed by Dr. Luan Pulling. Heart rate is adequately controlled today and he has not had any significant palpitations.  4. Hyperlipidemia, on Lipitor.  Current medicines were reviewed at length with the patient today.   Orders Placed This Encounter    Procedures  . DG Arthro Hip Right  . DG Shoulder Left  . DG Ribs Unilateral Left    Disposition: FU with me in 6 months.   Signed, Satira Sark, MD, [facility] 08/25/2015 1:19 PM    [place] [facility] [address] [place], [place] [address] Phone: [phone]; Fax: [phone]

## 2015-08-26 ENCOUNTER — Telehealth: Payer: Self-pay

## 2015-08-26 NOTE — Telephone Encounter (Signed)
Wife notified of negative x-rays

## 2015-08-26 NOTE — Telephone Encounter (Signed)
-----   Message from Satira Sark, MD sent at 08/25/2015  3:47 PM EDT ----- Please let patient know that there were no obvious left-sided rib fractures.

## 2015-09-26 DIAGNOSIS — N138 Other obstructive and reflux uropathy: Secondary | ICD-10-CM | POA: Diagnosis not present

## 2015-09-26 DIAGNOSIS — M542 Cervicalgia: Secondary | ICD-10-CM | POA: Diagnosis not present

## 2015-09-26 DIAGNOSIS — M545 Low back pain: Secondary | ICD-10-CM | POA: Diagnosis not present

## 2015-09-26 DIAGNOSIS — I4891 Unspecified atrial fibrillation: Secondary | ICD-10-CM | POA: Diagnosis not present

## 2015-09-29 ENCOUNTER — Other Ambulatory Visit: Payer: Self-pay | Admitting: Pulmonary Disease

## 2015-09-29 DIAGNOSIS — M545 Low back pain: Principal | ICD-10-CM

## 2015-09-29 DIAGNOSIS — G8929 Other chronic pain: Secondary | ICD-10-CM

## 2015-10-04 ENCOUNTER — Ambulatory Visit: Payer: Medicare Other | Admitting: Urology

## 2015-10-10 ENCOUNTER — Encounter (HOSPITAL_COMMUNITY): Payer: Self-pay

## 2015-10-10 ENCOUNTER — Emergency Department (HOSPITAL_COMMUNITY)
Admission: EM | Admit: 2015-10-10 | Discharge: 2015-10-10 | Disposition: A | Discharge status: Home / Self Care | Payer: Medicare Other | Attending: Emergency Medicine | Admitting: Emergency Medicine

## 2015-10-10 ENCOUNTER — Emergency Department (HOSPITAL_COMMUNITY): Payer: Medicare Other

## 2015-10-10 DIAGNOSIS — Z79899 Other long term (current) drug therapy: Secondary | ICD-10-CM | POA: Insufficient documentation

## 2015-10-10 DIAGNOSIS — S32040A Wedge compression fracture of fourth lumbar vertebra, initial encounter for closed fracture: Secondary | ICD-10-CM | POA: Insufficient documentation

## 2015-10-10 DIAGNOSIS — K219 Gastro-esophageal reflux disease without esophagitis: Secondary | ICD-10-CM | POA: Diagnosis not present

## 2015-10-10 DIAGNOSIS — Z8546 Personal history of malignant neoplasm of prostate: Secondary | ICD-10-CM | POA: Insufficient documentation

## 2015-10-10 DIAGNOSIS — S3210XA Unspecified fracture of sacrum, initial encounter for closed fracture: Secondary | ICD-10-CM

## 2015-10-10 DIAGNOSIS — Z951 Presence of aortocoronary bypass graft: Secondary | ICD-10-CM | POA: Diagnosis not present

## 2015-10-10 DIAGNOSIS — I4891 Unspecified atrial fibrillation: Secondary | ICD-10-CM | POA: Diagnosis not present

## 2015-10-10 DIAGNOSIS — Z7901 Long term (current) use of anticoagulants: Secondary | ICD-10-CM | POA: Diagnosis not present

## 2015-10-10 DIAGNOSIS — Y9389 Activity, other specified: Secondary | ICD-10-CM | POA: Insufficient documentation

## 2015-10-10 DIAGNOSIS — Y9289 Other specified places as the place of occurrence of the external cause: Secondary | ICD-10-CM | POA: Diagnosis not present

## 2015-10-10 DIAGNOSIS — S32000A Wedge compression fracture of unspecified lumbar vertebra, initial encounter for closed fracture: Secondary | ICD-10-CM

## 2015-10-10 DIAGNOSIS — I2510 Atherosclerotic heart disease of native coronary artery without angina pectoris: Secondary | ICD-10-CM | POA: Diagnosis not present

## 2015-10-10 DIAGNOSIS — Y998 Other external cause status: Secondary | ICD-10-CM | POA: Diagnosis not present

## 2015-10-10 DIAGNOSIS — S32049A Unspecified fracture of fourth lumbar vertebra, initial encounter for closed fracture: Secondary | ICD-10-CM | POA: Diagnosis not present

## 2015-10-10 DIAGNOSIS — M549 Dorsalgia, unspecified: Secondary | ICD-10-CM

## 2015-10-10 DIAGNOSIS — X58XXXA Exposure to other specified factors, initial encounter: Secondary | ICD-10-CM | POA: Diagnosis not present

## 2015-10-10 DIAGNOSIS — R52 Pain, unspecified: Secondary | ICD-10-CM | POA: Diagnosis not present

## 2015-10-10 DIAGNOSIS — M545 Low back pain: Secondary | ICD-10-CM | POA: Diagnosis not present

## 2015-10-10 DIAGNOSIS — M25559 Pain in unspecified hip: Secondary | ICD-10-CM | POA: Diagnosis not present

## 2015-10-10 DIAGNOSIS — S79912A Unspecified injury of left hip, initial encounter: Secondary | ICD-10-CM | POA: Diagnosis present

## 2015-10-10 MED ORDER — MORPHINE SULFATE (PF) 4 MG/ML IV SOLN
4.0000 mg | INTRAVENOUS | Status: DC | PRN
  Filled 2015-10-10: qty 1

## 2015-10-10 MED ORDER — METHOCARBAMOL 1000 MG/10ML IJ SOLN
1000.0000 mg | Freq: Once | INTRAVENOUS | Status: AC
  Administered 2015-10-10: 1000 mg via INTRAVENOUS
  Filled 2015-10-10: qty 10

## 2015-10-10 MED ORDER — ALPRAZOLAM 0.5 MG PO TABS
0.2500 mg | ORAL_TABLET | Freq: Once | ORAL | Status: AC
  Administered 2015-10-10: 0.25 mg via ORAL
  Filled 2015-10-10: qty 1

## 2015-10-10 MED ORDER — ONDANSETRON HCL 4 MG/2ML IJ SOLN
4.0000 mg | Freq: Once | INTRAMUSCULAR | Status: AC
  Administered 2015-10-10: 4 mg via INTRAVENOUS
  Filled 2015-10-10: qty 2

## 2015-10-10 MED ORDER — OXYCODONE-ACETAMINOPHEN 5-325 MG PO TABS: 1.0000 | ORAL_TABLET | ORAL | Status: DC | PRN

## 2015-10-10 MED ORDER — OXYCODONE-ACETAMINOPHEN 5-325 MG PO TABS
1.0000 | ORAL_TABLET | Freq: Once | ORAL | Status: AC
  Administered 2015-10-10: 1 via ORAL
  Filled 2015-10-10: qty 1

## 2015-10-10 MED ORDER — DOCUSATE SODIUM 100 MG PO CAPS: ORAL_CAPSULE | ORAL | Status: DC

## 2015-10-10 NOTE — ED Notes (Signed)
Pt brought in by EMS reported he was seen a month ago when he fell and hurt his back. Approx 4 days ago he was lifting the back off his toilet and felt his right hip pain and another pop in left hip when he put lid back on. Increasing pain w ambulation

## 2015-10-10 NOTE — Discharge Instructions (Signed)
Lumbar Fracture °A lumbar fracture is a break in one of the bones of the lower back. Lumbar fractures range in severity. Severe fractures can damage the spinal cord. °CAUSES °This condition may be caused by: °· A fall (common). °· A car accident (common). °· A gunshot wound. °· A hard, direct hit to the back. °· Osteoporosis. °SYMPTOMS °The main symptom of this condition is severe pain in the lower back. If a fracture is complex or severe, there may also be: °· A misshapen or swollen area on the lower back. °· A limited ability to move an area of the lower back. °· An inability to empty the bladder or bowel. °· A loss of strength or sensation in the legs, feet, and toes. °· Paralysis. °DIAGNOSIS °This condition is diagnosed based on: °· A physical exam. °· Symptoms and what happened just before they developed. °· The results of imaging tests, such as an X-ray, CT scan, or MRI. °If your nerves have been damaged, you may also have other tests to find out how much damage there is. °TREATMENT °Treatment for this condition depends on the specifics of the injury. Most fractures can be treated with: °· A back brace. °· Bed rest and activity restrictions. °· Pain medicine. °· Physical therapy. °Fractures that are complex, involve multiple bones, or make the spine unstable may require surgery to remove pressure from the nerves or spinal cord and to stabilize the broken pieces of bone. °During recovery, it is normal to have pain and stiffness in the back for weeks. °HOME CARE INSTRUCTIONS °Medicines °· Take medicines only as directed by your health care provider. °· Do not drive or operate heavy machinery while taking pain medicine. ° Activity °· Stay in bed for as long as directed by your health care provider. °· If you were shown how to do any exercises to improve motion and strength in your back, do them as directed by your health care provider. °· Return to your normal activities as directed by your health care provider.  Ask your health care provider what activities are safe for you. °General Instructions °· If you were given a neck brace or back brace, wear it as directed by your health care provider. °· Keep all follow-up visits as directed by your health care provider. This is important. Failure to follow-up as recommended could result in permanent injury, disability, and long-lasting (chronic) pain. °SEEK MEDICAL CARE IF: °· Your pain does not improve over time. °· You have a persistent cough. °· You cannot return to your normal activities as planned or expected. °SEEK IMMEDIATE MEDICAL CARE IF: °· You have severe pain or your pain suddenly gets worse. °· You are unable to move. °· You have numbness, tingling, weakness, or paralysis in any part of your body. °· You cannot control your bladder or bowel. °· You have difficulty breathing. °· You have a fever. °· You have pain in your chest or abdomen. °· You vomit. °  °This information is not intended to replace advice given to you by your health care provider. Make sure you discuss any questions you have with your health care provider. °  °Document Released: 01/30/2007 Document Revised: 03/01/2015 Document Reviewed: 10/11/2014 °Elsevier Interactive Patient Education ©2016 [employer] ° °

## 2015-10-10 NOTE — ED Provider Notes (Signed)
CSN: [id number]     Arrival date & time 10/10/15  1159 History  By signing my name below, I, Tula Nakayama, attest that this documentation has been prepared under the direction and in the presence of Tanna Furry, MD.  Electronically Signed: Tula Nakayama, ED Scribe. 10/10/2015. 12:35 PM.   Chief Complaint  Patient presents with  . Hip Pain   The history is provided by the patient. No language interpreter was used.    HPI Comments: Sergio Boyle is a 81 y.o. male with a history of spinal stenosis and non-metastatic prostate cancer that was treated with external beam radiation in 2006 who presents to the Emergency Department complaining of constant, moderate lower back pain that radiates to his groin and scrotum and started 4 days ago after he picked the cover up from his toilet. He states an audible pop in his right, lateral lumbar area followed by a pop in his left lumbar area after replacing the cover. Pt takes Vicodin almost daily for chronic pain. He reports a previous injury to his right hip after he fell 1 month ago. Pt had negative x-rays of the hip on 10/27 following the fall. Pt ambulates with a walker. He denies weakness in his legs.  Past Medical History  Diagnosis Date  . Mixed hyperlipidemia   . Coronary atherosclerosis of native coronary artery     Prior stenting of LAD and diagonal at [facility] with subsequent SVG to diagonal, LVEF 55-60%  . Atrial fibrillation ([place])   . History of prostate cancer   . GERD   . Dysphagia   . Weight loss   . Hiccups    Past Surgical History  Procedure Laterality Date  . Coronary artery bypass graft  1996     Emergent SVG to diagonal at [facility]  . Bilateral inguinal hernia repair    . Esophagogastroduodenoscopy      w/esophageal dilation   . Colonoscopy  06/2010  . Upper gastrointestinal endoscopy  06/2010  . Esophagogastroduodenoscopy N/A 06/10/2015    Procedure: ESOPHAGOGASTRODUODENOSCOPY (EGD);  Surgeon: Rogene Houston, MD;  [facility];  Service: Endoscopy;  Laterality: N/A;  245  . Esophageal dilation N/A 06/10/2015    Procedure: ESOPHAGEAL DILATION;  Surgeon: Rogene Houston, MD;  Location[facility];  Service: Endoscopy;  Laterality: N/A;   Family History  Problem Relation Age of Onset  . Coronary artery disease Father    Social History  Substance Use Topics  . Smoking status: Never Smoker   . Smokeless tobacco: Never Used  . Alcohol Use: No    Review of Systems  Constitutional: Negative for fever, chills, diaphoresis, appetite change and fatigue.  HENT: Negative for mouth sores, sore throat and trouble swallowing.   Eyes: Negative for visual disturbance.  Respiratory: Negative for cough, chest tightness, shortness of breath and wheezing.   Cardiovascular: Negative for chest pain.  Gastrointestinal: Negative for nausea, vomiting, abdominal pain, diarrhea and abdominal distention.  Endocrine: Negative for polydipsia, polyphagia and polyuria.  Genitourinary: Negative for dysuria, frequency and hematuria.  Musculoskeletal: Positive for back pain. Negative for gait problem.  Skin: Negative for color change, pallor and rash.  Neurological: Negative for dizziness, syncope, weakness, light-headedness and headaches.  Hematological: Does not bruise/bleed easily.  Psychiatric/Behavioral: Negative for behavioral problems and confusion.   Allergies  Sulfonamide derivatives  Home Medications   Prior to Admission medications   Medication Sig Start Date End Date Taking? Authorizing Provider  ALPRAZolam Duanne Moron) 0.5 MG tablet  Take 1 tablet (0.5 mg total) by mouth 2 (two) times daily. 08/16/15  Yes Rogene Houston, MD  alum & mag hydroxide-simeth (MYLANTA) 200-200-20 MG/5ML suspension Take 10 mLs by mouth 2 (two) times daily as needed for indigestion or heartburn.    Yes Historical Provider, MD  atorvastatin (LIPITOR) 20 MG tablet Take 20 mg by mouth daily.     Yes Historical Provider, MD  cholecalciferol  (VITAMIN D) 1000 UNITS tablet Take 1,000 Units by mouth daily.   Yes Historical Provider, MD  digoxin (LANOXIN) 0.125 MG tablet TAKE ONE-HALF TABLET BY MOUTH ONCE A DAY 11/15/14  Yes Satira Sark, MD  HYDROcodone-acetaminophen (NORCO/VICODIN) 5-325 MG per tablet Take 1 tablet by mouth as needed for moderate pain.   Yes Historical Provider, MD  magnesium oxide (MAG-OX 400) 400 MG tablet Take 1 tablet (400 mg total) by mouth every 7 (seven) days. 01/13/13  Yes Rogene Houston, MD  pantoprazole (PROTONIX) 40 MG tablet Take 1 tablet (40 mg total) by mouth daily before breakfast. 02/08/15  Yes Rogene Houston, MD  warfarin (COUMADIN) 3 MG tablet Take 3 mg by mouth daily.  03/09/11  Yes Historical Provider, MD  docusate sodium (COLACE) 100 MG capsule As needed if you go more than 24 hours without bowel movement while taking pain medication 10/10/15   Tanna Furry, MD  oxyCODONE-acetaminophen (PERCOCET/ROXICET) 5-325 MG tablet Take 1 tablet by mouth every 4 (four) hours as needed. 10/10/15   Tanna Furry, MD   BP 155/92 mmHg  Pulse 88  Temp(Src) 97.6 F (36.4 C) (Oral)  Resp 17  Ht 5\' 9"  (1.753 m)  Wt 160 lb (72.576 kg)  BMI 23.62 kg/m2  SpO2 99% Physical Exam  Constitutional: He is oriented to person, place, and time. He appears well-developed and well-nourished. No distress.  HENT:  Head: Normocephalic.  Eyes: Conjunctivae are normal. Pupils are equal, round, and reactive to light. No scleral icterus.  Neck: Normal range of motion. Neck supple. No thyromegaly present.  Cardiovascular: Normal rate and regular rhythm.  Exam reveals no gallop and no friction rub.   No murmur heard. Pulmonary/Chest: Effort normal and breath sounds normal. No respiratory distress. He has no wheezes. He has no rales.  Abdominal: Soft. Bowel sounds are normal. He exhibits no distension. There is no tenderness. There is no rebound.  Musculoskeletal: Normal range of motion.  No thoracic pain Some pain in the middle  lower lumbar region Pain right paraspinal lumbar region  Neurological: He is alert and oriented to person, place, and time. No cranial nerve deficit. He exhibits normal muscle tone. Coordination normal.  Normal strength in bilateral LE  Skin: Skin is warm and dry. No rash noted.  Psychiatric: He has a normal mood and affect. His behavior is normal.  Nursing note and vitals reviewed.   ED Course  Procedures  DIAGNOSTIC STUDIES: Oxygen Saturation is 100% on RA, normal by my interpretation.    COORDINATION OF CARE: 12:35 PM Discussed treatment plan with pt which includes an MRI of his back. He agreed to plan.  Labs Review Labs Reviewed - No data to display  Imaging Review Mr Lumbar Spine Wo Contrast  10/10/2015  CLINICAL DATA:  Low back pain with weakness and difficulty walking, 2 weeks duration. History of prostate cancer. EXAM: MRI LUMBAR SPINE WITHOUT CONTRAST TECHNIQUE: Multiplanar, multisequence MR imaging of the lumbar spine was performed. No intravenous contrast was administered. COMPARISON:  12/23/2006 FINDINGS: As previous, there is transitional anatomy with S1  being a transitional vertebra. There is an acute or subacute superior endplate fracture at L4 with loss of height of 10%. No retropulsed bone. This could be a cause of acute pain. L1-2: Bulging of the disc. No compressive stenosis. Conus tip at lower L2. L2-3: Multifactorial spinal stenosis because of endplate osteophytes, broad-based herniation of disc material and facet and ligamentous hypertrophy. There is some potential for neural compression at this level. L3-4: Multifactorial spinal stenosis because of circumferential protrusion of disc material in combination with facet and ligamentous hypertrophy. There is potential for neural compression at this level. L4-5: Multifactorial spinal stenosis because of circumferential protrusion of disc material in combination with facet and ligamentous hypertrophy. There is potential for  neural compression at this level. L5-S1: Endplate osteophytes and bulging of the disc. Mild facet and ligamentous hypertrophy. Stenosis of the subarticular lateral recesses and neural foramina. Lateral recess stenosis could be symptomatic. S1-2: Transitional and unremarkable. However, there is abnormal marrow edema within the sacrum consistent with a sacral insufficiency fracture. This is more extensive on the left than the right. IMPRESSION: Acute superior endplate fracture at L4 with loss of height of 10%. No retropulsed bone. This could be a cause of acute back pain. Acute sacral insufficiency fracture, more extensive on the left. This could be a cause of pain. Multifactorial spinal stenosis at L2-3, L3-4 and L4-5 that could be symptomatic at any or all of those levels. Stenosis of the lateral recesses at L5-S1 that could be symptomatic. Electronically Signed   By: Nelson Chimes M.D.   On: 10/10/2015 13:52   I have personally reviewed and evaluated these images and lab results as part of my medical decision-making.   EKG Interpretation None      MDM   Final diagnoses:  Back pain  Lumbar compression fracture, closed, initial encounter ([place])  Closed fracture of sacrum, unspecified fracture morphology, initial encounter [facility])    Patient with marked multilevel degenerative disc disease, as well as spinal stenosis. However, MRI suggesting new superior endplate L4 compression fracture of 10%, and sacral insufficiency fracture with surrounding edema/inflammation. Discussed at length with the family and patient. Plan is symptomatic treatment, and pain control. He is taking hydrocodone which offers minimal relief. We will give Percocet. Colace as needed. Primary care follow-up.  I personally performed the services described in this documentation, which was scribed in my presence. The recorded information has been reviewed and is accurate.    Tanna Furry, MD 10/10/15 [phone]

## 2015-10-10 NOTE — ED Notes (Signed)
Also reports constipation since last Thursday. He has taken colace and enemas. Taking vicodin for pain

## 2015-10-13 ENCOUNTER — Other Ambulatory Visit: Payer: Medicare Other

## 2015-10-17 DIAGNOSIS — R32 Unspecified urinary incontinence: Secondary | ICD-10-CM | POA: Diagnosis not present

## 2015-10-17 DIAGNOSIS — I2510 Atherosclerotic heart disease of native coronary artery without angina pectoris: Secondary | ICD-10-CM | POA: Diagnosis not present

## 2015-10-17 DIAGNOSIS — Z8546 Personal history of malignant neoplasm of prostate: Secondary | ICD-10-CM | POA: Diagnosis not present

## 2015-10-17 DIAGNOSIS — Z7901 Long term (current) use of anticoagulants: Secondary | ICD-10-CM | POA: Diagnosis not present

## 2015-10-17 DIAGNOSIS — R1310 Dysphagia, unspecified: Secondary | ICD-10-CM | POA: Diagnosis not present

## 2015-10-17 DIAGNOSIS — I4891 Unspecified atrial fibrillation: Secondary | ICD-10-CM | POA: Diagnosis not present

## 2015-10-17 DIAGNOSIS — Z5181 Encounter for therapeutic drug level monitoring: Secondary | ICD-10-CM | POA: Diagnosis not present

## 2015-10-17 DIAGNOSIS — R627 Adult failure to thrive: Secondary | ICD-10-CM | POA: Diagnosis not present

## 2015-10-17 DIAGNOSIS — S322XXD Fracture of coccyx, subsequent encounter for fracture with routine healing: Secondary | ICD-10-CM | POA: Diagnosis not present

## 2015-10-18 DIAGNOSIS — I2510 Atherosclerotic heart disease of native coronary artery without angina pectoris: Secondary | ICD-10-CM | POA: Diagnosis not present

## 2015-10-18 DIAGNOSIS — I4891 Unspecified atrial fibrillation: Secondary | ICD-10-CM | POA: Diagnosis not present

## 2015-10-18 DIAGNOSIS — S322XXD Fracture of coccyx, subsequent encounter for fracture with routine healing: Secondary | ICD-10-CM | POA: Diagnosis not present

## 2015-10-18 DIAGNOSIS — Z5181 Encounter for therapeutic drug level monitoring: Secondary | ICD-10-CM | POA: Diagnosis not present

## 2015-10-18 DIAGNOSIS — R32 Unspecified urinary incontinence: Secondary | ICD-10-CM | POA: Diagnosis not present

## 2015-10-18 DIAGNOSIS — Z8546 Personal history of malignant neoplasm of prostate: Secondary | ICD-10-CM | POA: Diagnosis not present

## 2015-10-18 DIAGNOSIS — R627 Adult failure to thrive: Secondary | ICD-10-CM | POA: Diagnosis not present

## 2015-10-18 DIAGNOSIS — Z7901 Long term (current) use of anticoagulants: Secondary | ICD-10-CM | POA: Diagnosis not present

## 2015-10-18 DIAGNOSIS — R1310 Dysphagia, unspecified: Secondary | ICD-10-CM | POA: Diagnosis not present

## 2015-10-19 DIAGNOSIS — R0600 Dyspnea, unspecified: Secondary | ICD-10-CM | POA: Diagnosis not present

## 2015-10-19 DIAGNOSIS — R269 Unspecified abnormalities of gait and mobility: Secondary | ICD-10-CM | POA: Diagnosis not present

## 2015-10-19 DIAGNOSIS — S322XXD Fracture of coccyx, subsequent encounter for fracture with routine healing: Secondary | ICD-10-CM | POA: Diagnosis not present

## 2015-10-19 DIAGNOSIS — R32 Unspecified urinary incontinence: Secondary | ICD-10-CM | POA: Diagnosis not present

## 2015-10-19 DIAGNOSIS — Z5181 Encounter for therapeutic drug level monitoring: Secondary | ICD-10-CM | POA: Diagnosis not present

## 2015-10-19 DIAGNOSIS — R627 Adult failure to thrive: Secondary | ICD-10-CM | POA: Diagnosis not present

## 2015-10-19 DIAGNOSIS — Z7901 Long term (current) use of anticoagulants: Secondary | ICD-10-CM | POA: Diagnosis not present

## 2015-10-19 DIAGNOSIS — I4891 Unspecified atrial fibrillation: Secondary | ICD-10-CM | POA: Diagnosis not present

## 2015-10-19 DIAGNOSIS — R1310 Dysphagia, unspecified: Secondary | ICD-10-CM | POA: Diagnosis not present

## 2015-10-19 DIAGNOSIS — Z8546 Personal history of malignant neoplasm of prostate: Secondary | ICD-10-CM | POA: Diagnosis not present

## 2015-10-19 DIAGNOSIS — I2510 Atherosclerotic heart disease of native coronary artery without angina pectoris: Secondary | ICD-10-CM | POA: Diagnosis not present

## 2015-10-20 DIAGNOSIS — R1310 Dysphagia, unspecified: Secondary | ICD-10-CM | POA: Diagnosis not present

## 2015-10-20 DIAGNOSIS — R627 Adult failure to thrive: Secondary | ICD-10-CM | POA: Diagnosis not present

## 2015-10-20 DIAGNOSIS — I4891 Unspecified atrial fibrillation: Secondary | ICD-10-CM | POA: Diagnosis not present

## 2015-10-20 DIAGNOSIS — Z5181 Encounter for therapeutic drug level monitoring: Secondary | ICD-10-CM | POA: Diagnosis not present

## 2015-10-20 DIAGNOSIS — Z8546 Personal history of malignant neoplasm of prostate: Secondary | ICD-10-CM | POA: Diagnosis not present

## 2015-10-20 DIAGNOSIS — S322XXD Fracture of coccyx, subsequent encounter for fracture with routine healing: Secondary | ICD-10-CM | POA: Diagnosis not present

## 2015-10-20 DIAGNOSIS — I2510 Atherosclerotic heart disease of native coronary artery without angina pectoris: Secondary | ICD-10-CM | POA: Diagnosis not present

## 2015-10-20 DIAGNOSIS — Z7901 Long term (current) use of anticoagulants: Secondary | ICD-10-CM | POA: Diagnosis not present

## 2015-10-20 DIAGNOSIS — R32 Unspecified urinary incontinence: Secondary | ICD-10-CM | POA: Diagnosis not present

## 2015-10-24 DIAGNOSIS — Z7901 Long term (current) use of anticoagulants: Secondary | ICD-10-CM | POA: Diagnosis not present

## 2015-10-24 DIAGNOSIS — Z5181 Encounter for therapeutic drug level monitoring: Secondary | ICD-10-CM | POA: Diagnosis not present

## 2015-10-24 DIAGNOSIS — S322XXD Fracture of coccyx, subsequent encounter for fracture with routine healing: Secondary | ICD-10-CM | POA: Diagnosis not present

## 2015-10-24 DIAGNOSIS — I4891 Unspecified atrial fibrillation: Secondary | ICD-10-CM | POA: Diagnosis not present

## 2015-10-24 DIAGNOSIS — I2510 Atherosclerotic heart disease of native coronary artery without angina pectoris: Secondary | ICD-10-CM | POA: Diagnosis not present

## 2015-10-24 DIAGNOSIS — R1310 Dysphagia, unspecified: Secondary | ICD-10-CM | POA: Diagnosis not present

## 2015-10-24 DIAGNOSIS — Z8546 Personal history of malignant neoplasm of prostate: Secondary | ICD-10-CM | POA: Diagnosis not present

## 2015-10-24 DIAGNOSIS — R32 Unspecified urinary incontinence: Secondary | ICD-10-CM | POA: Diagnosis not present

## 2015-10-24 DIAGNOSIS — R627 Adult failure to thrive: Secondary | ICD-10-CM | POA: Diagnosis not present

## 2015-10-25 DIAGNOSIS — R627 Adult failure to thrive: Secondary | ICD-10-CM | POA: Diagnosis not present

## 2015-10-25 DIAGNOSIS — R1310 Dysphagia, unspecified: Secondary | ICD-10-CM | POA: Diagnosis not present

## 2015-10-25 DIAGNOSIS — R32 Unspecified urinary incontinence: Secondary | ICD-10-CM | POA: Diagnosis not present

## 2015-10-25 DIAGNOSIS — I4891 Unspecified atrial fibrillation: Secondary | ICD-10-CM | POA: Diagnosis not present

## 2015-10-25 DIAGNOSIS — I2510 Atherosclerotic heart disease of native coronary artery without angina pectoris: Secondary | ICD-10-CM | POA: Diagnosis not present

## 2015-10-25 DIAGNOSIS — Z5181 Encounter for therapeutic drug level monitoring: Secondary | ICD-10-CM | POA: Diagnosis not present

## 2015-10-25 DIAGNOSIS — Z8546 Personal history of malignant neoplasm of prostate: Secondary | ICD-10-CM | POA: Diagnosis not present

## 2015-10-25 DIAGNOSIS — Z7901 Long term (current) use of anticoagulants: Secondary | ICD-10-CM | POA: Diagnosis not present

## 2015-10-25 DIAGNOSIS — S322XXD Fracture of coccyx, subsequent encounter for fracture with routine healing: Secondary | ICD-10-CM | POA: Diagnosis not present

## 2015-10-26 DIAGNOSIS — S322XXD Fracture of coccyx, subsequent encounter for fracture with routine healing: Secondary | ICD-10-CM | POA: Diagnosis not present

## 2015-10-26 DIAGNOSIS — R1310 Dysphagia, unspecified: Secondary | ICD-10-CM | POA: Diagnosis not present

## 2015-10-26 DIAGNOSIS — I4891 Unspecified atrial fibrillation: Secondary | ICD-10-CM | POA: Diagnosis not present

## 2015-10-26 DIAGNOSIS — Z8546 Personal history of malignant neoplasm of prostate: Secondary | ICD-10-CM | POA: Diagnosis not present

## 2015-10-26 DIAGNOSIS — Z7901 Long term (current) use of anticoagulants: Secondary | ICD-10-CM | POA: Diagnosis not present

## 2015-10-26 DIAGNOSIS — R627 Adult failure to thrive: Secondary | ICD-10-CM | POA: Diagnosis not present

## 2015-10-26 DIAGNOSIS — Z5181 Encounter for therapeutic drug level monitoring: Secondary | ICD-10-CM | POA: Diagnosis not present

## 2015-10-26 DIAGNOSIS — R32 Unspecified urinary incontinence: Secondary | ICD-10-CM | POA: Diagnosis not present

## 2015-10-26 DIAGNOSIS — I2510 Atherosclerotic heart disease of native coronary artery without angina pectoris: Secondary | ICD-10-CM | POA: Diagnosis not present

## 2015-10-27 DIAGNOSIS — Z5181 Encounter for therapeutic drug level monitoring: Secondary | ICD-10-CM | POA: Diagnosis not present

## 2015-10-27 DIAGNOSIS — S322XXD Fracture of coccyx, subsequent encounter for fracture with routine healing: Secondary | ICD-10-CM | POA: Diagnosis not present

## 2015-10-27 DIAGNOSIS — I2510 Atherosclerotic heart disease of native coronary artery without angina pectoris: Secondary | ICD-10-CM | POA: Diagnosis not present

## 2015-10-27 DIAGNOSIS — Z8546 Personal history of malignant neoplasm of prostate: Secondary | ICD-10-CM | POA: Diagnosis not present

## 2015-10-27 DIAGNOSIS — R627 Adult failure to thrive: Secondary | ICD-10-CM | POA: Diagnosis not present

## 2015-10-27 DIAGNOSIS — Z7901 Long term (current) use of anticoagulants: Secondary | ICD-10-CM | POA: Diagnosis not present

## 2015-10-27 DIAGNOSIS — R1310 Dysphagia, unspecified: Secondary | ICD-10-CM | POA: Diagnosis not present

## 2015-10-27 DIAGNOSIS — I4891 Unspecified atrial fibrillation: Secondary | ICD-10-CM | POA: Diagnosis not present

## 2015-10-27 DIAGNOSIS — R32 Unspecified urinary incontinence: Secondary | ICD-10-CM | POA: Diagnosis not present

## 2015-10-28 DIAGNOSIS — I2510 Atherosclerotic heart disease of native coronary artery without angina pectoris: Secondary | ICD-10-CM | POA: Diagnosis not present

## 2015-10-28 DIAGNOSIS — Z8546 Personal history of malignant neoplasm of prostate: Secondary | ICD-10-CM | POA: Diagnosis not present

## 2015-10-28 DIAGNOSIS — I4891 Unspecified atrial fibrillation: Secondary | ICD-10-CM | POA: Diagnosis not present

## 2015-10-28 DIAGNOSIS — R32 Unspecified urinary incontinence: Secondary | ICD-10-CM | POA: Diagnosis not present

## 2015-10-28 DIAGNOSIS — S322XXD Fracture of coccyx, subsequent encounter for fracture with routine healing: Secondary | ICD-10-CM | POA: Diagnosis not present

## 2015-10-28 DIAGNOSIS — R627 Adult failure to thrive: Secondary | ICD-10-CM | POA: Diagnosis not present

## 2015-10-28 DIAGNOSIS — Z7901 Long term (current) use of anticoagulants: Secondary | ICD-10-CM | POA: Diagnosis not present

## 2015-10-28 DIAGNOSIS — Z5181 Encounter for therapeutic drug level monitoring: Secondary | ICD-10-CM | POA: Diagnosis not present

## 2015-10-28 DIAGNOSIS — R1310 Dysphagia, unspecified: Secondary | ICD-10-CM | POA: Diagnosis not present

## 2015-10-31 DIAGNOSIS — S322XXD Fracture of coccyx, subsequent encounter for fracture with routine healing: Secondary | ICD-10-CM | POA: Diagnosis not present

## 2015-10-31 DIAGNOSIS — Z7901 Long term (current) use of anticoagulants: Secondary | ICD-10-CM | POA: Diagnosis not present

## 2015-10-31 DIAGNOSIS — I4891 Unspecified atrial fibrillation: Secondary | ICD-10-CM | POA: Diagnosis not present

## 2015-10-31 DIAGNOSIS — I2510 Atherosclerotic heart disease of native coronary artery without angina pectoris: Secondary | ICD-10-CM | POA: Diagnosis not present

## 2015-10-31 DIAGNOSIS — Z5181 Encounter for therapeutic drug level monitoring: Secondary | ICD-10-CM | POA: Diagnosis not present

## 2015-10-31 DIAGNOSIS — R627 Adult failure to thrive: Secondary | ICD-10-CM | POA: Diagnosis not present

## 2015-10-31 DIAGNOSIS — R1310 Dysphagia, unspecified: Secondary | ICD-10-CM | POA: Diagnosis not present

## 2015-11-01 DIAGNOSIS — S322XXD Fracture of coccyx, subsequent encounter for fracture with routine healing: Secondary | ICD-10-CM | POA: Diagnosis not present

## 2015-11-01 DIAGNOSIS — Z7901 Long term (current) use of anticoagulants: Secondary | ICD-10-CM | POA: Diagnosis not present

## 2015-11-01 DIAGNOSIS — I2510 Atherosclerotic heart disease of native coronary artery without angina pectoris: Secondary | ICD-10-CM | POA: Diagnosis not present

## 2015-11-01 DIAGNOSIS — Z5181 Encounter for therapeutic drug level monitoring: Secondary | ICD-10-CM | POA: Diagnosis not present

## 2015-11-01 DIAGNOSIS — I4891 Unspecified atrial fibrillation: Secondary | ICD-10-CM | POA: Diagnosis not present

## 2015-11-01 DIAGNOSIS — R627 Adult failure to thrive: Secondary | ICD-10-CM | POA: Diagnosis not present

## 2015-11-01 DIAGNOSIS — R1310 Dysphagia, unspecified: Secondary | ICD-10-CM | POA: Diagnosis not present

## 2015-11-03 DIAGNOSIS — R627 Adult failure to thrive: Secondary | ICD-10-CM | POA: Diagnosis not present

## 2015-11-03 DIAGNOSIS — S322XXD Fracture of coccyx, subsequent encounter for fracture with routine healing: Secondary | ICD-10-CM | POA: Diagnosis not present

## 2015-11-03 DIAGNOSIS — I4891 Unspecified atrial fibrillation: Secondary | ICD-10-CM | POA: Diagnosis not present

## 2015-11-03 DIAGNOSIS — R1310 Dysphagia, unspecified: Secondary | ICD-10-CM | POA: Diagnosis not present

## 2015-11-03 DIAGNOSIS — Z5181 Encounter for therapeutic drug level monitoring: Secondary | ICD-10-CM | POA: Diagnosis not present

## 2015-11-03 DIAGNOSIS — I2510 Atherosclerotic heart disease of native coronary artery without angina pectoris: Secondary | ICD-10-CM | POA: Diagnosis not present

## 2015-11-03 DIAGNOSIS — Z7901 Long term (current) use of anticoagulants: Secondary | ICD-10-CM | POA: Diagnosis not present

## 2015-11-07 DIAGNOSIS — S322XXD Fracture of coccyx, subsequent encounter for fracture with routine healing: Secondary | ICD-10-CM | POA: Diagnosis not present

## 2015-11-07 DIAGNOSIS — R627 Adult failure to thrive: Secondary | ICD-10-CM | POA: Diagnosis not present

## 2015-11-07 DIAGNOSIS — I4891 Unspecified atrial fibrillation: Secondary | ICD-10-CM | POA: Diagnosis not present

## 2015-11-07 DIAGNOSIS — I2510 Atherosclerotic heart disease of native coronary artery without angina pectoris: Secondary | ICD-10-CM | POA: Diagnosis not present

## 2015-11-08 DIAGNOSIS — R627 Adult failure to thrive: Secondary | ICD-10-CM | POA: Diagnosis not present

## 2015-11-08 DIAGNOSIS — S322XXD Fracture of coccyx, subsequent encounter for fracture with routine healing: Secondary | ICD-10-CM | POA: Diagnosis not present

## 2015-11-08 DIAGNOSIS — Z5181 Encounter for therapeutic drug level monitoring: Secondary | ICD-10-CM | POA: Diagnosis not present

## 2015-11-08 DIAGNOSIS — R1310 Dysphagia, unspecified: Secondary | ICD-10-CM | POA: Diagnosis not present

## 2015-11-08 DIAGNOSIS — I2510 Atherosclerotic heart disease of native coronary artery without angina pectoris: Secondary | ICD-10-CM | POA: Diagnosis not present

## 2015-11-08 DIAGNOSIS — I4891 Unspecified atrial fibrillation: Secondary | ICD-10-CM | POA: Diagnosis not present

## 2015-11-08 DIAGNOSIS — Z7901 Long term (current) use of anticoagulants: Secondary | ICD-10-CM | POA: Diagnosis not present

## 2015-11-09 DIAGNOSIS — Z5181 Encounter for therapeutic drug level monitoring: Secondary | ICD-10-CM | POA: Diagnosis not present

## 2015-11-09 DIAGNOSIS — Z7901 Long term (current) use of anticoagulants: Secondary | ICD-10-CM | POA: Diagnosis not present

## 2015-11-09 DIAGNOSIS — I2510 Atherosclerotic heart disease of native coronary artery without angina pectoris: Secondary | ICD-10-CM | POA: Diagnosis not present

## 2015-11-09 DIAGNOSIS — R1310 Dysphagia, unspecified: Secondary | ICD-10-CM | POA: Diagnosis not present

## 2015-11-09 DIAGNOSIS — I4891 Unspecified atrial fibrillation: Secondary | ICD-10-CM | POA: Diagnosis not present

## 2015-11-09 DIAGNOSIS — S322XXD Fracture of coccyx, subsequent encounter for fracture with routine healing: Secondary | ICD-10-CM | POA: Diagnosis not present

## 2015-11-09 DIAGNOSIS — R627 Adult failure to thrive: Secondary | ICD-10-CM | POA: Diagnosis not present

## 2015-11-10 DIAGNOSIS — I2510 Atherosclerotic heart disease of native coronary artery without angina pectoris: Secondary | ICD-10-CM | POA: Diagnosis not present

## 2015-11-10 DIAGNOSIS — Z5181 Encounter for therapeutic drug level monitoring: Secondary | ICD-10-CM | POA: Diagnosis not present

## 2015-11-10 DIAGNOSIS — I4891 Unspecified atrial fibrillation: Secondary | ICD-10-CM | POA: Diagnosis not present

## 2015-11-10 DIAGNOSIS — R627 Adult failure to thrive: Secondary | ICD-10-CM | POA: Diagnosis not present

## 2015-11-10 DIAGNOSIS — S322XXD Fracture of coccyx, subsequent encounter for fracture with routine healing: Secondary | ICD-10-CM | POA: Diagnosis not present

## 2015-11-10 DIAGNOSIS — Z7901 Long term (current) use of anticoagulants: Secondary | ICD-10-CM | POA: Diagnosis not present

## 2015-11-10 DIAGNOSIS — R1310 Dysphagia, unspecified: Secondary | ICD-10-CM | POA: Diagnosis not present

## 2015-11-14 DIAGNOSIS — I4891 Unspecified atrial fibrillation: Secondary | ICD-10-CM | POA: Diagnosis not present

## 2015-11-14 DIAGNOSIS — Z5181 Encounter for therapeutic drug level monitoring: Secondary | ICD-10-CM | POA: Diagnosis not present

## 2015-11-14 DIAGNOSIS — S322XXD Fracture of coccyx, subsequent encounter for fracture with routine healing: Secondary | ICD-10-CM | POA: Diagnosis not present

## 2015-11-14 DIAGNOSIS — R627 Adult failure to thrive: Secondary | ICD-10-CM | POA: Diagnosis not present

## 2015-11-14 DIAGNOSIS — Z7901 Long term (current) use of anticoagulants: Secondary | ICD-10-CM | POA: Diagnosis not present

## 2015-11-14 DIAGNOSIS — R1310 Dysphagia, unspecified: Secondary | ICD-10-CM | POA: Diagnosis not present

## 2015-11-14 DIAGNOSIS — I2510 Atherosclerotic heart disease of native coronary artery without angina pectoris: Secondary | ICD-10-CM | POA: Diagnosis not present

## 2015-11-17 DIAGNOSIS — I2510 Atherosclerotic heart disease of native coronary artery without angina pectoris: Secondary | ICD-10-CM | POA: Diagnosis not present

## 2015-11-17 DIAGNOSIS — I4891 Unspecified atrial fibrillation: Secondary | ICD-10-CM | POA: Diagnosis not present

## 2015-11-17 DIAGNOSIS — Z7901 Long term (current) use of anticoagulants: Secondary | ICD-10-CM | POA: Diagnosis not present

## 2015-11-17 DIAGNOSIS — R627 Adult failure to thrive: Secondary | ICD-10-CM | POA: Diagnosis not present

## 2015-11-17 DIAGNOSIS — S322XXD Fracture of coccyx, subsequent encounter for fracture with routine healing: Secondary | ICD-10-CM | POA: Diagnosis not present

## 2015-11-17 DIAGNOSIS — Z5181 Encounter for therapeutic drug level monitoring: Secondary | ICD-10-CM | POA: Diagnosis not present

## 2015-11-17 DIAGNOSIS — R1310 Dysphagia, unspecified: Secondary | ICD-10-CM | POA: Diagnosis not present

## 2015-11-18 ENCOUNTER — Other Ambulatory Visit (INDEPENDENT_AMBULATORY_CARE_PROVIDER_SITE_OTHER): Payer: Self-pay | Admitting: Internal Medicine

## 2015-11-18 DIAGNOSIS — Z5181 Encounter for therapeutic drug level monitoring: Secondary | ICD-10-CM | POA: Diagnosis not present

## 2015-11-18 DIAGNOSIS — R1310 Dysphagia, unspecified: Secondary | ICD-10-CM | POA: Diagnosis not present

## 2015-11-18 DIAGNOSIS — S322XXD Fracture of coccyx, subsequent encounter for fracture with routine healing: Secondary | ICD-10-CM | POA: Diagnosis not present

## 2015-11-18 DIAGNOSIS — Z7901 Long term (current) use of anticoagulants: Secondary | ICD-10-CM | POA: Diagnosis not present

## 2015-11-18 DIAGNOSIS — I2510 Atherosclerotic heart disease of native coronary artery without angina pectoris: Secondary | ICD-10-CM | POA: Diagnosis not present

## 2015-11-18 DIAGNOSIS — R627 Adult failure to thrive: Secondary | ICD-10-CM | POA: Diagnosis not present

## 2015-11-18 DIAGNOSIS — I4891 Unspecified atrial fibrillation: Secondary | ICD-10-CM | POA: Diagnosis not present

## 2015-11-21 DIAGNOSIS — R627 Adult failure to thrive: Secondary | ICD-10-CM | POA: Diagnosis not present

## 2015-11-21 DIAGNOSIS — R1310 Dysphagia, unspecified: Secondary | ICD-10-CM | POA: Diagnosis not present

## 2015-11-21 DIAGNOSIS — I4891 Unspecified atrial fibrillation: Secondary | ICD-10-CM | POA: Diagnosis not present

## 2015-11-21 DIAGNOSIS — I2510 Atherosclerotic heart disease of native coronary artery without angina pectoris: Secondary | ICD-10-CM | POA: Diagnosis not present

## 2015-11-21 DIAGNOSIS — S322XXD Fracture of coccyx, subsequent encounter for fracture with routine healing: Secondary | ICD-10-CM | POA: Diagnosis not present

## 2015-11-21 DIAGNOSIS — Z5181 Encounter for therapeutic drug level monitoring: Secondary | ICD-10-CM | POA: Diagnosis not present

## 2015-11-21 DIAGNOSIS — Z7901 Long term (current) use of anticoagulants: Secondary | ICD-10-CM | POA: Diagnosis not present

## 2015-11-22 DIAGNOSIS — I2510 Atherosclerotic heart disease of native coronary artery without angina pectoris: Secondary | ICD-10-CM | POA: Diagnosis not present

## 2015-11-22 DIAGNOSIS — Z7901 Long term (current) use of anticoagulants: Secondary | ICD-10-CM | POA: Diagnosis not present

## 2015-11-22 DIAGNOSIS — R1310 Dysphagia, unspecified: Secondary | ICD-10-CM | POA: Diagnosis not present

## 2015-11-22 DIAGNOSIS — Z5181 Encounter for therapeutic drug level monitoring: Secondary | ICD-10-CM | POA: Diagnosis not present

## 2015-11-22 DIAGNOSIS — I4891 Unspecified atrial fibrillation: Secondary | ICD-10-CM | POA: Diagnosis not present

## 2015-11-22 DIAGNOSIS — S322XXD Fracture of coccyx, subsequent encounter for fracture with routine healing: Secondary | ICD-10-CM | POA: Diagnosis not present

## 2015-11-22 DIAGNOSIS — R627 Adult failure to thrive: Secondary | ICD-10-CM | POA: Diagnosis not present

## 2015-11-23 DIAGNOSIS — Z7901 Long term (current) use of anticoagulants: Secondary | ICD-10-CM | POA: Diagnosis not present

## 2015-11-23 DIAGNOSIS — I2510 Atherosclerotic heart disease of native coronary artery without angina pectoris: Secondary | ICD-10-CM | POA: Diagnosis not present

## 2015-11-23 DIAGNOSIS — Z5181 Encounter for therapeutic drug level monitoring: Secondary | ICD-10-CM | POA: Diagnosis not present

## 2015-11-23 DIAGNOSIS — S322XXD Fracture of coccyx, subsequent encounter for fracture with routine healing: Secondary | ICD-10-CM | POA: Diagnosis not present

## 2015-11-23 DIAGNOSIS — I4891 Unspecified atrial fibrillation: Secondary | ICD-10-CM | POA: Diagnosis not present

## 2015-11-23 DIAGNOSIS — R1310 Dysphagia, unspecified: Secondary | ICD-10-CM | POA: Diagnosis not present

## 2015-11-23 DIAGNOSIS — R627 Adult failure to thrive: Secondary | ICD-10-CM | POA: Diagnosis not present

## 2015-11-24 DIAGNOSIS — R627 Adult failure to thrive: Secondary | ICD-10-CM | POA: Diagnosis not present

## 2015-11-24 DIAGNOSIS — R1310 Dysphagia, unspecified: Secondary | ICD-10-CM | POA: Diagnosis not present

## 2015-11-24 DIAGNOSIS — Z5181 Encounter for therapeutic drug level monitoring: Secondary | ICD-10-CM | POA: Diagnosis not present

## 2015-11-24 DIAGNOSIS — S322XXD Fracture of coccyx, subsequent encounter for fracture with routine healing: Secondary | ICD-10-CM | POA: Diagnosis not present

## 2015-11-24 DIAGNOSIS — Z7901 Long term (current) use of anticoagulants: Secondary | ICD-10-CM | POA: Diagnosis not present

## 2015-11-24 DIAGNOSIS — I4891 Unspecified atrial fibrillation: Secondary | ICD-10-CM | POA: Diagnosis not present

## 2015-11-24 DIAGNOSIS — I2510 Atherosclerotic heart disease of native coronary artery without angina pectoris: Secondary | ICD-10-CM | POA: Diagnosis not present

## 2015-11-25 DIAGNOSIS — I4891 Unspecified atrial fibrillation: Secondary | ICD-10-CM | POA: Diagnosis not present

## 2015-11-25 DIAGNOSIS — I2510 Atherosclerotic heart disease of native coronary artery without angina pectoris: Secondary | ICD-10-CM | POA: Diagnosis not present

## 2015-11-25 DIAGNOSIS — R1310 Dysphagia, unspecified: Secondary | ICD-10-CM | POA: Diagnosis not present

## 2015-11-25 DIAGNOSIS — R627 Adult failure to thrive: Secondary | ICD-10-CM | POA: Diagnosis not present

## 2015-11-25 DIAGNOSIS — S322XXD Fracture of coccyx, subsequent encounter for fracture with routine healing: Secondary | ICD-10-CM | POA: Diagnosis not present

## 2015-11-25 DIAGNOSIS — Z7901 Long term (current) use of anticoagulants: Secondary | ICD-10-CM | POA: Diagnosis not present

## 2015-11-25 DIAGNOSIS — Z5181 Encounter for therapeutic drug level monitoring: Secondary | ICD-10-CM | POA: Diagnosis not present

## 2015-11-28 DIAGNOSIS — Z7901 Long term (current) use of anticoagulants: Secondary | ICD-10-CM | POA: Diagnosis not present

## 2015-11-28 DIAGNOSIS — I2510 Atherosclerotic heart disease of native coronary artery without angina pectoris: Secondary | ICD-10-CM | POA: Diagnosis not present

## 2015-11-28 DIAGNOSIS — R1310 Dysphagia, unspecified: Secondary | ICD-10-CM | POA: Diagnosis not present

## 2015-11-28 DIAGNOSIS — S322XXD Fracture of coccyx, subsequent encounter for fracture with routine healing: Secondary | ICD-10-CM | POA: Diagnosis not present

## 2015-11-28 DIAGNOSIS — Z5181 Encounter for therapeutic drug level monitoring: Secondary | ICD-10-CM | POA: Diagnosis not present

## 2015-11-28 DIAGNOSIS — R627 Adult failure to thrive: Secondary | ICD-10-CM | POA: Diagnosis not present

## 2015-11-28 DIAGNOSIS — I4891 Unspecified atrial fibrillation: Secondary | ICD-10-CM | POA: Diagnosis not present

## 2015-11-30 DIAGNOSIS — R1310 Dysphagia, unspecified: Secondary | ICD-10-CM | POA: Diagnosis not present

## 2015-11-30 DIAGNOSIS — Z5181 Encounter for therapeutic drug level monitoring: Secondary | ICD-10-CM | POA: Diagnosis not present

## 2015-11-30 DIAGNOSIS — I2510 Atherosclerotic heart disease of native coronary artery without angina pectoris: Secondary | ICD-10-CM | POA: Diagnosis not present

## 2015-11-30 DIAGNOSIS — I4891 Unspecified atrial fibrillation: Secondary | ICD-10-CM | POA: Diagnosis not present

## 2015-11-30 DIAGNOSIS — S322XXD Fracture of coccyx, subsequent encounter for fracture with routine healing: Secondary | ICD-10-CM | POA: Diagnosis not present

## 2015-11-30 DIAGNOSIS — Z7901 Long term (current) use of anticoagulants: Secondary | ICD-10-CM | POA: Diagnosis not present

## 2015-11-30 DIAGNOSIS — R627 Adult failure to thrive: Secondary | ICD-10-CM | POA: Diagnosis not present

## 2015-12-01 DIAGNOSIS — M545 Low back pain: Secondary | ICD-10-CM | POA: Diagnosis not present

## 2015-12-01 DIAGNOSIS — Z5181 Encounter for therapeutic drug level monitoring: Secondary | ICD-10-CM | POA: Diagnosis not present

## 2015-12-01 DIAGNOSIS — M6281 Muscle weakness (generalized): Secondary | ICD-10-CM | POA: Diagnosis not present

## 2015-12-01 DIAGNOSIS — S322XXD Fracture of coccyx, subsequent encounter for fracture with routine healing: Secondary | ICD-10-CM | POA: Diagnosis not present

## 2015-12-01 DIAGNOSIS — Z7901 Long term (current) use of anticoagulants: Secondary | ICD-10-CM | POA: Diagnosis not present

## 2015-12-01 DIAGNOSIS — I2510 Atherosclerotic heart disease of native coronary artery without angina pectoris: Secondary | ICD-10-CM | POA: Diagnosis not present

## 2015-12-01 DIAGNOSIS — M4850XA Collapsed vertebra, not elsewhere classified, site unspecified, initial encounter for fracture: Secondary | ICD-10-CM | POA: Diagnosis not present

## 2015-12-01 DIAGNOSIS — R1310 Dysphagia, unspecified: Secondary | ICD-10-CM | POA: Diagnosis not present

## 2015-12-01 DIAGNOSIS — I4891 Unspecified atrial fibrillation: Secondary | ICD-10-CM | POA: Diagnosis not present

## 2015-12-01 DIAGNOSIS — R627 Adult failure to thrive: Secondary | ICD-10-CM | POA: Diagnosis not present

## 2015-12-02 DIAGNOSIS — R1310 Dysphagia, unspecified: Secondary | ICD-10-CM | POA: Diagnosis not present

## 2015-12-02 DIAGNOSIS — I4891 Unspecified atrial fibrillation: Secondary | ICD-10-CM | POA: Diagnosis not present

## 2015-12-02 DIAGNOSIS — S322XXD Fracture of coccyx, subsequent encounter for fracture with routine healing: Secondary | ICD-10-CM | POA: Diagnosis not present

## 2015-12-02 DIAGNOSIS — R627 Adult failure to thrive: Secondary | ICD-10-CM | POA: Diagnosis not present

## 2015-12-02 DIAGNOSIS — Z7901 Long term (current) use of anticoagulants: Secondary | ICD-10-CM | POA: Diagnosis not present

## 2015-12-02 DIAGNOSIS — Z5181 Encounter for therapeutic drug level monitoring: Secondary | ICD-10-CM | POA: Diagnosis not present

## 2015-12-02 DIAGNOSIS — I2510 Atherosclerotic heart disease of native coronary artery without angina pectoris: Secondary | ICD-10-CM | POA: Diagnosis not present

## 2015-12-05 DIAGNOSIS — S322XXD Fracture of coccyx, subsequent encounter for fracture with routine healing: Secondary | ICD-10-CM | POA: Diagnosis not present

## 2015-12-05 DIAGNOSIS — R627 Adult failure to thrive: Secondary | ICD-10-CM | POA: Diagnosis not present

## 2015-12-05 DIAGNOSIS — I2510 Atherosclerotic heart disease of native coronary artery without angina pectoris: Secondary | ICD-10-CM | POA: Diagnosis not present

## 2015-12-05 DIAGNOSIS — R1310 Dysphagia, unspecified: Secondary | ICD-10-CM | POA: Diagnosis not present

## 2015-12-05 DIAGNOSIS — Z5181 Encounter for therapeutic drug level monitoring: Secondary | ICD-10-CM | POA: Diagnosis not present

## 2015-12-05 DIAGNOSIS — Z7901 Long term (current) use of anticoagulants: Secondary | ICD-10-CM | POA: Diagnosis not present

## 2015-12-05 DIAGNOSIS — I4891 Unspecified atrial fibrillation: Secondary | ICD-10-CM | POA: Diagnosis not present

## 2015-12-08 DIAGNOSIS — Z7901 Long term (current) use of anticoagulants: Secondary | ICD-10-CM | POA: Diagnosis not present

## 2015-12-08 DIAGNOSIS — S322XXD Fracture of coccyx, subsequent encounter for fracture with routine healing: Secondary | ICD-10-CM | POA: Diagnosis not present

## 2015-12-08 DIAGNOSIS — I2510 Atherosclerotic heart disease of native coronary artery without angina pectoris: Secondary | ICD-10-CM | POA: Diagnosis not present

## 2015-12-08 DIAGNOSIS — R1310 Dysphagia, unspecified: Secondary | ICD-10-CM | POA: Diagnosis not present

## 2015-12-08 DIAGNOSIS — Z5181 Encounter for therapeutic drug level monitoring: Secondary | ICD-10-CM | POA: Diagnosis not present

## 2015-12-08 DIAGNOSIS — I4891 Unspecified atrial fibrillation: Secondary | ICD-10-CM | POA: Diagnosis not present

## 2015-12-08 DIAGNOSIS — R627 Adult failure to thrive: Secondary | ICD-10-CM | POA: Diagnosis not present

## 2015-12-21 ENCOUNTER — Other Ambulatory Visit: Payer: Self-pay | Admitting: Cardiology

## 2015-12-22 DIAGNOSIS — Z7901 Long term (current) use of anticoagulants: Secondary | ICD-10-CM | POA: Diagnosis not present

## 2015-12-22 DIAGNOSIS — R531 Weakness: Secondary | ICD-10-CM | POA: Diagnosis not present

## 2015-12-22 DIAGNOSIS — Z5181 Encounter for therapeutic drug level monitoring: Secondary | ICD-10-CM | POA: Diagnosis not present

## 2015-12-22 DIAGNOSIS — I4891 Unspecified atrial fibrillation: Secondary | ICD-10-CM | POA: Diagnosis not present

## 2015-12-22 DIAGNOSIS — R269 Unspecified abnormalities of gait and mobility: Secondary | ICD-10-CM | POA: Diagnosis not present

## 2015-12-24 DIAGNOSIS — R269 Unspecified abnormalities of gait and mobility: Secondary | ICD-10-CM | POA: Diagnosis not present

## 2015-12-24 DIAGNOSIS — R531 Weakness: Secondary | ICD-10-CM | POA: Diagnosis not present

## 2015-12-24 DIAGNOSIS — Z7901 Long term (current) use of anticoagulants: Secondary | ICD-10-CM | POA: Diagnosis not present

## 2015-12-24 DIAGNOSIS — I4891 Unspecified atrial fibrillation: Secondary | ICD-10-CM | POA: Diagnosis not present

## 2015-12-24 DIAGNOSIS — Z5181 Encounter for therapeutic drug level monitoring: Secondary | ICD-10-CM | POA: Diagnosis not present

## 2015-12-26 DIAGNOSIS — I4891 Unspecified atrial fibrillation: Secondary | ICD-10-CM | POA: Diagnosis not present

## 2015-12-26 DIAGNOSIS — Z7901 Long term (current) use of anticoagulants: Secondary | ICD-10-CM | POA: Diagnosis not present

## 2015-12-26 DIAGNOSIS — R269 Unspecified abnormalities of gait and mobility: Secondary | ICD-10-CM | POA: Diagnosis not present

## 2015-12-26 DIAGNOSIS — R531 Weakness: Secondary | ICD-10-CM | POA: Diagnosis not present

## 2015-12-26 DIAGNOSIS — Z5181 Encounter for therapeutic drug level monitoring: Secondary | ICD-10-CM | POA: Diagnosis not present

## 2015-12-27 DIAGNOSIS — R531 Weakness: Secondary | ICD-10-CM | POA: Diagnosis not present

## 2015-12-27 DIAGNOSIS — R269 Unspecified abnormalities of gait and mobility: Secondary | ICD-10-CM | POA: Diagnosis not present

## 2015-12-27 DIAGNOSIS — Z7901 Long term (current) use of anticoagulants: Secondary | ICD-10-CM | POA: Diagnosis not present

## 2015-12-27 DIAGNOSIS — I4891 Unspecified atrial fibrillation: Secondary | ICD-10-CM | POA: Diagnosis not present

## 2015-12-27 DIAGNOSIS — Z5181 Encounter for therapeutic drug level monitoring: Secondary | ICD-10-CM | POA: Diagnosis not present

## 2015-12-29 DIAGNOSIS — Z7901 Long term (current) use of anticoagulants: Secondary | ICD-10-CM | POA: Diagnosis not present

## 2015-12-29 DIAGNOSIS — Z5181 Encounter for therapeutic drug level monitoring: Secondary | ICD-10-CM | POA: Diagnosis not present

## 2015-12-29 DIAGNOSIS — I4891 Unspecified atrial fibrillation: Secondary | ICD-10-CM | POA: Diagnosis not present

## 2015-12-29 DIAGNOSIS — R269 Unspecified abnormalities of gait and mobility: Secondary | ICD-10-CM | POA: Diagnosis not present

## 2015-12-29 DIAGNOSIS — R531 Weakness: Secondary | ICD-10-CM | POA: Diagnosis not present

## 2015-12-30 DIAGNOSIS — I4891 Unspecified atrial fibrillation: Secondary | ICD-10-CM | POA: Diagnosis not present

## 2015-12-30 DIAGNOSIS — R531 Weakness: Secondary | ICD-10-CM | POA: Diagnosis not present

## 2015-12-30 DIAGNOSIS — Z5181 Encounter for therapeutic drug level monitoring: Secondary | ICD-10-CM | POA: Diagnosis not present

## 2015-12-30 DIAGNOSIS — R269 Unspecified abnormalities of gait and mobility: Secondary | ICD-10-CM | POA: Diagnosis not present

## 2015-12-30 DIAGNOSIS — Z7901 Long term (current) use of anticoagulants: Secondary | ICD-10-CM | POA: Diagnosis not present

## 2016-01-04 DIAGNOSIS — I4891 Unspecified atrial fibrillation: Secondary | ICD-10-CM | POA: Diagnosis not present

## 2016-01-04 DIAGNOSIS — R269 Unspecified abnormalities of gait and mobility: Secondary | ICD-10-CM | POA: Diagnosis not present

## 2016-01-04 DIAGNOSIS — R531 Weakness: Secondary | ICD-10-CM | POA: Diagnosis not present

## 2016-01-04 DIAGNOSIS — Z5181 Encounter for therapeutic drug level monitoring: Secondary | ICD-10-CM | POA: Diagnosis not present

## 2016-01-04 DIAGNOSIS — Z7901 Long term (current) use of anticoagulants: Secondary | ICD-10-CM | POA: Diagnosis not present

## 2016-01-05 ENCOUNTER — Other Ambulatory Visit (INDEPENDENT_AMBULATORY_CARE_PROVIDER_SITE_OTHER): Payer: Self-pay | Admitting: Internal Medicine

## 2016-01-05 DIAGNOSIS — R269 Unspecified abnormalities of gait and mobility: Secondary | ICD-10-CM | POA: Diagnosis not present

## 2016-01-05 DIAGNOSIS — R531 Weakness: Secondary | ICD-10-CM | POA: Diagnosis not present

## 2016-01-05 DIAGNOSIS — Z5181 Encounter for therapeutic drug level monitoring: Secondary | ICD-10-CM | POA: Diagnosis not present

## 2016-01-05 DIAGNOSIS — Z7901 Long term (current) use of anticoagulants: Secondary | ICD-10-CM | POA: Diagnosis not present

## 2016-01-05 DIAGNOSIS — I4891 Unspecified atrial fibrillation: Secondary | ICD-10-CM | POA: Diagnosis not present

## 2016-01-10 ENCOUNTER — Other Ambulatory Visit (HOSPITAL_COMMUNITY)
Admission: AD | Admit: 2016-01-10 | Discharge: 2016-01-10 | Disposition: A | Discharge status: Home / Self Care | Payer: Medicare Other | Source: Skilled Nursing Facility | Attending: Pulmonary Disease | Admitting: Pulmonary Disease

## 2016-01-10 DIAGNOSIS — I4891 Unspecified atrial fibrillation: Secondary | ICD-10-CM | POA: Diagnosis not present

## 2016-01-10 DIAGNOSIS — R531 Weakness: Secondary | ICD-10-CM | POA: Diagnosis not present

## 2016-01-10 DIAGNOSIS — Z7901 Long term (current) use of anticoagulants: Secondary | ICD-10-CM | POA: Insufficient documentation

## 2016-01-10 DIAGNOSIS — R269 Unspecified abnormalities of gait and mobility: Secondary | ICD-10-CM | POA: Diagnosis not present

## 2016-01-10 DIAGNOSIS — Z5181 Encounter for therapeutic drug level monitoring: Secondary | ICD-10-CM | POA: Diagnosis not present

## 2016-01-10 LAB — PROTIME-INR
INR: 5.19 — AB (ref 0.00–1.49)
Prothrombin Time: 46.2 seconds — ABNORMAL HIGH (ref 11.6–15.2)

## 2016-01-11 DIAGNOSIS — Z7901 Long term (current) use of anticoagulants: Secondary | ICD-10-CM | POA: Diagnosis not present

## 2016-01-11 DIAGNOSIS — R531 Weakness: Secondary | ICD-10-CM | POA: Diagnosis not present

## 2016-01-11 DIAGNOSIS — R269 Unspecified abnormalities of gait and mobility: Secondary | ICD-10-CM | POA: Diagnosis not present

## 2016-01-11 DIAGNOSIS — I4891 Unspecified atrial fibrillation: Secondary | ICD-10-CM | POA: Diagnosis not present

## 2016-01-11 DIAGNOSIS — Z5181 Encounter for therapeutic drug level monitoring: Secondary | ICD-10-CM | POA: Diagnosis not present

## 2016-01-13 DIAGNOSIS — I4891 Unspecified atrial fibrillation: Secondary | ICD-10-CM | POA: Diagnosis not present

## 2016-01-13 DIAGNOSIS — Z7901 Long term (current) use of anticoagulants: Secondary | ICD-10-CM | POA: Diagnosis not present

## 2016-01-13 DIAGNOSIS — R531 Weakness: Secondary | ICD-10-CM | POA: Diagnosis not present

## 2016-01-13 DIAGNOSIS — R269 Unspecified abnormalities of gait and mobility: Secondary | ICD-10-CM | POA: Diagnosis not present

## 2016-01-13 DIAGNOSIS — Z5181 Encounter for therapeutic drug level monitoring: Secondary | ICD-10-CM | POA: Diagnosis not present

## 2016-01-17 ENCOUNTER — Other Ambulatory Visit (HOSPITAL_COMMUNITY)
Admission: AD | Admit: 2016-01-17 | Discharge: 2016-01-17 | Disposition: A | Discharge status: Home / Self Care | Payer: Medicare Other | Source: Other Acute Inpatient Hospital | Attending: Pulmonary Disease | Admitting: Pulmonary Disease

## 2016-01-17 DIAGNOSIS — R531 Weakness: Secondary | ICD-10-CM | POA: Insufficient documentation

## 2016-01-17 DIAGNOSIS — Z5181 Encounter for therapeutic drug level monitoring: Secondary | ICD-10-CM | POA: Insufficient documentation

## 2016-01-17 DIAGNOSIS — R269 Unspecified abnormalities of gait and mobility: Secondary | ICD-10-CM | POA: Diagnosis not present

## 2016-01-17 DIAGNOSIS — I4891 Unspecified atrial fibrillation: Secondary | ICD-10-CM | POA: Diagnosis not present

## 2016-01-17 DIAGNOSIS — Z7901 Long term (current) use of anticoagulants: Secondary | ICD-10-CM | POA: Diagnosis not present

## 2016-01-17 LAB — COMPREHENSIVE METABOLIC PANEL
ALBUMIN: 3.3 g/dL — AB (ref 3.5–5.0)
ALT: 16 U/L — ABNORMAL LOW (ref 17–63)
ANION GAP: 7 (ref 5–15)
AST: 21 U/L (ref 15–41)
Alkaline Phosphatase: 149 U/L — ABNORMAL HIGH (ref 38–126)
BUN: 13 mg/dL (ref 6–20)
CHLORIDE: 103 mmol/L (ref 101–111)
CO2: 28 mmol/L (ref 22–32)
Calcium: 8.7 mg/dL — ABNORMAL LOW (ref 8.9–10.3)
Creatinine, Ser: 1.03 mg/dL (ref 0.61–1.24)
GFR calc Af Amer: >60 mL/min (ref >60)
GFR calc non Af Amer: >60 mL/min (ref >60)
GLUCOSE: 123 mg/dL — AB (ref 65–99)
Potassium: 4.4 mmol/L (ref 3.5–5.1)
SODIUM: 138 mmol/L (ref 135–145)
Total Bilirubin: 0.7 mg/dL (ref 0.3–1.2)
Total Protein: 6.7 g/dL (ref 6.5–8.1)

## 2016-01-17 LAB — CBC WITH DIFFERENTIAL/PLATELET
BASOS PCT: 1 %
Basophils Absolute: 0 10*3/uL (ref 0.0–0.1)
EOS ABS: 0.2 10*3/uL (ref 0.0–0.7)
Eosinophils Relative: 3 %
HCT: 32.5 % — ABNORMAL LOW (ref 39.0–52.0)
HEMOGLOBIN: 10.3 g/dL — AB (ref 13.0–17.0)
LYMPHS ABS: 1.7 10*3/uL (ref 0.7–4.0)
Lymphocytes Relative: 28 %
MCH: 30 pg (ref 26.0–34.0)
MCHC: 31.7 g/dL (ref 30.0–36.0)
MCV: 94.8 fL (ref 78.0–100.0)
MONOS PCT: 12 %
Monocytes Absolute: 0.7 10*3/uL (ref 0.1–1.0)
NEUTROS ABS: 3.5 10*3/uL (ref 1.7–7.7)
NEUTROS PCT: 56 %
PLATELETS: 234 10*3/uL (ref 150–400)
RBC: 3.43 MIL/uL — AB (ref 4.22–5.81)
RDW: 16.3 % — ABNORMAL HIGH (ref 11.5–15.5)
WBC: 6.2 10*3/uL (ref 4.0–10.5)

## 2016-01-22 DIAGNOSIS — T1490 Injury, unspecified: Secondary | ICD-10-CM | POA: Diagnosis not present

## 2016-01-24 DIAGNOSIS — R269 Unspecified abnormalities of gait and mobility: Secondary | ICD-10-CM | POA: Diagnosis not present

## 2016-01-24 DIAGNOSIS — I4891 Unspecified atrial fibrillation: Secondary | ICD-10-CM | POA: Diagnosis not present

## 2016-01-24 DIAGNOSIS — R531 Weakness: Secondary | ICD-10-CM | POA: Diagnosis not present

## 2016-01-24 DIAGNOSIS — Z7901 Long term (current) use of anticoagulants: Secondary | ICD-10-CM | POA: Diagnosis not present

## 2016-01-24 DIAGNOSIS — Z5181 Encounter for therapeutic drug level monitoring: Secondary | ICD-10-CM | POA: Diagnosis not present

## 2016-02-01 DIAGNOSIS — M545 Low back pain: Secondary | ICD-10-CM | POA: Diagnosis not present

## 2016-02-01 DIAGNOSIS — I4891 Unspecified atrial fibrillation: Secondary | ICD-10-CM | POA: Diagnosis not present

## 2016-02-01 DIAGNOSIS — N138 Other obstructive and reflux uropathy: Secondary | ICD-10-CM | POA: Diagnosis not present

## 2016-02-01 DIAGNOSIS — Z7901 Long term (current) use of anticoagulants: Secondary | ICD-10-CM | POA: Diagnosis not present

## 2016-02-01 DIAGNOSIS — K210 Gastro-esophageal reflux disease with esophagitis: Secondary | ICD-10-CM | POA: Diagnosis not present

## 2016-02-07 ENCOUNTER — Other Ambulatory Visit (HOSPITAL_COMMUNITY): Payer: Self-pay | Admitting: Pulmonary Disease

## 2016-02-07 DIAGNOSIS — R0781 Pleurodynia: Secondary | ICD-10-CM

## 2016-02-07 DIAGNOSIS — M545 Low back pain: Secondary | ICD-10-CM

## 2016-02-08 ENCOUNTER — Encounter (HOSPITAL_COMMUNITY): Payer: Medicare Other

## 2016-02-09 ENCOUNTER — Encounter (HOSPITAL_COMMUNITY)
Admission: RE | Admit: 2016-02-09 | Discharge: 2016-02-09 | Disposition: A | Discharge status: Home / Self Care | Payer: Medicare Other | Source: Ambulatory Visit | Attending: Pulmonary Disease | Admitting: Pulmonary Disease

## 2016-02-09 ENCOUNTER — Encounter (HOSPITAL_COMMUNITY): Payer: Self-pay

## 2016-02-09 DIAGNOSIS — R0781 Pleurodynia: Secondary | ICD-10-CM | POA: Diagnosis not present

## 2016-02-09 DIAGNOSIS — R937 Abnormal findings on diagnostic imaging of other parts of musculoskeletal system: Secondary | ICD-10-CM | POA: Insufficient documentation

## 2016-02-09 DIAGNOSIS — M545 Low back pain: Secondary | ICD-10-CM | POA: Insufficient documentation

## 2016-02-09 HISTORY — DX: Malignant (primary) neoplasm, unspecified: C80.1

## 2016-02-09 MED ORDER — TECHNETIUM TC 99M MEDRONATE IV KIT
25.0000 | PACK | Freq: Once | INTRAVENOUS | Status: AC | PRN
  Administered 2016-02-09: 25 via INTRAVENOUS

## 2016-02-21 ENCOUNTER — Ambulatory Visit (INDEPENDENT_AMBULATORY_CARE_PROVIDER_SITE_OTHER): Payer: Medicare Other | Admitting: Internal Medicine

## 2016-03-19 DIAGNOSIS — Z7901 Long term (current) use of anticoagulants: Secondary | ICD-10-CM | POA: Diagnosis not present

## 2016-03-23 DIAGNOSIS — M6281 Muscle weakness (generalized): Secondary | ICD-10-CM | POA: Diagnosis not present

## 2016-03-23 DIAGNOSIS — E785 Hyperlipidemia, unspecified: Secondary | ICD-10-CM | POA: Diagnosis not present

## 2016-03-23 DIAGNOSIS — Z7901 Long term (current) use of anticoagulants: Secondary | ICD-10-CM | POA: Diagnosis not present

## 2016-03-23 DIAGNOSIS — R1310 Dysphagia, unspecified: Secondary | ICD-10-CM | POA: Diagnosis not present

## 2016-03-23 DIAGNOSIS — I4891 Unspecified atrial fibrillation: Secondary | ICD-10-CM | POA: Diagnosis not present

## 2016-03-23 DIAGNOSIS — N139 Obstructive and reflux uropathy, unspecified: Secondary | ICD-10-CM | POA: Diagnosis not present

## 2016-03-23 DIAGNOSIS — K580 Irritable bowel syndrome with diarrhea: Secondary | ICD-10-CM | POA: Diagnosis not present

## 2016-03-23 DIAGNOSIS — K210 Gastro-esophageal reflux disease with esophagitis: Secondary | ICD-10-CM | POA: Diagnosis not present

## 2016-03-23 DIAGNOSIS — R130 Aphagia: Secondary | ICD-10-CM | POA: Diagnosis not present

## 2016-03-27 DIAGNOSIS — I4891 Unspecified atrial fibrillation: Secondary | ICD-10-CM | POA: Diagnosis not present

## 2016-03-27 DIAGNOSIS — M6281 Muscle weakness (generalized): Secondary | ICD-10-CM | POA: Diagnosis not present

## 2016-03-27 DIAGNOSIS — K580 Irritable bowel syndrome with diarrhea: Secondary | ICD-10-CM | POA: Diagnosis not present

## 2016-03-27 DIAGNOSIS — F419 Anxiety disorder, unspecified: Secondary | ICD-10-CM | POA: Diagnosis not present

## 2016-03-27 DIAGNOSIS — R1310 Dysphagia, unspecified: Secondary | ICD-10-CM | POA: Diagnosis not present

## 2016-03-27 DIAGNOSIS — N139 Obstructive and reflux uropathy, unspecified: Secondary | ICD-10-CM | POA: Diagnosis not present

## 2016-03-27 DIAGNOSIS — E785 Hyperlipidemia, unspecified: Secondary | ICD-10-CM | POA: Diagnosis not present

## 2016-03-27 DIAGNOSIS — Z7901 Long term (current) use of anticoagulants: Secondary | ICD-10-CM | POA: Diagnosis not present

## 2016-03-27 DIAGNOSIS — K210 Gastro-esophageal reflux disease with esophagitis: Secondary | ICD-10-CM | POA: Diagnosis not present

## 2016-03-29 DIAGNOSIS — R1310 Dysphagia, unspecified: Secondary | ICD-10-CM | POA: Diagnosis not present

## 2016-03-29 DIAGNOSIS — N139 Obstructive and reflux uropathy, unspecified: Secondary | ICD-10-CM | POA: Diagnosis not present

## 2016-03-29 DIAGNOSIS — I4891 Unspecified atrial fibrillation: Secondary | ICD-10-CM | POA: Diagnosis not present

## 2016-03-29 DIAGNOSIS — K210 Gastro-esophageal reflux disease with esophagitis: Secondary | ICD-10-CM | POA: Diagnosis not present

## 2016-03-29 DIAGNOSIS — E785 Hyperlipidemia, unspecified: Secondary | ICD-10-CM | POA: Diagnosis not present

## 2016-03-29 DIAGNOSIS — M6281 Muscle weakness (generalized): Secondary | ICD-10-CM | POA: Diagnosis not present

## 2016-03-29 DIAGNOSIS — F419 Anxiety disorder, unspecified: Secondary | ICD-10-CM | POA: Diagnosis not present

## 2016-03-29 DIAGNOSIS — Z7901 Long term (current) use of anticoagulants: Secondary | ICD-10-CM | POA: Diagnosis not present

## 2016-03-29 DIAGNOSIS — K580 Irritable bowel syndrome with diarrhea: Secondary | ICD-10-CM | POA: Diagnosis not present

## 2016-04-04 DIAGNOSIS — F419 Anxiety disorder, unspecified: Secondary | ICD-10-CM | POA: Diagnosis not present

## 2016-04-04 DIAGNOSIS — N139 Obstructive and reflux uropathy, unspecified: Secondary | ICD-10-CM | POA: Diagnosis not present

## 2016-04-04 DIAGNOSIS — Z7901 Long term (current) use of anticoagulants: Secondary | ICD-10-CM | POA: Diagnosis not present

## 2016-04-04 DIAGNOSIS — K210 Gastro-esophageal reflux disease with esophagitis: Secondary | ICD-10-CM | POA: Diagnosis not present

## 2016-04-04 DIAGNOSIS — M6281 Muscle weakness (generalized): Secondary | ICD-10-CM | POA: Diagnosis not present

## 2016-04-04 DIAGNOSIS — R1310 Dysphagia, unspecified: Secondary | ICD-10-CM | POA: Diagnosis not present

## 2016-04-04 DIAGNOSIS — I4891 Unspecified atrial fibrillation: Secondary | ICD-10-CM | POA: Diagnosis not present

## 2016-04-04 DIAGNOSIS — E785 Hyperlipidemia, unspecified: Secondary | ICD-10-CM | POA: Diagnosis not present

## 2016-04-04 DIAGNOSIS — K580 Irritable bowel syndrome with diarrhea: Secondary | ICD-10-CM | POA: Diagnosis not present

## 2016-04-06 DIAGNOSIS — F419 Anxiety disorder, unspecified: Secondary | ICD-10-CM | POA: Diagnosis not present

## 2016-04-06 DIAGNOSIS — N139 Obstructive and reflux uropathy, unspecified: Secondary | ICD-10-CM | POA: Diagnosis not present

## 2016-04-06 DIAGNOSIS — M6281 Muscle weakness (generalized): Secondary | ICD-10-CM | POA: Diagnosis not present

## 2016-04-06 DIAGNOSIS — K210 Gastro-esophageal reflux disease with esophagitis: Secondary | ICD-10-CM | POA: Diagnosis not present

## 2016-04-06 DIAGNOSIS — I4891 Unspecified atrial fibrillation: Secondary | ICD-10-CM | POA: Diagnosis not present

## 2016-04-06 DIAGNOSIS — E785 Hyperlipidemia, unspecified: Secondary | ICD-10-CM | POA: Diagnosis not present

## 2016-04-06 DIAGNOSIS — K580 Irritable bowel syndrome with diarrhea: Secondary | ICD-10-CM | POA: Diagnosis not present

## 2016-04-06 DIAGNOSIS — Z7901 Long term (current) use of anticoagulants: Secondary | ICD-10-CM | POA: Diagnosis not present

## 2016-04-06 DIAGNOSIS — R1310 Dysphagia, unspecified: Secondary | ICD-10-CM | POA: Diagnosis not present

## 2016-04-21 ENCOUNTER — Other Ambulatory Visit (INDEPENDENT_AMBULATORY_CARE_PROVIDER_SITE_OTHER): Payer: Self-pay | Admitting: Internal Medicine

## 2016-05-07 ENCOUNTER — Other Ambulatory Visit (HOSPITAL_COMMUNITY): Payer: Self-pay | Admitting: Pulmonary Disease

## 2016-05-07 DIAGNOSIS — IMO0001 Reserved for inherently not codable concepts without codable children: Secondary | ICD-10-CM

## 2016-05-07 DIAGNOSIS — Z7901 Long term (current) use of anticoagulants: Secondary | ICD-10-CM | POA: Diagnosis not present

## 2016-05-07 DIAGNOSIS — M545 Low back pain: Secondary | ICD-10-CM | POA: Diagnosis not present

## 2016-05-07 DIAGNOSIS — L03039 Cellulitis of unspecified toe: Secondary | ICD-10-CM | POA: Diagnosis not present

## 2016-05-07 DIAGNOSIS — I4891 Unspecified atrial fibrillation: Secondary | ICD-10-CM | POA: Diagnosis not present

## 2016-05-07 DIAGNOSIS — M4850XD Collapsed vertebra, not elsewhere classified, site unspecified, subsequent encounter for fracture with routine healing: Secondary | ICD-10-CM | POA: Diagnosis not present

## 2016-05-07 DIAGNOSIS — M4850XA Collapsed vertebra, not elsewhere classified, site unspecified, initial encounter for fracture: Principal | ICD-10-CM

## 2016-05-17 ENCOUNTER — Other Ambulatory Visit (HOSPITAL_COMMUNITY): Payer: Medicare Other

## 2016-05-17 ENCOUNTER — Ambulatory Visit (HOSPITAL_COMMUNITY): Payer: Medicare Other

## 2016-05-25 ENCOUNTER — Other Ambulatory Visit (INDEPENDENT_AMBULATORY_CARE_PROVIDER_SITE_OTHER): Payer: Self-pay | Admitting: *Deleted

## 2016-05-25 MED ORDER — ALPRAZOLAM 0.5 MG PO TABS: 0.5000 mg | ORAL_TABLET | Freq: Two times a day (BID) | ORAL | 2 refills | Status: DC

## 2016-06-20 DIAGNOSIS — Z7901 Long term (current) use of anticoagulants: Secondary | ICD-10-CM | POA: Diagnosis not present

## 2016-07-03 DIAGNOSIS — Z7901 Long term (current) use of anticoagulants: Secondary | ICD-10-CM | POA: Diagnosis not present

## 2016-07-05 DIAGNOSIS — L821 Other seborrheic keratosis: Secondary | ICD-10-CM | POA: Diagnosis not present

## 2016-07-05 DIAGNOSIS — D225 Melanocytic nevi of trunk: Secondary | ICD-10-CM | POA: Diagnosis not present

## 2016-07-05 DIAGNOSIS — C44222 Squamous cell carcinoma of skin of right ear and external auricular canal: Secondary | ICD-10-CM | POA: Diagnosis not present

## 2016-07-09 DIAGNOSIS — K210 Gastro-esophageal reflux disease with esophagitis: Secondary | ICD-10-CM | POA: Diagnosis not present

## 2016-07-09 DIAGNOSIS — M542 Cervicalgia: Secondary | ICD-10-CM | POA: Diagnosis not present

## 2016-07-09 DIAGNOSIS — M545 Low back pain: Secondary | ICD-10-CM | POA: Diagnosis not present

## 2016-07-09 DIAGNOSIS — Z7901 Long term (current) use of anticoagulants: Secondary | ICD-10-CM | POA: Diagnosis not present

## 2016-07-09 DIAGNOSIS — I4891 Unspecified atrial fibrillation: Secondary | ICD-10-CM | POA: Diagnosis not present

## 2016-07-30 DIAGNOSIS — Z85828 Personal history of other malignant neoplasm of skin: Secondary | ICD-10-CM | POA: Diagnosis not present

## 2016-07-30 DIAGNOSIS — Z08 Encounter for follow-up examination after completed treatment for malignant neoplasm: Secondary | ICD-10-CM | POA: Diagnosis not present

## 2016-08-27 ENCOUNTER — Other Ambulatory Visit: Payer: Self-pay | Admitting: *Deleted

## 2016-08-27 MED ORDER — DIGOXIN 125 MCG PO TABS: 62.5000 ug | ORAL_TABLET | Freq: Every day | ORAL | 0 refills | Status: DC

## 2016-09-24 ENCOUNTER — Other Ambulatory Visit: Payer: Self-pay | Admitting: Cardiology

## 2016-09-25 NOTE — Progress Notes (Signed)
**Note Sergio-Identified via Obfuscation** Cardiology Office Note  Date: 09/26/2016   ID: PARMVEER Sergio Sergio Boyle, DOB [birth year], MRN [id number]  PCP: Alonza Bogus, MD  Primary Cardiologist: Rozann Lesches, MD   Chief Complaint  Patient presents with  . Coronary Artery Disease  . Atrial Fibrillation    History of Present Illness: Sergio Sergio Boyle is an 82 y.o. male last seen in October 2016. He is here today with a friend. Reports no major change from a cardiac perspective since I last saw him. He does not have angina symptoms, has not used any nitroglycerin. He is fairly sedentary, is in a wheelchair today. Functions with basic ADLs. Also limited by chronic back pain.  He continues on Coumadin with INR followed by Dr. Luan Pulling. Reports no major bleeding problems.  Ischemic testing from 2015 is outlined below. Overall low risk. Would continue with medical therapy and observation at this point.  Past Medical History:  Diagnosis Date  . Atrial fibrillation ([place])   . Cancer ([place])   . Coronary atherosclerosis of native coronary artery    Prior stenting of LAD and diagonal at [facility] with subsequent SVG to diagonal, LVEF 55-60%  . Dysphagia   . GERD   . Hiccups   . History of prostate cancer   . Mixed hyperlipidemia   . Weight loss     Current Outpatient Prescriptions  Medication Sig Dispense Refill  . ALPRAZolam (XANAX) 0.5 MG tablet Take 1 tablet (0.5 mg total) by mouth 2 (two) times daily. 60 tablet 2  . alum & mag hydroxide-simeth (MYLANTA) [id number] MG/5ML suspension Take 10 mLs by mouth 2 (two) times daily as needed for indigestion or heartburn.     Marland Kitchen atorvastatin (LIPITOR) 20 MG tablet Take 20 mg by mouth daily.      . cholecalciferol (VITAMIN D) 1000 UNITS tablet Take 1,000 Units by mouth daily.    . digoxin (LANOXIN) 0.125 MG tablet TAKE ONE-HALF TABLET BY MOUTH DAILY. 30 tablet 3  . docusate sodium (COLACE) 100 MG capsule As needed if you go more than 24 hours without bowel movement while taking pain medication 30  capsule 0  . HYDROcodone-acetaminophen (NORCO/VICODIN) 5-325 MG per tablet Take 1 tablet by mouth as needed for moderate pain.    . magnesium oxide (MAG-OX 400) 400 MG tablet Take 1 tablet (400 mg total) by mouth every 7 (seven) days.    Marland Kitchen oxyCODONE-acetaminophen (PERCOCET/ROXICET) 5-325 MG tablet Take 1 tablet by mouth every 4 (four) hours as needed. 20 tablet 0  . pantoprazole (PROTONIX) 40 MG tablet TAKE ONE (1) TABLET EACH DAY BEFORE BREAKFAST 30 tablet 6  . warfarin (COUMADIN) 3 MG tablet Take 3 mg by mouth daily.      No current facility-administered medications for this visit.    Allergies:  Sulfonamide derivatives   Social History: The patient  reports that he has never smoked. He has never used smokeless tobacco. He reports that he does not drink alcohol or use drugs.   ROS:  Please see the history of present illness. Otherwise, complete review of systems is positive for hearing loss.  All other systems are reviewed and negative.   Physical Exam: VS:  BP 126/64   Pulse 83   Ht 5\' 9"  (1.753 m)   Wt 162 lb (73.5 kg)   SpO2 98%   BMI 23.92 kg/m , BMI Body mass index is 23.92 kg/m.  Wt Readings from Last 3 Encounters:  09/26/16 162 lb (73.5 kg)  10/10/15 160 lb (72.6 kg)  08/25/15 168 lb 3.2 oz (76.3 kg)    Elderly male, appears comfortable at rest.In wheelchair. HEENT: Conjunctiva and lids normal, oropharynx clear.  Neck: Supple, no elevated JVP or carotid bruits, no thyromegaly.  Lungs: Clear to auscultation, nonlabored breathing at rest.  Cardiac: Irregular, no S3, Soft systolic murmur, no pericardial rub.  Abdomen: Soft, nontender,bowel sounds present, no guarding or rebound.  Extremities: Mild ankle edema, distal pulses 2+.   ECG: I personally reviewed the tracing from 02/22/2015 which showed rate-controlled atrial fibrillation with right bundle branch block.  Recent Labwork: 01/17/2016: ALT 16; AST 21; BUN 13; Creatinine, Ser 1.03; Hemoglobin 10.3; Platelets  234; Potassium 4.4; Sodium 138   Other Studies Reviewed Today:  Lexiscan Cardiolite 11/17/2013: IMPRESSION: Low risk Lexiscan Cardiolite. Atrial fibrillation with right bundle branch block present at baseline, no diagnostic ST segment abnormalities noted. Perfusion imaging is most consistent with soft tissue attenuation affecting the apical anteroseptal and anterolateral wall, no definite ischemia. LV volumes are normal with LVEF 77% and no wall motion abnormalities.  Assessment and Plan:  1. CAD status post prior interventions to the LAD and diagonal and subsequent SVG to diagonal at [facility]. He is clinically stable without angina symptoms on medical therapy, had a low risk Cardiolite study in 2015. Plan is to continue medical therapy and observation.  2. Hyperlipidemia, on Lipitor. He follows with Dr. Luan Pulling.  3. Chronic atrial fibrillation, on Lanoxin for rate control and Coumadin, followed by Dr. Luan Pulling.   Current medicines were reviewed with the patient today.   Orders Placed This Encounter  Procedures  . EKG 12-Lead    Disposition: Follow-up in 6 months.  Signed, Satira Sark, MD, [facility] 09/26/2016 3:33 PM    [facility] [address] [place], [place] [address] Phone: [phone]; Fax: [phone]

## 2016-09-26 ENCOUNTER — Ambulatory Visit (INDEPENDENT_AMBULATORY_CARE_PROVIDER_SITE_OTHER): Payer: Medicare Other | Admitting: Cardiology

## 2016-09-26 ENCOUNTER — Encounter: Payer: Self-pay | Admitting: Cardiology

## 2016-09-26 VITALS — BP 126/64 | HR 83 | Ht 69.0 in | Wt 162.0 lb

## 2016-09-26 DIAGNOSIS — E782 Mixed hyperlipidemia: Secondary | ICD-10-CM

## 2016-09-26 DIAGNOSIS — I482 Chronic atrial fibrillation, unspecified: Secondary | ICD-10-CM

## 2016-09-26 DIAGNOSIS — I2510 Atherosclerotic heart disease of native coronary artery without angina pectoris: Secondary | ICD-10-CM | POA: Diagnosis not present

## 2016-09-26 MED ORDER — DIGOXIN 125 MCG PO TABS: 62.5000 ug | ORAL_TABLET | Freq: Every day | ORAL | 3 refills | Status: DC

## 2016-09-26 NOTE — Patient Instructions (Signed)

## 2016-10-26 ENCOUNTER — Other Ambulatory Visit (INDEPENDENT_AMBULATORY_CARE_PROVIDER_SITE_OTHER): Payer: Self-pay | Admitting: Internal Medicine

## 2016-11-07 ENCOUNTER — Other Ambulatory Visit (INDEPENDENT_AMBULATORY_CARE_PROVIDER_SITE_OTHER): Payer: Self-pay | Admitting: Internal Medicine

## 2016-11-20 ENCOUNTER — Other Ambulatory Visit (INDEPENDENT_AMBULATORY_CARE_PROVIDER_SITE_OTHER): Payer: Self-pay | Admitting: Internal Medicine

## 2016-11-20 NOTE — Telephone Encounter (Signed)
Dr.Rehman states that the patient will need to have OV prior to refills.

## 2016-12-19 ENCOUNTER — Encounter (INDEPENDENT_AMBULATORY_CARE_PROVIDER_SITE_OTHER): Payer: Self-pay | Admitting: Internal Medicine

## 2016-12-19 ENCOUNTER — Encounter (INDEPENDENT_AMBULATORY_CARE_PROVIDER_SITE_OTHER): Payer: Self-pay

## 2016-12-19 NOTE — Telephone Encounter (Signed)
Patient was given an appointment for 02/11/17 at 10:15am with Deberah Castle, NP.  A letter was mailed to the patient.

## 2017-01-14 ENCOUNTER — Telehealth (INDEPENDENT_AMBULATORY_CARE_PROVIDER_SITE_OTHER): Payer: Self-pay | Admitting: *Deleted

## 2017-01-14 NOTE — Telephone Encounter (Signed)
Patient has called the office and gave the following information. Having a lot of trouble swallowing.a lot of mucous. Takes 30 minutes to eat , he as been treated with K- flex for the mucous and this didn't help. No appetite.  A couple of months ago he weighed 159 lbs , he says that he now weighs 150 lbs. He is seen at the [facility] 1 x per months. Can't control bladder or bowels. He is now n wheel chair , and has a walker for the house. He is on Ensure powder.  He was last seen in the office 08/16/2015 , and currently has an appointment in April with Terri.  Forwarded to [place] for any further recommendations.

## 2017-01-15 NOTE — Telephone Encounter (Signed)
Patient was given an appointment for 01/22/17/ at 9:00am.  The patient was added to Terri's schedule with a note to bring Dr. Laural Golden in for the appointment as well.  The patient was informed by phone.

## 2017-01-15 NOTE — Telephone Encounter (Signed)
I'm afraid he needs to be seen in the office one day.

## 2017-01-22 ENCOUNTER — Encounter (INDEPENDENT_AMBULATORY_CARE_PROVIDER_SITE_OTHER): Payer: Self-pay | Admitting: Internal Medicine

## 2017-01-22 ENCOUNTER — Ambulatory Visit (INDEPENDENT_AMBULATORY_CARE_PROVIDER_SITE_OTHER): Payer: Medicare Other | Admitting: Internal Medicine

## 2017-01-22 VITALS — BP 104/64 | HR 72 | Temp 97.6°F | Ht 69.0 in | Wt 163.6 lb

## 2017-01-22 DIAGNOSIS — R1319 Other dysphagia: Secondary | ICD-10-CM

## 2017-01-22 DIAGNOSIS — R1310 Dysphagia, unspecified: Secondary | ICD-10-CM | POA: Diagnosis not present

## 2017-01-22 NOTE — Progress Notes (Signed)
Subjective:    Patient ID: Sergio Boyle, male    DOB: 1928/03/29, 83 y.o.   MRN: [id number]  HPI Presents today with c/o dysphagia. Symptoms for a long time. Worse in the last month. Hx of same. He says he has a lot of mucus.  He thinks the foods are hanging on the mucous. When he eats, he tries to clear his throat. No particular diet.  He eats hamburgers and sandwiches. He eats 3 meals a day. Chicken biscuit, [place] [employer] breakfast, Frozen fruts..  He sometimes has trouble swallowing liquids. His last weight in October, 2016 was 169. Today his weight was 163.6.  In 2016 he underwent and EGD/ED. Patient states his swallowing was somewhat better.  He says he does not have an appetite.  Goes to [place] in [place] once a year.  Drinks Ensure once a day.  In a w/c due to back pain.  BM x 1 a day. Leans toward constipation. Has been incontinent. Stools are formed. Incontinent of urine at times/    Hx of afib and maintained on Warfarin.   06/14/2015 EGD/ED:   Impression: No evidence of erosive esophagitis ring or stricture. Focal coating of proximal esophageal mucosa possibly due to mild Candida but could be food debris. 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  11/10/2014 IMPRESSION: Severe dysmotility with full column stasis into the cervical esophagus and severe mid to distal esophageal spasm. Review of Systems Past Medical History:  Diagnosis Date  . Atrial fibrillation ([place])   . Cancer ([place])   . Coronary atherosclerosis of native coronary artery    Prior stenting of LAD and diagonal at [facility] with subsequent SVG to diagonal, LVEF 55-60%  . Dysphagia   . GERD   . Hiccups   . History of prostate cancer   . Mixed hyperlipidemia   . Weight loss     Past Surgical History:  Procedure Laterality Date  . Bilateral inguinal hernia repair    . COLONOSCOPY   06/2010  . CORONARY ARTERY BYPASS GRAFT  1996    Emergent SVG to diagonal at [facility]  . ESOPHAGEAL DILATION N/A 06/10/2015   Procedure: ESOPHAGEAL DILATION;  Surgeon: Rogene Houston, MD;  [facility];  Service: Endoscopy;  Laterality: N/A;  . ESOPHAGOGASTRODUODENOSCOPY     w/esophageal dilation   . ESOPHAGOGASTRODUODENOSCOPY N/A 06/10/2015   Procedure: ESOPHAGOGASTRODUODENOSCOPY (EGD);  Surgeon: Rogene Houston, MD;  Location: [facility];  Service: Endoscopy;  Laterality: N/A;  245  . UPPER GASTROINTESTINAL ENDOSCOPY  06/2010    Allergies  Allergen Reactions  . Sulfonamide Derivatives     Patient says his wife says he's allergic to sulfa.    Current Outpatient Prescriptions on File Prior to Visit  Medication Sig Dispense Refill  . ALPRAZolam (XANAX) 0.5 MG tablet TAKE ONE TABLET BY MOUTH TWICE A DAY 60 tablet 2  . atorvastatin (LIPITOR) 20 MG tablet Take 20 mg by mouth daily.      . cholecalciferol (VITAMIN D) 1000 UNITS tablet Take 1,000 Units by mouth daily.    Marland Kitchen docusate sodium (COLACE) 100 MG capsule As needed if you go more than 24 hours without bowel movement while taking pain medication 30 capsule 0  . HYDROcodone-acetaminophen (NORCO/VICODIN) 5-325 MG per tablet Take 1 tablet by mouth as needed for moderate pain.    . magnesium oxide (MAG-OX 400) 400 MG tablet  Take 1 tablet (400 mg total) by mouth every 7 (seven) days.    . pantoprazole (PROTONIX) 40 MG tablet TAKE ONE TABLET EACH DAY BEFORE BREAKFAST 30 tablet 5  . warfarin (COUMADIN) 3 MG tablet Take 3 mg by mouth daily.     Marland Kitchen alum & mag hydroxide-simeth (MYLANTA) 200-200-20 MG/5ML suspension Take 10 mLs by mouth 2 (two) times daily as needed for indigestion or heartburn.     Marland Kitchen oxyCODONE-acetaminophen (PERCOCET/ROXICET) 5-325 MG tablet Take 1 tablet by mouth every 4 (four) hours as needed. 20 tablet 0   No current facility-administered medications on file prior to visit.        Objective:   Physical Exam Blood  pressure 104/64, pulse 72, temperature 97.6 F (36.4 C), height 5\' 9"  (1.753 m), weight 163 lb 9.6 oz (74.2 kg).  Alert and oriented. Skin warm and dry. Oral mucosa is moist.   . Sclera anicteric, conjunctivae is pink. Thyroid not enlarged. No cervical lymphadenopathy. Lungs clear. Heart regular rate and rhythm.  Abdomen is soft. Bowel sounds are positive. No hepatomegaly. No abdominal masses felt. No tenderness.  No edema to lower extremities.         Assessment & Plan:  Dysphagia. Continue present diet. Ensure x 2 daily. Try eating pudding daily. I discussed with Dr. Laural Golden. OV in 4 months.

## 2017-01-22 NOTE — Patient Instructions (Signed)
Continue the Ensure. Try drinking twice a day. Pudding daily. Continue current diet. OV in 4 months.

## 2017-01-22 NOTE — Progress Notes (Signed)
Subjective:    Patient ID: Sergio Boyle, male    DOB: 05-30-1928, 83 y.o.   MRN: [id number]  HPI Presents today with c/o dysphagia. Symptoms for a long time. Worse in the last month. Hx of same. He says he has a lot of mucus.  He thinks the foods are hanging on the mucous. When he eats, he tries to clear his throat. No particular diet.  He eats hamburgers and sandwiches. He sometimes has trouble swallowing liquids. His last weight in October, 2016 was 169. Today his weight was 163.6.  In 2016 he underwent and EGD/ED. Patient states his swallowing was somewhat better.  He says he does not have an appetite.  Goes to [place] in [place] once a year.  Drinks Ensure once a day.   06/14/2015 EGD/ED:   Impression: No evidence of erosive esophagitis ring or stricture. Focal coating of proximal esophageal mucosa possibly due to mild Candida but could be food debris. 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  11/10/2014 IMPRESSION: Severe dysmotility with full column stasis into the cervical esophagus and severe mid to distal esophageal spasm. Review of Systems Past Medical History:  Diagnosis Date  . Atrial fibrillation ([place])   . Cancer ([place])   . Coronary atherosclerosis of native coronary artery    Prior stenting of LAD and diagonal at [facility] with subsequent SVG to diagonal, LVEF 55-60%  . Dysphagia   . GERD   . Hiccups   . History of prostate cancer   . Mixed hyperlipidemia   . Weight loss     Past Surgical History:  Procedure Laterality Date  . Bilateral inguinal hernia repair    . COLONOSCOPY  06/2010  . CORONARY ARTERY BYPASS GRAFT  1996    Emergent SVG to diagonal at [facility]  . ESOPHAGEAL DILATION N/A 06/10/2015   Procedure: ESOPHAGEAL DILATION;  Surgeon: Rogene Houston, MD;  Location[facility];  Service: Endoscopy;  Laterality: N/A;  .  ESOPHAGOGASTRODUODENOSCOPY     w/esophageal dilation   . ESOPHAGOGASTRODUODENOSCOPY N/A 06/10/2015   Procedure: ESOPHAGOGASTRODUODENOSCOPY (EGD);  Surgeon: Rogene Houston, MD;  [facility];  Service: Endoscopy;  Laterality: N/A;  245  . UPPER GASTROINTESTINAL ENDOSCOPY  06/2010    Allergies  Allergen Reactions  . Sulfonamide Derivatives     Patient says his wife says he's allergic to sulfa.    Current Outpatient Prescriptions on File Prior to Visit  Medication Sig Dispense Refill  . ALPRAZolam (XANAX) 0.5 MG tablet TAKE ONE TABLET BY MOUTH TWICE A DAY 60 tablet 2  . atorvastatin (LIPITOR) 20 MG tablet Take 20 mg by mouth daily.      . cholecalciferol (VITAMIN D) 1000 UNITS tablet Take 1,000 Units by mouth daily.    Marland Kitchen docusate sodium (COLACE) 100 MG capsule As needed if you go more than 24 hours without bowel movement while taking pain medication 30 capsule 0  . HYDROcodone-acetaminophen (NORCO/VICODIN) 5-325 MG per tablet Take 1 tablet by mouth as needed for moderate pain.    . magnesium oxide (MAG-OX 400) 400 MG tablet Take 1 tablet (400 mg total) by mouth every 7 (seven) days.    . pantoprazole (PROTONIX) 40 MG tablet TAKE ONE TABLET EACH DAY BEFORE BREAKFAST 30 tablet 5  . warfarin (COUMADIN) 3 MG tablet Take 3 mg by mouth daily.     Marland Kitchen alum & mag  hydroxide-simeth (MYLANTA) 200-200-20 MG/5ML suspension Take 10 mLs by mouth 2 (two) times daily as needed for indigestion or heartburn.     Marland Kitchen oxyCODONE-acetaminophen (PERCOCET/ROXICET) 5-325 MG tablet Take 1 tablet by mouth every 4 (four) hours as needed. 20 tablet 0   No current facility-administered medications on file prior to visit.        Objective:   Physical Exam Blood pressure 104/64, pulse 72, temperature 97.6 F (36.4 C), height 5\' 9"  (1.753 m), weight 163 lb 9.6 oz (74.2 kg).  Alert and oriented. Skin warm and dry. Oral mucosa is moist.   . Sclera anicteric, conjunctivae is pink. Thyroid not enlarged. No cervical  lymphadenopathy. Lungs clear. Heart regular rate and rhythm.  Abdomen is soft. Bowel sounds are positive. No hepatomegaly. No abdominal masses felt. No tenderness.  No edema to lower extremities.         Assessment & Plan:  Dysphagia.

## 2017-01-25 ENCOUNTER — Emergency Department (HOSPITAL_COMMUNITY): Payer: Medicare Other

## 2017-01-25 ENCOUNTER — Emergency Department (HOSPITAL_COMMUNITY)
Admission: EM | Admit: 2017-01-25 | Discharge: 2017-01-25 | Disposition: A | Discharge status: Home / Self Care | Payer: Medicare Other | Attending: Emergency Medicine | Admitting: Emergency Medicine

## 2017-01-25 ENCOUNTER — Encounter (HOSPITAL_COMMUNITY): Payer: Self-pay

## 2017-01-25 DIAGNOSIS — Z7901 Long term (current) use of anticoagulants: Secondary | ICD-10-CM | POA: Insufficient documentation

## 2017-01-25 DIAGNOSIS — M545 Low back pain, unspecified: Secondary | ICD-10-CM

## 2017-01-25 DIAGNOSIS — Z8546 Personal history of malignant neoplasm of prostate: Secondary | ICD-10-CM | POA: Diagnosis not present

## 2017-01-25 DIAGNOSIS — S32050A Wedge compression fracture of fifth lumbar vertebra, initial encounter for closed fracture: Secondary | ICD-10-CM | POA: Diagnosis not present

## 2017-01-25 DIAGNOSIS — R32 Unspecified urinary incontinence: Secondary | ICD-10-CM | POA: Diagnosis not present

## 2017-01-25 DIAGNOSIS — I2510 Atherosclerotic heart disease of native coronary artery without angina pectoris: Secondary | ICD-10-CM | POA: Diagnosis not present

## 2017-01-25 DIAGNOSIS — S32040A Wedge compression fracture of fourth lumbar vertebra, initial encounter for closed fracture: Secondary | ICD-10-CM | POA: Diagnosis not present

## 2017-01-25 DIAGNOSIS — R52 Pain, unspecified: Secondary | ICD-10-CM | POA: Diagnosis not present

## 2017-01-25 DIAGNOSIS — Z79899 Other long term (current) drug therapy: Secondary | ICD-10-CM | POA: Insufficient documentation

## 2017-01-25 DIAGNOSIS — M4856XA Collapsed vertebra, not elsewhere classified, lumbar region, initial encounter for fracture: Secondary | ICD-10-CM | POA: Diagnosis not present

## 2017-01-25 DIAGNOSIS — M5489 Other dorsalgia: Secondary | ICD-10-CM | POA: Diagnosis not present

## 2017-01-25 DIAGNOSIS — R102 Pelvic and perineal pain: Secondary | ICD-10-CM | POA: Diagnosis not present

## 2017-01-25 MED ORDER — METHOCARBAMOL 500 MG PO TABS
500.0000 mg | ORAL_TABLET | Freq: Once | ORAL | Status: AC
  Administered 2017-01-25: 500 mg via ORAL
  Filled 2017-01-25: qty 1

## 2017-01-25 MED ORDER — METHOCARBAMOL 500 MG PO TABS: 500.0000 mg | ORAL_TABLET | Freq: Two times a day (BID) | ORAL | 0 refills | Status: DC

## 2017-01-25 MED ORDER — OXYCODONE-ACETAMINOPHEN 5-325 MG PO TABS
1.0000 | ORAL_TABLET | Freq: Once | ORAL | Status: AC
  Administered 2017-01-25: 1 via ORAL
  Filled 2017-01-25: qty 1

## 2017-01-25 NOTE — ED Triage Notes (Signed)
Pt brought in by EMS.Pt got on toilet today and was unable to get off of toilet for approx 1 1/2 hr. Pt reports pain in back has been increasing over last 2 days. Normally takes percocet for pain and uses walker to ambulate. Given morphine 4 mg IV and Toradol 30 IV per Dr Lacinda Axon. Pain has decreased to 4. Pt reports he has a bulging disc. No reported falls

## 2017-01-25 NOTE — ED Provider Notes (Signed)
[facility] DEPT Provider Note   CSN: [id number] Arrival date & time: 01/25/17  1325   By signing my name below, I, Hilbert Odor, attest that this documentation has been prepared under the direction and in the presence of Merrily Pew, MD. Electronically Signed: Hilbert Odor, Scribe. 01/25/17. 2:10 PM. History   Chief Complaint Chief Complaint  Patient presents with  . Back Pain    The history is provided by the patient. No language interpreter was used.  HPI Comments: [name] is a 83 y.o. male brought in by ambulance, who presents to the Emergency Department complaining of acute exacerbation of his chronic lower back pain that began earlier today. The patient states that he was on the toilet for about an hour unable to get up due to the pain in his lower back. The patient states that this has never happened before to where he has been unable to get off of the toilet.  His pain is much improved currently. He denies any urinary symptoms except for urinary incontinence due to prostate cancer. He also denies any recent falls. He states that he had fractured his back in 2016.  Past Medical History:  Diagnosis Date  . Atrial fibrillation ([place])   . Cancer ([place])   . Coronary atherosclerosis of native coronary artery    Prior stenting of LAD and diagonal at [facility] with subsequent SVG to diagonal, LVEF 55-60%  . Dysphagia   . GERD   . Hiccups   . History of prostate cancer   . Mixed hyperlipidemia   . Weight loss     Patient Active Problem List   Diagnosis Date Noted  . LBP (low back pain) 01/27/2014  . Chronic neck pain 01/27/2014  . Esophageal motility disorder 12/25/2011  . Coronary atherosclerosis of native coronary artery 05/09/2010  . Mixed hyperlipidemia 07/20/2009  . ATRIAL FIBRILLATION, CHRONIC 07/20/2009  . GERD 07/20/2009    Past Surgical History:  Procedure Laterality Date  . Bilateral inguinal hernia repair    . bulging disc    . COLONOSCOPY  06/2010    . CORONARY ARTERY BYPASS GRAFT  1996    Emergent SVG to diagonal at [facility]  . ESOPHAGEAL DILATION N/A 06/10/2015   Procedure: ESOPHAGEAL DILATION;  Surgeon: Rogene Houston, MD;  Location: [facility] ENDO SUITE;  Service: Endoscopy;  Laterality: N/A;  . ESOPHAGOGASTRODUODENOSCOPY     w/esophageal dilation   . ESOPHAGOGASTRODUODENOSCOPY N/A 06/10/2015   Procedure: ESOPHAGOGASTRODUODENOSCOPY (EGD);  Surgeon: Rogene Houston, MD;  Location: [facility] ENDO SUITE;  Service: Endoscopy;  Laterality: N/A;  245  . UPPER GASTROINTESTINAL ENDOSCOPY  06/2010       Home Medications    Prior to Admission medications   Medication Sig Start Date End Date Taking? Authorizing Provider  ALPRAZolam Duanne Moron) 0.5 MG tablet TAKE ONE TABLET BY MOUTH TWICE A DAY 11/08/16  Yes Rogene Houston, MD  alum & mag hydroxide-simeth (MYLANTA) 200-200-20 MG/5ML suspension Take 10 mLs by mouth 2 (two) times daily as needed for indigestion or heartburn.    Yes Historical Provider, MD  atorvastatin (LIPITOR) 20 MG tablet Take 20 mg by mouth daily at 6 PM.    Yes Historical Provider, MD  cholecalciferol (VITAMIN D) 1000 UNITS tablet Take 1,000 Units by mouth daily.   Yes Historical Provider, MD  docusate sodium (COLACE) 100 MG capsule As needed if you go more than 24 hours without bowel movement while taking pain medication 10/10/15  Yes Tanna Furry, MD  magnesium oxide (MAG-OX  400) 400 MG tablet Take 1 tablet (400 mg total) by mouth every 7 (seven) days. 01/13/13  Yes Rogene Houston, MD  oxyCODONE-acetaminophen (PERCOCET/ROXICET) 5-325 MG tablet Take 1 tablet by mouth every 4 (four) hours as needed. 10/10/15  Yes Tanna Furry, MD  pantoprazole (PROTONIX) 40 MG tablet TAKE ONE TABLET EACH DAY BEFORE BREAKFAST 11/20/16  Yes Rogene Houston, MD  senna-docusate (SENOKOT-S) 8.6-50 MG tablet Take 1 tablet by mouth at bedtime as needed for mild constipation.   Yes Historical Provider, MD  warfarin (COUMADIN) 3 MG tablet Take 3 mg by mouth daily.  03/09/11   Yes Historical Provider, MD  methocarbamol (ROBAXIN) 500 MG tablet Take 1 tablet (500 mg total) by mouth 2 (two) times daily. 01/25/17   Merrily Pew, MD    Family History Family History  Problem Relation Age of Onset  . Coronary artery disease Father     Social History Social History  Substance Use Topics  . Smoking status: Never Smoker  . Smokeless tobacco: Never Used  . Alcohol use No     Allergies   Sulfonamide derivatives   Review of Systems Review of Systems  Constitutional: Negative for fever.  Respiratory: Negative for shortness of breath.   Cardiovascular: Negative for chest pain.  Gastrointestinal: Negative for abdominal pain.  Genitourinary: Positive for enuresis. Negative for difficulty urinating and frequency.  Musculoskeletal: Positive for back pain.  Neurological: Negative for numbness.  All other systems reviewed and are negative.    Physical Exam Updated Vital Signs BP (!) 170/86   Pulse 80   Temp 97.9 F (36.6 C) (Oral)   Resp 18   Ht 5\' 9"  (1.753 m)   Wt 160 lb (72.6 kg)   SpO2 99%   BMI 23.63 kg/m   Physical Exam  Constitutional: He is oriented to person, place, and time. He appears well-developed and well-nourished.  HENT:  Head: Normocephalic and atraumatic.  Eyes: EOM are normal.  Neck: Normal range of motion.  Cardiovascular: Normal rate, regular rhythm, normal heart sounds and intact distal pulses.   Pulmonary/Chest: Effort normal and breath sounds normal. No respiratory distress.  Abdominal: Soft. He exhibits no distension. There is no tenderness.  Musculoskeletal: Normal range of motion. He exhibits no deformity.  No tenderness in back. No midline tenderness. No step offs or deformities. Normal strength. He is able to move all extremities.  Neurological: He is alert and oriented to person, place, and time.  Skin: Skin is warm and dry.  Psychiatric: He has a normal mood and affect. Judgment normal.  Nursing note and vitals  reviewed.  ED Treatments / Results  DIAGNOSTIC STUDIES: Oxygen Saturation is 98% on RA, normal by my interpretation.    COORDINATION OF CARE: 1:46 PM Discussed treatment plan with pt at bedside and pt agreed to plan. I will check the patient's x-ray.  Labs (all labs ordered are listed, but only abnormal results are displayed) Labs Reviewed - No data to display  EKG  EKG Interpretation None       Radiology Ct Lumbar Spine Wo Contrast  Result Date: 01/25/2017 CLINICAL DATA:  Low back pain and began earlier today EXAM: CT LUMBAR SPINE WITHOUT CONTRAST TECHNIQUE: Multidetector CT imaging of the lumbar spine was performed without intravenous contrast administration. Multiplanar CT image reconstructions were also generated. COMPARISON:  MR lumbar spine 10/10/2015 FINDINGS: Segmentation: 5 lumbar type vertebrae. Alignment: Dextroscoliosis of the lumbar spine. Vertebrae: No aggressive osseous lesion. Mild age indeterminate L1 and L2 vertebral body  compression fractures with less than 10% height loss. Mild L3 vertebral body compression fracture with approximately 15% height loss. Severe L4 vertebral body compression fracture with approximately 90% anterior height loss with progressive height loss compared with 10/10/2015. L5 vertebral body compression fracture with approximately 50% height loss. Paraspinal and other soft tissues: No paraspinal abnormality. Abdominal aortic atherosclerosis. Disc levels: At T12-L1 there is a mild broad-based disc bulge and mild bilateral facet arthropathy. Mild broad-based disc bulge at L1-2 with moderate bilateral facet arthropathy. At L2-3 there is a moderate broad-based disc osteophyte complex with a more focal left paracentral component with mass effect on the thecal sac. Moderate bilateral facet arthropathy. Moderate spinal stenosis. Left lateral recess stenosis. At L3-4 there is a broad-based disc bulge and mild retropulsion of the superior posterior margin of the  L4 vertebral body. Moderate bilateral facet arthropathy. Severe spinal stenosis. At L4-5 there is a broad-based disc bulge. Moderate bilateral facet arthropathy. Severe the spinal stenosis. At L5-S1 there is a broad-based disc osteophyte complex. Bilateral mild facet arthropathy. Moderate versus severe spinal stenosis. IMPRESSION: 1. Mild age indeterminate L1 and L2 vertebral body compression fractures with less than 10% height loss. Mild L3 vertebral body compression fracture with approximately 15% height loss. Severe L4 vertebral body compression fracture with approximately 90% anterior height loss with progressive height loss compared with 10/10/2015. L5 vertebral body compression fracture with approximately 50% height loss. If there is further clinical concern regarding the acuity of the fractures, recommend MRI of the lumbar spine. 2. Diffuse lumbar spine spondylosis. Electronically Signed   By: Kathreen Devoid   On: 01/25/2017 16:55   Ct Pelvis Wo Contrast  Result Date: 01/25/2017 CLINICAL DATA:  Pelvic pain. EXAM: CT PELVIS WITHOUT CONTRAST TECHNIQUE: Multidetector CT imaging of the pelvis was performed following the standard protocol without intravenous contrast. COMPARISON:  IC insert is seen hand hilar become back more today reduction very vertex hit all FINDINGS: Urinary Tract: No ureteral lower bladder calculi are identified. No bladder mass. Bowel: The rectum, sigmoid colon and visualized small bowel loops are unremarkable. No inflammatory changes or mass lesions. Vascular/Lymphatic: Advanced atherosclerotic calcifications involving the distal aorta and iliac arteries. No focal aneurysm. Reproductive: Enlarged prostate gland with surgical clips or brachytherapy seeds. The seminal vesicles are grossly normal. Other: No inguinal mass or lymphadenopathy. Moderate fluid in the lower right inguinal canal. Musculoskeletal: No acute bony findings. No fracture or AVN. Advanced osteoporosis and degenerative  changes in the lower lumbar spine. Suspect fairly significant spinal stenosis at L4-5. IMPRESSION: 1. No acute bony findings.  No pelvic fractures or hip AVN. 2. Osteoporosis. 3. No significant intrapelvic abnormalities. 4. Mild prostate gland enlargement. 5. Moderate fluid in the right inguinal canal. Electronically Signed   By: Marijo Sanes M.D.   On: 01/25/2017 16:59    Procedures Procedures (including critical care time)  Medications Ordered in ED Medications  methocarbamol (ROBAXIN) tablet 500 mg (500 mg Oral Given 01/25/17 1405)  oxyCODONE-acetaminophen (PERCOCET/ROXICET) 5-325 MG per tablet 1 tablet (1 tablet Oral Given 01/25/17 1720)     Initial Impression / Assessment and Plan / ED Course  I have reviewed the triage vital signs and the nursing notes.  Pertinent labs & imaging results that were available during my care of the patient were reviewed by me and considered in my medical decision making (see chart for details).     Acute exacerbation of chronic back pain. CT showed worsening compression fractures as possible causes. Will follow up with NSG.  Pain controlled in ER. Able to ambulate with walker and minimal assistance. Able to stand from bed with some mild pain.   Care management consulted and homehealth orders placed for PT/OT/RN/Nurse tech for further help at home.   Final Clinical Impressions(s) / ED Diagnoses   Final diagnoses:  Acute bilateral low back pain without sciatica  Closed compression fracture of fourth lumbar vertebra, initial encounter ([place])  Closed compression fracture of fifth lumbar vertebra, initial encounter [facility])    New Prescriptions Discharge Medication List as of 01/25/2017  5:57 PM    START taking these medications   Details  methocarbamol (ROBAXIN) 500 MG tablet Take 1 tablet (500 mg total) by mouth 2 (two) times daily., Starting Fri 01/25/2017, Print       I personally performed the services described in this documentation, which was  scribed in my presence. The recorded information has been reviewed and is accurate.    Merrily Pew, MD 01/25/17 2115

## 2017-01-25 NOTE — ED Notes (Signed)
Pt taken to ct 

## 2017-01-30 DIAGNOSIS — K210 Gastro-esophageal reflux disease with esophagitis: Secondary | ICD-10-CM | POA: Diagnosis not present

## 2017-01-30 DIAGNOSIS — Z7901 Long term (current) use of anticoagulants: Secondary | ICD-10-CM | POA: Diagnosis not present

## 2017-01-30 DIAGNOSIS — M545 Low back pain: Secondary | ICD-10-CM | POA: Diagnosis not present

## 2017-01-30 DIAGNOSIS — M4850XD Collapsed vertebra, not elsewhere classified, site unspecified, subsequent encounter for fracture with routine healing: Secondary | ICD-10-CM | POA: Diagnosis not present

## 2017-01-30 DIAGNOSIS — I4891 Unspecified atrial fibrillation: Secondary | ICD-10-CM | POA: Diagnosis not present

## 2017-01-31 ENCOUNTER — Other Ambulatory Visit (HOSPITAL_COMMUNITY): Payer: Self-pay | Admitting: Pulmonary Disease

## 2017-01-31 DIAGNOSIS — M545 Low back pain: Secondary | ICD-10-CM

## 2017-02-07 ENCOUNTER — Ambulatory Visit (HOSPITAL_COMMUNITY)
Admission: RE | Admit: 2017-02-07 | Discharge: 2017-02-07 | Disposition: A | Discharge status: Home / Self Care | Payer: Medicare Other | Source: Ambulatory Visit | Attending: Pulmonary Disease | Admitting: Pulmonary Disease

## 2017-02-07 DIAGNOSIS — M48061 Spinal stenosis, lumbar region without neurogenic claudication: Secondary | ICD-10-CM | POA: Insufficient documentation

## 2017-02-07 DIAGNOSIS — S32039A Unspecified fracture of third lumbar vertebra, initial encounter for closed fracture: Secondary | ICD-10-CM | POA: Diagnosis not present

## 2017-02-07 DIAGNOSIS — S32059A Unspecified fracture of fifth lumbar vertebra, initial encounter for closed fracture: Secondary | ICD-10-CM | POA: Insufficient documentation

## 2017-02-07 DIAGNOSIS — S22089A Unspecified fracture of T11-T12 vertebra, initial encounter for closed fracture: Secondary | ICD-10-CM | POA: Insufficient documentation

## 2017-02-07 DIAGNOSIS — S32049A Unspecified fracture of fourth lumbar vertebra, initial encounter for closed fracture: Secondary | ICD-10-CM | POA: Diagnosis not present

## 2017-02-07 DIAGNOSIS — M545 Low back pain: Secondary | ICD-10-CM | POA: Diagnosis not present

## 2017-02-11 ENCOUNTER — Ambulatory Visit (INDEPENDENT_AMBULATORY_CARE_PROVIDER_SITE_OTHER): Payer: Medicare Other | Admitting: Internal Medicine

## 2017-02-13 ENCOUNTER — Other Ambulatory Visit (HOSPITAL_COMMUNITY): Payer: Self-pay | Admitting: Interventional Radiology

## 2017-02-13 ENCOUNTER — Telehealth (HOSPITAL_COMMUNITY): Payer: Self-pay

## 2017-02-13 DIAGNOSIS — M4850XA Collapsed vertebra, not elsewhere classified, site unspecified, initial encounter for fracture: Principal | ICD-10-CM

## 2017-02-13 DIAGNOSIS — IMO0001 Reserved for inherently not codable concepts without codable children: Secondary | ICD-10-CM

## 2017-02-13 NOTE — Telephone Encounter (Signed)
Called to schedule consult, left message for daughter to return call. AW 

## 2017-02-28 ENCOUNTER — Ambulatory Visit (HOSPITAL_COMMUNITY)
Admission: RE | Admit: 2017-02-28 | Discharge: 2017-02-28 | Disposition: A | Discharge status: Home / Self Care | Payer: Medicare Other | Source: Ambulatory Visit | Attending: Interventional Radiology | Admitting: Interventional Radiology

## 2017-02-28 DIAGNOSIS — IMO0001 Reserved for inherently not codable concepts without codable children: Secondary | ICD-10-CM

## 2017-02-28 DIAGNOSIS — M545 Low back pain: Secondary | ICD-10-CM | POA: Diagnosis not present

## 2017-02-28 DIAGNOSIS — M4856XA Collapsed vertebra, not elsewhere classified, lumbar region, initial encounter for fracture: Secondary | ICD-10-CM | POA: Diagnosis not present

## 2017-02-28 DIAGNOSIS — M4850XA Collapsed vertebra, not elsewhere classified, site unspecified, initial encounter for fracture: Principal | ICD-10-CM

## 2017-02-28 HISTORY — PX: IR RADIOLOGIST EVAL & MGMT: IMG5224

## 2017-03-01 ENCOUNTER — Other Ambulatory Visit (HOSPITAL_COMMUNITY): Payer: Self-pay | Admitting: Interventional Radiology

## 2017-03-01 DIAGNOSIS — M4856XA Collapsed vertebra, not elsewhere classified, lumbar region, initial encounter for fracture: Secondary | ICD-10-CM

## 2017-03-04 ENCOUNTER — Encounter (HOSPITAL_COMMUNITY): Payer: Self-pay | Admitting: Interventional Radiology

## 2017-03-04 ENCOUNTER — Other Ambulatory Visit (INDEPENDENT_AMBULATORY_CARE_PROVIDER_SITE_OTHER): Payer: Self-pay | Admitting: Internal Medicine

## 2017-03-04 ENCOUNTER — Other Ambulatory Visit: Payer: Self-pay

## 2017-03-04 NOTE — Patient Outreach (Signed)
[facility]) Care Management  03/04/2017  LADARRIUS BOGDANSKI 1927-12-25 [id number]    Medication Adherence call to Mr. Donavyn Fecher  Patient is only taking 1/2 of tablet (Atorvastatin 40 mg) instead of 1 tablet that's why he is running late call doctors office and left a message with nurse to send a prescription stating that he is only taking 1/2 of the tablet not 1 tablet at this time 03/04/17  [facility] Management Direct Dial [phone]  Fax [phone] [email or web address]

## 2017-03-05 ENCOUNTER — Other Ambulatory Visit: Payer: Self-pay | Admitting: Radiology

## 2017-03-06 ENCOUNTER — Other Ambulatory Visit: Payer: Self-pay | Admitting: Physician Assistant

## 2017-03-07 ENCOUNTER — Encounter (HOSPITAL_COMMUNITY): Payer: Self-pay

## 2017-03-07 ENCOUNTER — Ambulatory Visit (HOSPITAL_COMMUNITY)
Admission: RE | Admit: 2017-03-07 | Discharge: 2017-03-07 | Disposition: A | Discharge status: Home / Self Care | Payer: Medicare Other | Source: Ambulatory Visit | Attending: Interventional Radiology | Admitting: Interventional Radiology

## 2017-03-07 DIAGNOSIS — I2510 Atherosclerotic heart disease of native coronary artery without angina pectoris: Secondary | ICD-10-CM | POA: Diagnosis not present

## 2017-03-07 DIAGNOSIS — M4856XA Collapsed vertebra, not elsewhere classified, lumbar region, initial encounter for fracture: Secondary | ICD-10-CM | POA: Diagnosis not present

## 2017-03-07 DIAGNOSIS — I4891 Unspecified atrial fibrillation: Secondary | ICD-10-CM | POA: Diagnosis not present

## 2017-03-07 DIAGNOSIS — K219 Gastro-esophageal reflux disease without esophagitis: Secondary | ICD-10-CM | POA: Diagnosis not present

## 2017-03-07 DIAGNOSIS — E782 Mixed hyperlipidemia: Secondary | ICD-10-CM | POA: Insufficient documentation

## 2017-03-07 DIAGNOSIS — Z955 Presence of coronary angioplasty implant and graft: Secondary | ICD-10-CM | POA: Insufficient documentation

## 2017-03-07 DIAGNOSIS — Z882 Allergy status to sulfonamides status: Secondary | ICD-10-CM | POA: Insufficient documentation

## 2017-03-07 DIAGNOSIS — Z951 Presence of aortocoronary bypass graft: Secondary | ICD-10-CM | POA: Diagnosis not present

## 2017-03-07 DIAGNOSIS — M545 Low back pain: Secondary | ICD-10-CM | POA: Diagnosis not present

## 2017-03-07 DIAGNOSIS — Z7901 Long term (current) use of anticoagulants: Secondary | ICD-10-CM | POA: Insufficient documentation

## 2017-03-07 HISTORY — PX: IR KYPHO LUMBAR INC FX REDUCE BONE BX UNI/BIL CANNULATION INC/IMAGING: IMG5519

## 2017-03-07 LAB — POCT I-STAT, CHEM 8
BUN: 15 mg/dL (ref 6–20)
CALCIUM ION: 1.31 mmol/L (ref 1.15–1.40)
CREATININE: 1.1 mg/dL (ref 0.61–1.24)
Chloride: 104 mmol/L (ref 101–111)
Glucose, Bld: 107 mg/dL — ABNORMAL HIGH (ref 65–99)
HCT: 33 % — ABNORMAL LOW (ref 39.0–52.0)
Hemoglobin: 11.2 g/dL — ABNORMAL LOW (ref 13.0–17.0)
Potassium: 4.5 mmol/L (ref 3.5–5.1)
Sodium: 143 mmol/L (ref 135–145)
TCO2: 26 mmol/L (ref 0–100)

## 2017-03-07 LAB — CBC
HCT: 35.4 % — ABNORMAL LOW (ref 39.0–52.0)
Hemoglobin: 11.1 g/dL — ABNORMAL LOW (ref 13.0–17.0)
MCH: 28.8 pg (ref 26.0–34.0)
MCHC: 31.4 g/dL (ref 30.0–36.0)
MCV: 91.7 fL (ref 78.0–100.0)
PLATELETS: 163 10*3/uL (ref 150–400)
RBC: 3.86 MIL/uL — ABNORMAL LOW (ref 4.22–5.81)
RDW: 15.5 % (ref 11.5–15.5)
WBC: 8.4 10*3/uL (ref 4.0–10.5)

## 2017-03-07 LAB — BASIC METABOLIC PANEL
Anion gap: 9 (ref 5–15)
BUN: 14 mg/dL (ref 6–20)
CALCIUM: 9.7 mg/dL (ref 8.9–10.3)
CHLORIDE: 107 mmol/L (ref 101–111)
CO2: 26 mmol/L (ref 22–32)
CREATININE: 1.16 mg/dL (ref 0.61–1.24)
GFR calc Af Amer: >60 mL/min (ref >60)
GFR calc non Af Amer: 55 mL/min — ABNORMAL LOW (ref >60)
Glucose, Bld: 112 mg/dL — ABNORMAL HIGH (ref 65–99)
Potassium: 4.3 mmol/L (ref 3.5–5.1)
SODIUM: 142 mmol/L (ref 135–145)

## 2017-03-07 LAB — PROTIME-INR
INR: 1.13
Prothrombin Time: 14.5 seconds (ref 11.4–15.2)

## 2017-03-07 LAB — APTT: aPTT: 31 seconds (ref 24–36)

## 2017-03-07 MED ORDER — TOBRAMYCIN SULFATE 1.2 G IJ SOLR
INTRAMUSCULAR | Status: AC | PRN
  Administered 2017-03-07: 1.2 g via TOPICAL

## 2017-03-07 MED ORDER — BUPIVACAINE HCL (PF) 0.25 % IJ SOLN
INTRAMUSCULAR | Status: AC | PRN
  Administered 2017-03-07: 20 mL

## 2017-03-07 MED ORDER — MIDAZOLAM HCL 2 MG/2ML IJ SOLN
INTRAMUSCULAR | Status: AC
  Filled 2017-03-07: qty 2

## 2017-03-07 MED ORDER — CEFAZOLIN SODIUM-DEXTROSE 2-4 GM/100ML-% IV SOLN
INTRAVENOUS | Status: AC
  Filled 2017-03-07: qty 100

## 2017-03-07 MED ORDER — SODIUM CHLORIDE 0.9 % IV SOLN
INTRAVENOUS | Status: DC
  Administered 2017-03-07: 11:00:00 via INTRAVENOUS

## 2017-03-07 MED ORDER — IOPAMIDOL (ISOVUE-300) INJECTION 61%
INTRAVENOUS | Status: AC
  Administered 2017-03-07: 10 mL
  Filled 2017-03-07: qty 50

## 2017-03-07 MED ORDER — FENTANYL CITRATE (PF) 100 MCG/2ML IJ SOLN
INTRAMUSCULAR | Status: AC
  Filled 2017-03-07: qty 2

## 2017-03-07 MED ORDER — FENTANYL CITRATE (PF) 100 MCG/2ML IJ SOLN
INTRAMUSCULAR | Status: AC | PRN
  Administered 2017-03-07 (×3): 25 ug via INTRAVENOUS

## 2017-03-07 MED ORDER — TOBRAMYCIN SULFATE 1.2 G IJ SOLR
INTRAMUSCULAR | Status: AC
  Filled 2017-03-07: qty 1.2

## 2017-03-07 MED ORDER — CEFAZOLIN SODIUM-DEXTROSE 2-4 GM/100ML-% IV SOLN
2.0000 g | INTRAVENOUS | Status: AC
  Administered 2017-03-07: 2 g via INTRAVENOUS

## 2017-03-07 MED ORDER — SODIUM CHLORIDE 0.9 % IV SOLN: INTRAVENOUS | Status: AC

## 2017-03-07 MED ORDER — HYDROMORPHONE HCL 1 MG/ML IJ SOLN
INTRAMUSCULAR | Status: AC | PRN
  Administered 2017-03-07: 1 mg via INTRAVENOUS

## 2017-03-07 MED ORDER — MIDAZOLAM HCL 2 MG/2ML IJ SOLN
INTRAMUSCULAR | Status: AC | PRN
  Administered 2017-03-07: 0.5 mg via INTRAVENOUS
  Administered 2017-03-07: 1 mg via INTRAVENOUS
  Administered 2017-03-07: 0.5 mg via INTRAVENOUS

## 2017-03-07 MED ORDER — BUPIVACAINE HCL (PF) 0.25 % IJ SOLN
INTRAMUSCULAR | Status: AC
  Filled 2017-03-07: qty 30

## 2017-03-07 MED ORDER — HYDROMORPHONE HCL 1 MG/ML IJ SOLN
INTRAMUSCULAR | Status: AC
  Filled 2017-03-07: qty 1

## 2017-03-07 NOTE — Procedures (Signed)
S/P L1 balloon KP 

## 2017-03-07 NOTE — Discharge Instructions (Signed)
1.No stooping,bending or lifting more than 10 lbs for 2 weeks. 2.Use Brayam Boeke to ambulate for 2 weeks. No driving for 2 weeks. 3.RTC in 2 weeks    KYPHOPLASTY/VERTEBROPLASTY DISCHARGE INSTRUCTIONS  Medications: (check all that apply)     Resume all home medications as before procedure.       Resume your (aspirin/Plavix/Coumadin) on: Call coumadin clinic to ask about dose of coumadin.                Continue your pain medications as prescribed as needed.  Over the next 3-5 days, decrease your pain medication as tolerated.  Over the counter medications (i.e. Tylenol, ibuprofen, and aleve) may be substituted once severe/moderate pain symptoms have subsided.   Wound Care: - Bandages may be removed the day following your procedure.  You may get your incision wet once bandages are removed.  Bandaids may be used to cover the incisions until scab formation.  Topical ointments are optional.  - If you develop a fever greater than 101 degrees, have increased skin redness at the incision sites or pus-like oozing from incisions occurring within 1 week of the procedure, contact radiology at [phone] or [phone].  - Ice pack to back for 15-20 minutes 2-3 time per day for first 2-3 days post procedure.  The ice will expedite muscle healing and help with the pain from the incisions.   Activity: - Bedrest today with limited activity for 24 hours post procedure.  - No driving for 48 hours.  - Increase your activity as tolerated after bedrest (with assistance if necessary).  - Refrain from any strenuous activity or heavy lifting (greater than 10 lbs.).   Follow up: - Contact radiology at [phone] or [phone] if any questions/concerns.  - A physician assistant from radiology will contact you in approximately 1 week.  - If a biopsy was performed at the time of your procedure, your referring physician should receive the results in usually 2-3 days.

## 2017-03-07 NOTE — H&P (Signed)
Chief Complaint: Patient was seen in consultation today for L1 compression fracture at the request of [place]  Referring Physician(s): Deveshwar,Sanjeev  Supervising Physician: Luanne Bras  Patient Status: [facility] - Out-pt  History of Present Illness: Sergio Boyle is a 83 y.o. male who was worked up for symptomatic L1 compression fracture. He was seen by Dr. Estanislado Pandy a few days ago and is now scheduled for KP/VP of this L1 Fx. PMHx, meds, labs, allergies reviewed. Pt has been off his Coumadin for about a month. Feels well otherwise. No recent fevers, chills, infectious issues. Wife is at bedside Has been NPO this am  Past Medical History:  Diagnosis Date  . Atrial fibrillation ([place])   . Cancer ([place])   . Coronary atherosclerosis of native coronary artery    Prior stenting of LAD and diagonal at [facility] with subsequent SVG to diagonal, LVEF 55-60%  . Dysphagia   . GERD   . Hiccups   . History of prostate cancer   . Mixed hyperlipidemia   . Weight loss     Past Surgical History:  Procedure Laterality Date  . Bilateral inguinal hernia repair    . bulging disc    . COLONOSCOPY  06/2010  . CORONARY ARTERY BYPASS GRAFT  1996    Emergent SVG to diagonal at [facility]  . ESOPHAGEAL DILATION N/A 06/10/2015   Procedure: ESOPHAGEAL DILATION;  Surgeon: Rogene Houston, MD;  Location: [facility];  Service: Endoscopy;  Laterality: N/A;  . ESOPHAGOGASTRODUODENOSCOPY     w/esophageal dilation   . ESOPHAGOGASTRODUODENOSCOPY N/A 06/10/2015   Procedure: ESOPHAGOGASTRODUODENOSCOPY (EGD);  Surgeon: Rogene Houston, MD;  Location: [facility];  Service: Endoscopy;  Laterality: N/A;  245  . IR RADIOLOGIST EVAL & MGMT  02/28/2017  . UPPER GASTROINTESTINAL ENDOSCOPY  06/2010    Allergies: Sulfonamide derivatives  Medications: Prior to Admission medications   Medication Sig Start Date End Date Taking? Authorizing Provider  atorvastatin (LIPITOR) 20 MG tablet Take 20 mg by  mouth every morning.    Yes [provider]  cholecalciferol (VITAMIN D) 1000 UNITS tablet Take 1,000 Units by mouth daily.   Yes [provider]  cyanocobalamin 500 MCG tablet Take 500 mcg by mouth daily.   Yes [provider]  docusate sodium (COLACE) 100 MG capsule As needed if you go more than 24 hours without bowel movement while taking pain medication 10/10/15  Yes Tanna Furry, MD  guaiFENesin (MUCINEX) 600 MG 12 hr tablet Take 300 mg by mouth 2 (two) times daily as needed for to loosen phlegm.   Yes [provider]  magnesium oxide (MAG-OX 400) 400 MG tablet Take 1 tablet (400 mg total) by mouth every 7 (seven) days. 01/13/13  Yes Rehman, Mechele Dawley, MD  oxyCODONE-acetaminophen (PERCOCET/ROXICET) 5-325 MG tablet Take 1 tablet by mouth every 4 (four) hours as needed. 10/10/15  Yes Tanna Furry, MD  pantoprazole (PROTONIX) 40 MG tablet TAKE ONE TABLET EACH DAY BEFORE BREAKFAST 11/20/16  Yes Rehman, Mechele Dawley, MD  Propylene Glycol (SYSTANE BALANCE OP) Place 1-2 drops into both eyes daily as needed.   Yes [provider]  ALPRAZolam Duanne Moron) 0.5 MG tablet TAKE ONE TABLET TWICE DAILY 03/04/17   Rogene Houston, MD  warfarin (COUMADIN) 3 MG tablet Take 3 mg by mouth daily.  03/09/11   [provider]     Family History  Problem Relation Age of Onset  . Coronary artery disease Father     Social  History   Social History  . Marital status: Married    Spouse name: N/A  . Number of children: N/A  . Years of education: N/A   Social History Main Topics  . Smoking status: Never Smoker  . Smokeless tobacco: Never Used  . Alcohol use No  . Drug use: No  . Sexual activity: Not Asked   Other Topics Concern  . None   Social History Narrative   [name]. Has 1 child from 1st marriage and 2 step children from second.     Review of Systems: A 12 point ROS discussed and pertinent positives are indicated in the HPI above.  All other  systems are negative.  Review of Systems  Vital Signs: BP 130/76   Pulse 86   Temp 97.5 F (36.4 C)   Resp 18   Ht 5\' 9"  (1.753 m)   Wt 160 lb (72.6 kg)   SpO2 99%   BMI 23.63 kg/m   Physical Exam  Constitutional: He is oriented to person, place, and time. He appears well-developed. No distress.  HENT:  Head: Normocephalic.  Mouth/Throat: Oropharynx is clear and moist.  Neck: Normal range of motion. No JVD present. No tracheal deviation present.  Cardiovascular:  Irregular rhythm, no murmur or rub.  Pulmonary/Chest: Effort normal and breath sounds normal. No respiratory distress.  Abdominal: Soft. He exhibits no distension and no mass. There is no tenderness.  Neurological: He is alert and oriented to person, place, and time.  Skin: Skin is warm and dry.  Psychiatric: He has a normal mood and affect.     Mallampati Score:  MD Evaluation Airway: WNL Heart: WNL Abdomen: WNL Chest/ Lungs: WNL ASA  Classification: 3 Mallampati/Airway Score: Two  Imaging: Mr Lumbar Spine Wo Contrast  Result Date: 02/08/2017 CLINICAL DATA:  Low back pain.  Lumbar fracture. EXAM: MRI LUMBAR SPINE WITHOUT CONTRAST TECHNIQUE: Multiplanar, multisequence MR imaging of the lumbar spine was performed. No intravenous contrast was administered. COMPARISON:  CT lumbar spine 01/25/2017, lumbar MRI 10/10/2015 FINDINGS: Segmentation: Normal segmentation. S1 is partially lumbarized with a small disc space at S1-2. This is consistent with prior numbering. Alignment: Mild anterolisthesis L3-4. Reversal of the lumbar lordosis with mild to moderate kyphosis. Vertebrae: Multiple lumbar compression fractures. L1 fracture is acute with diffuse bone marrow edema. Superior endplate fracture with approximately 10-15% loss of vertebral body height. No retropulsion of bone into the canal Mild chronic fracture T12 Severe chronic compression fracture L4 Moderate compression fracture L5 which is chronic. Conus medullaris:  Extends to the L2-3 level and appears normal. Paraspinal and other soft tissues: No paraspinous mass or hematoma. No retroperitoneal adenopathy. Disc levels: T12-L1: Disc degeneration with mild spurring. No significant stenosis. L1-2: Disc degeneration with disc bulging and spurring. Mild facet degeneration. No significant stenosis. L2-3: Advanced disc degeneration. Diffuse endplate spurring left greater than right. Large left-sided osteophyte compressing the thecal sac and subarticular zone. Moderately severe spinal stenosis, left greater than right. Neural foramina adequately patent. L3-4: Disc bulging and prominent endplate spurring left greater than right. Some of this is related to fracture. There is severe spinal stenosis. Severe subarticular stenosis bilaterally as well as severe left foraminal encroachment with impingement of the left L3 and L4 nerve roots. Right L4 nerve root impingement. L4-5: Diffuse disc bulging with endplate spurring. Severe spinal stenosis. Bilateral facet degeneration. Mild foraminal narrowing bilaterally. Subarticular stenosis bilaterally L5-S1: Moderate disc degeneration with endplate spurring. Moderate right foraminal encroachment due to spurring. Mild  spinal stenosis. IMPRESSION: 10-15% acute fracture of L1 appears benign Chronic fractures of T12, L4, L5 Advanced multilevel degenerative change. Moderate spinal stenosis L2-3. Severe spinal stenosis at L3-4 and L4-5. Severe subarticular and foraminal stenosis on the left at L3-4. Electronically Signed   By: Franchot Gallo M.D.   On: 02/08/2017 07:41   Ir Radiologist Eval & Mgmt  Result Date: 03/04/2017 EXAM: NEW PATIENT OFFICE VISIT CHIEF COMPLAINT: Severe low back pain secondary to compression fracture at L1. Current Pain Level: HISTORY OF PRESENT ILLNESS: The patient is an 83 year old right-handed gentleman referred for evaluation of pain relief due to painful compression fracture at L1. The patient is accompanied by his son,  daughter and wife. According to them, the patient has had chronic back problems for many years. However, since March there has been an acute exacerbation in pain and also in his ability to ambulate. There is no history of a fall. Since March, the patient has had severe debilitating pain which is a 10 out of 10 when the patient is standing or ambulating with a walker. The patient reports some relief of pain with lying down or sitting down. However, stooping, coughing exacerbates severe pain in the upper lumbar region. There is no circumferential radiation or radiation into the lower extremities or of a radicular manner. The patient denies any autonomic dysfunction of his bowel or bladder activities. The patient denies any recent chills, fever or rigors. This pain is described as being almost constant sharp when exacerbated. His appetite is significantly reduced due to the pain since he was started on Percocet. He has lost some weight since then. The patient reports relief of the pain to a mild degree with Percocet one tablet every 4-6 hourly. This, however, has caused him to become lethargic and also constipated. Otherwise, the patient reports no chest pain, shortness of breath, palpitations, wheezing, abdominal pain, or diarrhea or hematemesis or melena. He denies any symptoms of UTI especially polyuria, hematuria or dysuria. Past Medical History: Arthropathy. Hyperlipidemia. Anxiety. Cervicalgia. Dysphagia. Gastroesophageal reflux with esophagitis. Headaches. Irritable bowel syndrome. Low back pain. Obstructive and reflux uropathy. Atrial fibrillation. Cancer of the prostate. Surgical History:  Hernia repair.  Coronary artery bypass graft. Medications: Xanax as needed. Mylanta. Atorvastatin. Cholecalciferol vitamin D. Colace. Magnesium oxide. Oxycodone Percocet 5/325 every 4 hours as needed. Protonix. Warfarin which was stopped a month ago. Allergies: Sulfonamide derivatives. Social History: Married. Has 3 children  alive and well. Patient denies drinking alcohol or smoking. Denies using illicit chemicals. Family History: Mother deceased age 70s kidney failure. Father deceased age 14 multiple health problems. REVIEW OF SYSTEMS: Negative unless as mentioned above. PHYSICAL EXAMINATION: In moderate distress on account of the severe pain in his low back. Affect appropriate to the clinical situation. Neurologically no lateralizing cranial nerve, motor sensory or coordination difficulties. ASSESSMENT AND PLAN: The patient's recent MRI scan of the lumbosacral spine was reviewed with the patient and his family. The L1 vertebral body signal abnormalities with compression deformity was brought to their attention. Also brought to their attention was the multilevel degenerative disc disease with degenerate spondylosis and facet arthropathy. Focal areas of severe spinal canal stenosis at L3-L4 and L4-5 were also brought to their attention. The option of vertebral body augmentation for pain relief to prevent further collapse and to obviate the need for Percocet was reviewed with the patient and the patient's family. The procedure, the benefits, the alternatives were all reviewed in detail. Questions were answered to their satisfaction. Given patient's history  of prostate cancer, a biopsy at the time of the vertebral body augmentation may be considered. This will be discussed with with Dr. Sinda Du, the patient's referring doctor. The patient and the family leave with good understanding and agreement with the above management plan. The augmentation procedure will be scheduled as soon as possible. Electronically Signed   By: Luanne Bras M.D.   On: 03/03/2017 20:10    Labs:  CBC: No results for input(s): WBC, HGB, HCT, PLT in the last 8760 hours.  COAGS: No results for input(s): INR, APTT in the last 8760 hours.  BMP: No results for input(s): NA, K, CL, CO2, GLUCOSE, BUN, CALCIUM, CREATININE, GFRNONAA, GFRAA in the  last 8760 hours.  Invalid input(s): CMP   Assessment and Plan: Symptomatic L1 compression fracture For KP/VP today Labs pending Risks and Benefits discussed with the patient including, but not limited to education regarding the natural healing process of compression fractures without intervention, bleeding, infection, cement migration which may cause spinal cord damage, paralysis, pulmonary embolism or even death. All of the patient's questions were answered, patient is agreeable to proceed. Consent signed and in chart.     Thank you for this interesting consult.  I greatly enjoyed meeting YAACOV KOZIOL and look forward to participating in their care.  A copy of this report was sent to the requesting provider on this date.  Electronically Signed: Ascencion Dike 03/07/2017, 11:12 AM   I spent a total of 15 minutes in face to face in clinical consultation, greater than 50% of which was counseling/coordinating care for L1 KP

## 2017-03-08 ENCOUNTER — Encounter (HOSPITAL_COMMUNITY): Payer: Self-pay | Admitting: Interventional Radiology

## 2017-03-15 ENCOUNTER — Telehealth (HOSPITAL_COMMUNITY): Payer: Self-pay

## 2017-03-15 NOTE — Telephone Encounter (Signed)
Called to schedule f/u, left message for daughter to return call. AW 

## 2017-04-02 ENCOUNTER — Encounter (INDEPENDENT_AMBULATORY_CARE_PROVIDER_SITE_OTHER): Payer: Self-pay | Admitting: Internal Medicine

## 2017-04-02 ENCOUNTER — Ambulatory Visit (INDEPENDENT_AMBULATORY_CARE_PROVIDER_SITE_OTHER): Payer: Medicare Other | Admitting: Internal Medicine

## 2017-04-02 ENCOUNTER — Encounter (INDEPENDENT_AMBULATORY_CARE_PROVIDER_SITE_OTHER): Payer: Self-pay

## 2017-04-02 VITALS — BP 130/70 | HR 64 | Temp 97.0°F | Ht 69.0 in | Wt 160.0 lb

## 2017-04-02 DIAGNOSIS — R1310 Dysphagia, unspecified: Secondary | ICD-10-CM | POA: Diagnosis not present

## 2017-04-02 DIAGNOSIS — R1319 Other dysphagia: Secondary | ICD-10-CM

## 2017-04-02 NOTE — Patient Instructions (Signed)
Will schedule manometry at [facility].

## 2017-04-02 NOTE — Progress Notes (Signed)
Subjective:    Patient ID: Sergio Boyle, male    DOB: Sep 02, 1928, 83 y.o.   MRN: [id number]  HPI Here today for f/u. Last seen in March with c/o dysphagia. Hx of same for years. Patient states he has continued dysphagia.  Last OV 163. Today his weight is 160. Family is very concerned. Family wants to know what next step. Patient has a lot of mucous.  He is on a regular diet.  BMs are normal. Recently had back surgery 03/07/2017 (L1 cement).  Takes Mucinex 3 times a day which is helping. 06/14/2015 EGD/ED:   Impression: No evidence of erosive esophagitis ring or stricture. Focal coating of proximal esophageal mucosa possibly due to mild Candida but could be food debris. 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 11/10/2014 DG Esophagram: IMPRESSION: Severe dysmotility with full column stasis into the cervical esophagus and severe mid to distal esophageal spasm.    Review of Systems Past Medical History:  Diagnosis Date  . Atrial fibrillation ([place])   . Cancer ([place])   . Coronary atherosclerosis of native coronary artery    Prior stenting of LAD and diagonal at [facility] with subsequent SVG to diagonal, LVEF 55-60%  . Dysphagia   . GERD   . Hiccups   . History of prostate cancer   . Mixed hyperlipidemia   . Weight loss     Past Surgical History:  Procedure Laterality Date  . Bilateral inguinal hernia repair    . bulging disc    . COLONOSCOPY  06/2010  . CORONARY ARTERY BYPASS GRAFT  1996    Emergent SVG to diagonal at [facility]  . ESOPHAGEAL DILATION N/A 06/10/2015   Procedure: ESOPHAGEAL DILATION;  Surgeon: Rogene Houston, MD;  [facility];  Service: Endoscopy;  Laterality: N/A;  . ESOPHAGOGASTRODUODENOSCOPY     w/esophageal dilation   . ESOPHAGOGASTRODUODENOSCOPY N/A 06/10/2015   Procedure: ESOPHAGOGASTRODUODENOSCOPY (EGD);   Surgeon: Rogene Houston, MD;  Location: [facility];  Service: Endoscopy;  Laterality: N/A;  245  . IR KYPHO LUMBAR INC FX REDUCE BONE BX UNI/BIL CANNULATION INC/IMAGING  03/07/2017  . IR RADIOLOGIST EVAL & MGMT  02/28/2017  . UPPER GASTROINTESTINAL ENDOSCOPY  06/2010    Allergies  Allergen Reactions  . Sulfonamide Derivatives     Patient says his wife says he's allergic to sulfa.    Current Outpatient Prescriptions on File Prior to Visit  Medication Sig Dispense Refill  . ALPRAZolam (XANAX) 0.5 MG tablet TAKE ONE TABLET TWICE DAILY 60 tablet 2  . atorvastatin (LIPITOR) 20 MG tablet Take 20 mg by mouth every morning.     . cholecalciferol (VITAMIN D) 1000 UNITS tablet Take 1,000 Units by mouth daily.    . cyanocobalamin 500 MCG tablet Take 500 mcg by mouth daily.    Marland Kitchen docusate sodium (COLACE) 100 MG capsule As needed if you go more than 24 hours without bowel movement while taking pain medication 30 capsule 0  . guaiFENesin (MUCINEX) 600 MG 12 hr tablet Take 300 mg by mouth 3 (three) times daily.     . magnesium oxide (MAG-OX 400) 400 MG tablet Take 1 tablet (400 mg total) by mouth every 7 (seven) days.    Marland Kitchen oxyCODONE-acetaminophen (PERCOCET/ROXICET) 5-325 MG tablet Take 1 tablet by mouth every 4 (four) hours as needed. 20 tablet 0  . pantoprazole (PROTONIX) 40 MG tablet TAKE  ONE TABLET EACH DAY BEFORE BREAKFAST 30 tablet 5  . Propylene Glycol (SYSTANE BALANCE OP) Place 1-2 drops into both eyes daily as needed.    . warfarin (COUMADIN) 3 MG tablet Take 3 mg by mouth daily.      No current facility-administered medications on file prior to visit.         Objective:   Physical Exam Blood pressure 130/70, pulse 64, temperature 97 F (36.1 C), height 5\' 9"  (1.753 m), weight 160 lb (72.6 kg).  Alert and oriented. Skin warm and dry. Oral mucosa is moist.   . Sclera anicteric, conjunctivae is pink. Thyroid not enlarged. No cervical lymphadenopathy. Lungs clear. Heart regular rate and  rhythm.  Abdomen is soft. Bowel sounds are positive. No hepatomegaly. No abdominal masses felt. No tenderness.  No edema to lower extremities.  Examined from w/c. Dr. Laural Golden in room with patient for discussion of tx options.        Assessment & Plan:  Dysphagia. Dr. Laural Golden in with patient.  High resolution manometry at [facility]. Lelon Frohlich is aware.

## 2017-04-15 DIAGNOSIS — R1310 Dysphagia, unspecified: Secondary | ICD-10-CM | POA: Diagnosis not present

## 2017-04-15 DIAGNOSIS — R05 Cough: Secondary | ICD-10-CM | POA: Diagnosis not present

## 2017-04-27 ENCOUNTER — Other Ambulatory Visit (INDEPENDENT_AMBULATORY_CARE_PROVIDER_SITE_OTHER): Payer: Self-pay | Admitting: Internal Medicine

## 2017-05-14 DIAGNOSIS — M545 Low back pain: Secondary | ICD-10-CM | POA: Diagnosis not present

## 2017-05-14 DIAGNOSIS — Z7901 Long term (current) use of anticoagulants: Secondary | ICD-10-CM | POA: Diagnosis not present

## 2017-05-14 DIAGNOSIS — K210 Gastro-esophageal reflux disease with esophagitis: Secondary | ICD-10-CM | POA: Diagnosis not present

## 2017-05-14 DIAGNOSIS — I4891 Unspecified atrial fibrillation: Secondary | ICD-10-CM | POA: Diagnosis not present

## 2017-05-27 ENCOUNTER — Ambulatory Visit (INDEPENDENT_AMBULATORY_CARE_PROVIDER_SITE_OTHER): Payer: Medicare Other | Admitting: Internal Medicine

## 2017-05-29 ENCOUNTER — Ambulatory Visit (INDEPENDENT_AMBULATORY_CARE_PROVIDER_SITE_OTHER): Payer: Medicare Other | Admitting: Internal Medicine

## 2017-06-11 ENCOUNTER — Encounter (INDEPENDENT_AMBULATORY_CARE_PROVIDER_SITE_OTHER): Payer: Self-pay | Admitting: Internal Medicine

## 2017-06-11 ENCOUNTER — Ambulatory Visit (INDEPENDENT_AMBULATORY_CARE_PROVIDER_SITE_OTHER): Payer: Medicare Other | Admitting: Internal Medicine

## 2017-06-11 ENCOUNTER — Encounter (INDEPENDENT_AMBULATORY_CARE_PROVIDER_SITE_OTHER): Payer: Self-pay

## 2017-06-11 VITALS — BP 112/70 | HR 73 | Temp 97.0°F | Resp 18 | Ht 68.0 in | Wt 156.0 lb

## 2017-06-11 DIAGNOSIS — K224 Dyskinesia of esophagus: Secondary | ICD-10-CM

## 2017-06-11 DIAGNOSIS — R1310 Dysphagia, unspecified: Secondary | ICD-10-CM

## 2017-06-11 DIAGNOSIS — Z7901 Long term (current) use of anticoagulants: Secondary | ICD-10-CM | POA: Diagnosis not present

## 2017-06-11 DIAGNOSIS — R1319 Other dysphagia: Secondary | ICD-10-CM

## 2017-06-11 NOTE — Progress Notes (Signed)
Presenting complaint;  Follow-up for dysphagia.  Database and Subjective:  Sergio Boyle is 83 year old Caucasian male who is here for scheduled visit accompanied by her son Sergio Boyle. He has several year history of dysphagia. He first underwent EGD with dilation 14 years ago. Last dilation was 2 years ago but he did not experience any relief of dysphagia. He is felt to have esophageal motility disorder. His wife and daughter wanted to find of if anything would be done to improve his swallowing function. He therefore underwent high-resolution esophageal manometry at [facility] on 04/19/2017. Sulci were reviewed with his wife over the phone but he wanted a common talk face-to-face. He has daily dysphagia. He has good and bad days. He seemed to do well with soft foods and liquids. He feels heartburn is well controlled with PPI. He has lost 4 pounds since his last visit. He wants to know if anything could be done to improve his swallowing function. He uses walker to ambulate at home.    Current Medications: Outpatient Encounter Prescriptions as of 06/11/2017  Medication Sig  . ALPRAZolam (XANAX) 0.5 MG tablet TAKE ONE TABLET TWICE DAILY  . atorvastatin (LIPITOR) 20 MG tablet Take 20 mg by mouth at bedtime.   . cholecalciferol (VITAMIN D) 1000 UNITS tablet Take 1,000 Units by mouth daily.  . cyanocobalamin 500 MCG tablet Take 500 mcg by mouth daily.  Marland Kitchen docusate sodium (COLACE) 100 MG capsule As needed if you go more than 24 hours without bowel movement while taking pain medication  . guaiFENesin (MUCINEX) 600 MG 12 hr tablet Take 300 mg by mouth 3 (three) times daily.   . magnesium oxide (MAG-OX 400) 400 MG tablet Take 1 tablet (400 mg total) by mouth every 7 (seven) days.  Marland Kitchen oxyCODONE-acetaminophen (PERCOCET/ROXICET) 5-325 MG tablet Take 1 tablet by mouth every 4 (four) hours as needed.  . pantoprazole (PROTONIX) 40 MG tablet TAKE ONE (1) TABLET EACH DAY BEFORE BREAKFAST  . Propylene Glycol (SYSTANE BALANCE OP)  Place 1-2 drops into both eyes daily as needed.  . warfarin (COUMADIN) 3 MG tablet Take 3 mg by mouth daily.    No facility-administered encounter medications on file as of 06/11/2017.      Objective: Blood pressure 112/70, pulse 73, temperature (!) 97 F (36.1 C), temperature source Oral, resp. rate 18, height 5\' 8"  (1.727 m), weight 156 lb (70.8 kg). Patient is alert and in no acute distress. He is sitting in a wheelchair. Conjunctiva is pink. Sclera is nonicteric Oropharyngeal mucosa is normal. He has dentures. No neck masses or thyromegaly noted. Cardiac exam with iiregular rhythm normal S1 and S2. No murmur or gallop noted. Lungs are clear to auscultation. Abdomen soft and nontender. Examination was limited as it was performed in sitting position. No LE edema or clubbing noted.  Labs/studies Results:  High-resolution esophageal manometry on 04/19/2017 revealed Hypotensive lower esophageal sphincter and failed contracts and's of esophageal body. 90% of swallows failed and 10% of swallows resulted in premature contracts and and fragmented peristalsis. LES pressure was 9.3 and residual 7 mmHg.  Assessment:  #1. Dysphagia secondary to esophageal motility disorder which appears to be primarily hypomotility. I'm not sure there is any therapy that would help. He has to stick with soft foods eats slowly and should be upright at mealtime. I have offered to refer him to [facility] but he is not interested at the present time.    Plan:  Patient will continue with soft foods eats slowly and should be  in upright position at mealtime. He will call if dysphagia worsens or if he is interested in referral to [facility]. Office visit on as-needed basis.

## 2017-06-11 NOTE — Patient Instructions (Signed)
These call if symptoms change or swallowing difficulty gets much worse.

## 2017-06-18 ENCOUNTER — Other Ambulatory Visit (INDEPENDENT_AMBULATORY_CARE_PROVIDER_SITE_OTHER): Payer: Self-pay | Admitting: Internal Medicine

## 2017-06-25 DIAGNOSIS — H353131 Nonexudative age-related macular degeneration, bilateral, early dry stage: Secondary | ICD-10-CM | POA: Diagnosis not present

## 2017-06-25 DIAGNOSIS — H5213 Myopia, bilateral: Secondary | ICD-10-CM | POA: Diagnosis not present

## 2017-06-25 DIAGNOSIS — H524 Presbyopia: Secondary | ICD-10-CM | POA: Diagnosis not present

## 2017-06-25 DIAGNOSIS — H52203 Unspecified astigmatism, bilateral: Secondary | ICD-10-CM | POA: Diagnosis not present

## 2017-06-25 DIAGNOSIS — Z961 Presence of intraocular lens: Secondary | ICD-10-CM | POA: Diagnosis not present

## 2017-07-09 DIAGNOSIS — Z7901 Long term (current) use of anticoagulants: Secondary | ICD-10-CM | POA: Diagnosis not present

## 2017-07-18 DIAGNOSIS — Z7901 Long term (current) use of anticoagulants: Secondary | ICD-10-CM | POA: Diagnosis not present

## 2017-08-06 DIAGNOSIS — Z7901 Long term (current) use of anticoagulants: Secondary | ICD-10-CM | POA: Diagnosis not present

## 2017-08-06 DIAGNOSIS — Z23 Encounter for immunization: Secondary | ICD-10-CM | POA: Diagnosis not present

## 2017-08-27 DIAGNOSIS — M545 Low back pain: Secondary | ICD-10-CM | POA: Diagnosis not present

## 2017-08-27 DIAGNOSIS — Z7901 Long term (current) use of anticoagulants: Secondary | ICD-10-CM | POA: Diagnosis not present

## 2017-08-27 DIAGNOSIS — I4891 Unspecified atrial fibrillation: Secondary | ICD-10-CM | POA: Diagnosis not present

## 2017-08-27 DIAGNOSIS — N401 Enlarged prostate with lower urinary tract symptoms: Secondary | ICD-10-CM | POA: Diagnosis not present

## 2017-08-28 DIAGNOSIS — I4891 Unspecified atrial fibrillation: Secondary | ICD-10-CM | POA: Diagnosis not present

## 2017-09-10 DIAGNOSIS — Z7901 Long term (current) use of anticoagulants: Secondary | ICD-10-CM | POA: Diagnosis not present

## 2017-09-11 ENCOUNTER — Other Ambulatory Visit (INDEPENDENT_AMBULATORY_CARE_PROVIDER_SITE_OTHER): Payer: Self-pay | Admitting: Internal Medicine

## 2017-10-11 ENCOUNTER — Other Ambulatory Visit: Payer: Self-pay | Admitting: Cardiology

## 2017-11-09 ENCOUNTER — Other Ambulatory Visit (INDEPENDENT_AMBULATORY_CARE_PROVIDER_SITE_OTHER): Payer: Self-pay | Admitting: Internal Medicine

## 2017-11-19 DIAGNOSIS — H353132 Nonexudative age-related macular degeneration, bilateral, intermediate dry stage: Secondary | ICD-10-CM | POA: Diagnosis not present

## 2017-11-19 DIAGNOSIS — H348322 Tributary (branch) retinal vein occlusion, left eye, stable: Secondary | ICD-10-CM | POA: Diagnosis not present

## 2017-11-19 DIAGNOSIS — H35371 Puckering of macula, right eye: Secondary | ICD-10-CM | POA: Diagnosis not present

## 2017-11-19 DIAGNOSIS — H35042 Retinal micro-aneurysms, unspecified, left eye: Secondary | ICD-10-CM | POA: Diagnosis not present

## 2017-11-19 DIAGNOSIS — H43813 Vitreous degeneration, bilateral: Secondary | ICD-10-CM | POA: Diagnosis not present

## 2017-12-02 ENCOUNTER — Other Ambulatory Visit (INDEPENDENT_AMBULATORY_CARE_PROVIDER_SITE_OTHER): Payer: Self-pay | Admitting: Internal Medicine

## 2017-12-05 DIAGNOSIS — Z7901 Long term (current) use of anticoagulants: Secondary | ICD-10-CM | POA: Diagnosis not present

## 2017-12-13 ENCOUNTER — Other Ambulatory Visit: Payer: Self-pay | Admitting: Cardiology

## 2017-12-18 DIAGNOSIS — H35371 Puckering of macula, right eye: Secondary | ICD-10-CM | POA: Diagnosis not present

## 2017-12-26 DIAGNOSIS — H35371 Puckering of macula, right eye: Secondary | ICD-10-CM | POA: Diagnosis not present

## 2017-12-26 DIAGNOSIS — H353132 Nonexudative age-related macular degeneration, bilateral, intermediate dry stage: Secondary | ICD-10-CM | POA: Diagnosis not present

## 2018-01-03 ENCOUNTER — Other Ambulatory Visit (INDEPENDENT_AMBULATORY_CARE_PROVIDER_SITE_OTHER): Payer: Self-pay | Admitting: Internal Medicine

## 2018-01-15 DIAGNOSIS — Z7901 Long term (current) use of anticoagulants: Secondary | ICD-10-CM | POA: Diagnosis not present

## 2018-02-03 ENCOUNTER — Other Ambulatory Visit (INDEPENDENT_AMBULATORY_CARE_PROVIDER_SITE_OTHER): Payer: Self-pay | Admitting: Internal Medicine

## 2018-02-03 ENCOUNTER — Other Ambulatory Visit: Payer: Self-pay | Admitting: Cardiology

## 2018-03-10 ENCOUNTER — Other Ambulatory Visit (INDEPENDENT_AMBULATORY_CARE_PROVIDER_SITE_OTHER): Payer: Self-pay | Admitting: Internal Medicine

## 2018-03-26 ENCOUNTER — Other Ambulatory Visit: Payer: Self-pay | Admitting: Cardiology

## 2018-03-27 DIAGNOSIS — H2513 Age-related nuclear cataract, bilateral: Secondary | ICD-10-CM | POA: Diagnosis not present

## 2018-03-27 DIAGNOSIS — H40033 Anatomical narrow angle, bilateral: Secondary | ICD-10-CM | POA: Diagnosis not present

## 2018-03-28 NOTE — Progress Notes (Signed)
Cardiology Office Note  Date: 03/31/2018   ID: Sergio Boyle, DOB 06/06/1928, MRN [id number]  PCP: Sinda Du, MD  Primary Cardiologist: Rozann Lesches, MD   Chief Complaint  Patient presents with  . Coronary Artery Disease  . Atrial Fibrillation    History of Present Illness: Sergio Boyle is an 84 y.o. male last seen in November 2017.  He presents overdue for follow-up.  He is here with his son for a follow-up visit.  States that he is very sedentary, has difficulty getting out to make appointments.  He had an episode of possible angina several months ago but has not had to use any nitroglycerin, still has an old bottle.  We discussed getting a refill.  He remains on Coumadin with follow-up per Dr. Luan Pulling.  He does not report any spontaneous bleeding episodes.  He remains in atrial fibrillation by ECG which I reviewed today showing right bundle branch block, nonspecific ST changes, probable old anteroseptal infarct pattern.  We discussed obtaining a follow-up lipid panel and LFTs, also requesting last lab work from Dr. Luan Pulling.  Past Medical History:  Diagnosis Date  . Atrial fibrillation ([place])   . Cancer ([place])   . Coronary atherosclerosis of native coronary artery    Prior stenting of LAD and diagonal at [facility] with subsequent SVG to diagonal, LVEF 55-60%  . Dysphagia   . GERD   . Hiccups   . History of prostate cancer   . Mixed hyperlipidemia   . Weight loss     Past Surgical History:  Procedure Laterality Date  . Bilateral inguinal hernia repair    . bulging disc    . COLONOSCOPY  06/2010  . CORONARY ARTERY BYPASS GRAFT  1996    Emergent SVG to diagonal at [facility]  . ESOPHAGEAL DILATION N/A 06/10/2015   Procedure: ESOPHAGEAL DILATION;  Surgeon: Rogene Houston, MD;  Location: [facility];  Service: Endoscopy;  Laterality: N/A;  . ESOPHAGOGASTRODUODENOSCOPY     w/esophageal dilation   . ESOPHAGOGASTRODUODENOSCOPY N/A 06/10/2015   Procedure:  ESOPHAGOGASTRODUODENOSCOPY (EGD);  Surgeon: Rogene Houston, MD;  Location: [facility];  Service: Endoscopy;  Laterality: N/A;  245  . IR KYPHO LUMBAR INC FX REDUCE BONE BX UNI/BIL CANNULATION INC/IMAGING  03/07/2017  . IR RADIOLOGIST EVAL & MGMT  02/28/2017  . UPPER GASTROINTESTINAL ENDOSCOPY  06/2010    Current Outpatient Medications  Medication Sig Dispense Refill  . ALPRAZolam (XANAX) 0.5 MG tablet TAKE ONE TABLET BY MOUTH TWICE DAILY 60 tablet 0  . atorvastatin (LIPITOR) 20 MG tablet Take 20 mg by mouth at bedtime.     . cholecalciferol (VITAMIN D) 1000 UNITS tablet Take 1,000 Units by mouth daily.    . cyanocobalamin 500 MCG tablet Take 500 mcg by mouth daily.    . digoxin (LANOXIN) 0.125 MG tablet TAKE ONE-HALF TABLET BY MOUTH ONCE A DAY 15 tablet 0  . docusate sodium (COLACE) 100 MG capsule As needed if you go more than 24 hours without bowel movement while taking pain medication 30 capsule 0  . guaiFENesin (MUCINEX) 600 MG 12 hr tablet Take 300 mg by mouth 3 (three) times daily.     . magnesium oxide (MAG-OX 400) 400 MG tablet Take 1 tablet (400 mg total) by mouth every 7 (seven) days.    Marland Kitchen oxyCODONE-acetaminophen (PERCOCET/ROXICET) 5-325 MG tablet Take 1 tablet by mouth every 4 (four) hours as needed. 20 tablet 0  . pantoprazole (PROTONIX) 40 MG tablet TAKE  ONE TABLET EACH DAY BEFORE BREAKFAST 30 tablet 5  . Propylene Glycol (SYSTANE BALANCE OP) Place 1-2 drops into both eyes daily as needed.    . warfarin (COUMADIN) 3 MG tablet Take 3 mg by mouth daily.      No current facility-administered medications for this visit.    Allergies:  Sulfonamide derivatives   Social History: The patient  reports that he has never smoked. He has never used smokeless tobacco. He reports that he does not drink alcohol or use drugs.   ROS:  Please see the history of present illness. Otherwise, complete review of systems is positive for chronic fatigue, hearing loss.  All other systems are reviewed and  negative.   Physical Exam: VS:  BP 131/78   Pulse 82   Ht 5\' 11"  (1.803 m)   Wt 160 lb (72.6 kg)   SpO2 97%   BMI 22.32 kg/m , BMI Body mass index is 22.32 kg/m.  Wt Readings from Last 3 Encounters:  03/31/18 160 lb (72.6 kg)  06/11/17 156 lb (70.8 kg)  04/02/17 160 lb (72.6 kg)    General: Chronically ill-appearing elderly male.  In wheelchair. HEENT: Conjunctiva and lids normal, oropharynx clear. Neck: Supple, no elevated JVP or carotid bruits, no thyromegaly. Lungs: Irregularly irregular, nonlabored breathing at rest. Cardiac: Regular rate and rhythm, no S3, soft systolic murmur. Abdomen: Soft, nontender, bowel sounds present. Extremities: Mild ankle edema, distal pulses 2+.  ECG: I personally reviewed the tracing from 09/26/2016 which showed atrial fibrillation with right bundle branch block.  Recent Labwork:  May 2018: Hemoglobin 11.1, platelets 163, potassium 4.5, BUN 15, creatinine 1.1  Other Studies Reviewed Today:  Lexiscan Cardiolite 11/17/2013: IMPRESSION: Low risk Lexiscan Cardiolite. Atrial fibrillation with right bundle branch block present at baseline, no diagnostic ST segment abnormalities noted. Perfusion imaging is most consistent with soft tissue attenuation affecting the apical anteroseptal and anterolateral wall, no definite ischemia. LV volumes are normal with LVEF 77% and no wall motion abnormalities.  Assessment and Plan:  1.  Permanent atrial fibrillation.  He remains asymptomatic on Lanoxin for heart rate control and Coumadin for stroke prophylaxis which is followed by Dr. Luan Pulling.  No reported bleeding episodes.  Requesting last lab work from Dr. Luan Pulling.  2.  Mixed hyperlipidemia.  Continues on Lipitor.  Obtain follow-up FLP and LFTs.  3.  CAD status post previous LAD and diagonal intervention as well as SVG to diagonal at [facility].  We are continuing with conservative medical therapy at this point in the absence of progressive angina symptoms.   Refill provided for fresh bottle of nitroglycerin.  I reviewed his ECG which is stable.  Current medicines were reviewed with the patient today.   Orders Placed This Encounter  Procedures  . EKG 12-Lead    Disposition: Follow-up in 1 year.  Signed, Satira Sark, MD, [facility] 03/31/2018 2:06 PM    [facility] [address] [place], [place] [address] Phone: [phone]; Fax: [phone]

## 2018-03-31 ENCOUNTER — Ambulatory Visit: Payer: Medicare Other | Admitting: Cardiology

## 2018-03-31 ENCOUNTER — Encounter: Payer: Self-pay | Admitting: Cardiology

## 2018-03-31 VITALS — BP 131/78 | HR 82 | Ht 71.0 in | Wt 160.0 lb

## 2018-03-31 DIAGNOSIS — I482 Chronic atrial fibrillation: Secondary | ICD-10-CM | POA: Diagnosis not present

## 2018-03-31 DIAGNOSIS — E782 Mixed hyperlipidemia: Secondary | ICD-10-CM

## 2018-03-31 DIAGNOSIS — I25119 Atherosclerotic heart disease of native coronary artery with unspecified angina pectoris: Secondary | ICD-10-CM

## 2018-03-31 DIAGNOSIS — I4821 Permanent atrial fibrillation: Secondary | ICD-10-CM

## 2018-03-31 MED ORDER — NITROGLYCERIN 0.4 MG SL SUBL: 0.4000 mg | SUBLINGUAL_TABLET | SUBLINGUAL | 3 refills | Status: AC | PRN

## 2018-03-31 NOTE — Patient Instructions (Addendum)
Your physician wants you to follow-up in:   1 year  with Dr.McDowell You will receive a reminder letter in the mail two months in advance. If you don't receive a letter, please call our office to schedule the follow-up appointment.     Your physician recommends that you continue on your current medications as directed. Please refer to the Current Medication list given to you today.    If you need a refill on your cardiac medications before your next appointment, please call your pharmacy.     Get lab work done : LFT's, LIPIDS     I refilled nitroglycerin for you    No tests ordered today      Thank you for choosing [facility] !

## 2018-04-02 DIAGNOSIS — I25119 Atherosclerotic heart disease of native coronary artery with unspecified angina pectoris: Secondary | ICD-10-CM | POA: Diagnosis not present

## 2018-04-03 LAB — HEPATIC FUNCTION PANEL
ALBUMIN: 4 g/dL (ref 3.5–4.7)
ALT: 12 IU/L (ref 0–44)
AST: 14 IU/L (ref 0–40)
Alkaline Phosphatase: 107 IU/L (ref 39–117)
BILIRUBIN TOTAL: 0.5 mg/dL (ref 0.0–1.2)
Bilirubin, Direct: 0.17 mg/dL (ref 0.00–0.40)
Total Protein: 6.8 g/dL (ref 6.0–8.5)

## 2018-04-03 LAB — LIPID PANEL
CHOL/HDL RATIO: 4.2 ratio (ref 0.0–5.0)
Cholesterol, Total: 130 mg/dL (ref 100–199)
HDL: 31 mg/dL — ABNORMAL LOW (ref >39)
LDL Calculated: 75 mg/dL (ref 0–99)
TRIGLYCERIDES: 121 mg/dL (ref 0–149)
VLDL Cholesterol Cal: 24 mg/dL (ref 5–40)

## 2018-04-15 ENCOUNTER — Other Ambulatory Visit (INDEPENDENT_AMBULATORY_CARE_PROVIDER_SITE_OTHER): Payer: Self-pay | Admitting: Internal Medicine

## 2018-04-17 NOTE — Telephone Encounter (Signed)
Per Dr.Rehman the patient will need to have OV prior to further refills. He has been given a 2 month refill. Patient may see Terri.

## 2018-04-25 ENCOUNTER — Other Ambulatory Visit: Payer: Self-pay | Admitting: Cardiology

## 2018-05-06 DIAGNOSIS — N401 Enlarged prostate with lower urinary tract symptoms: Secondary | ICD-10-CM | POA: Diagnosis not present

## 2018-05-06 DIAGNOSIS — M5431 Sciatica, right side: Secondary | ICD-10-CM | POA: Diagnosis not present

## 2018-05-06 DIAGNOSIS — I4891 Unspecified atrial fibrillation: Secondary | ICD-10-CM | POA: Diagnosis not present

## 2018-05-06 DIAGNOSIS — Z7901 Long term (current) use of anticoagulants: Secondary | ICD-10-CM | POA: Diagnosis not present

## 2018-05-06 DIAGNOSIS — M545 Low back pain: Secondary | ICD-10-CM | POA: Diagnosis not present

## 2018-05-09 IMAGING — MR MR LUMBAR SPINE W/O CM
4 of 6 series · 15 of 48 positions shown · non-contrast
Comparison: CT lumbar spine 01/25/2017, lumbar MRI 10/10/2015

CLINICAL DATA: Low back pain.  Lumbar fracture.

EXAM:
MRI LUMBAR SPINE WITHOUT CONTRAST
TECHNIQUE: Multiplanar, multisequence MR imaging of the lumbar spine was
performed. No intravenous contrast was administered.

[Series 3: T2 · sagittal · 4.0mm · 0.68mm/px · 6 of 19 slices shown (1 of 2)]
[im 1/19]
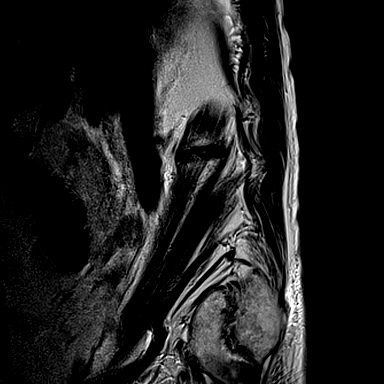
[im 4/19]
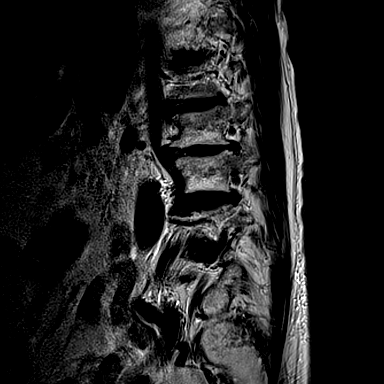
[im 8/19]
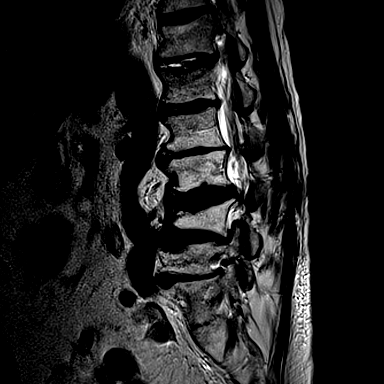
[im 11/19]
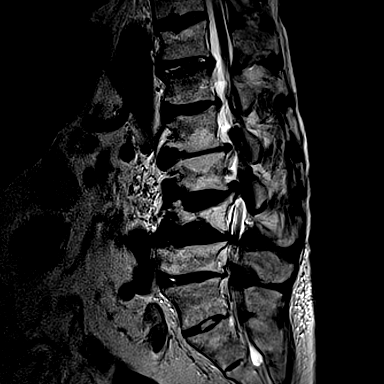
[im 15/19]
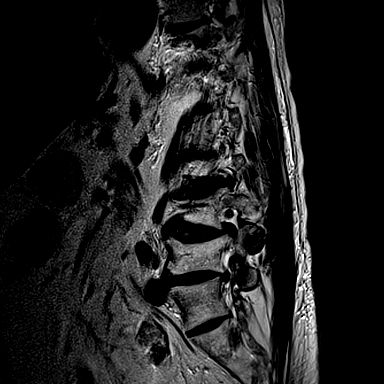
[im 19/19]
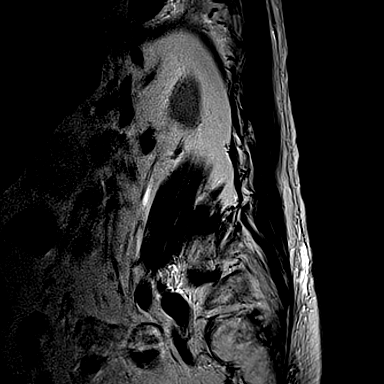

[Series 4: T1 · sagittal · 4.0mm · 0.34mm/px · 3 of 17 slices shown (1 of 2)]
[im 1/17]
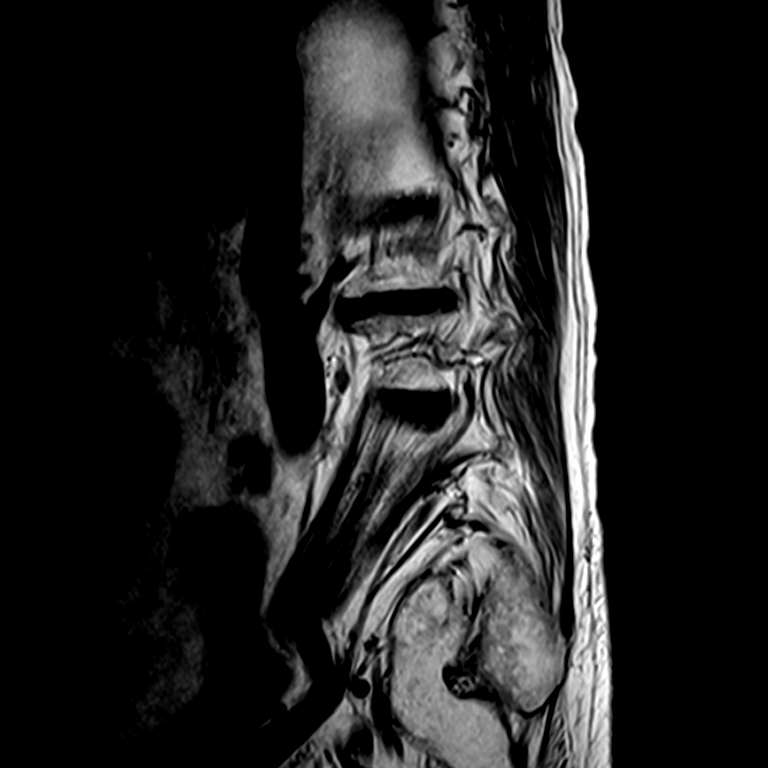
[im 9/17]
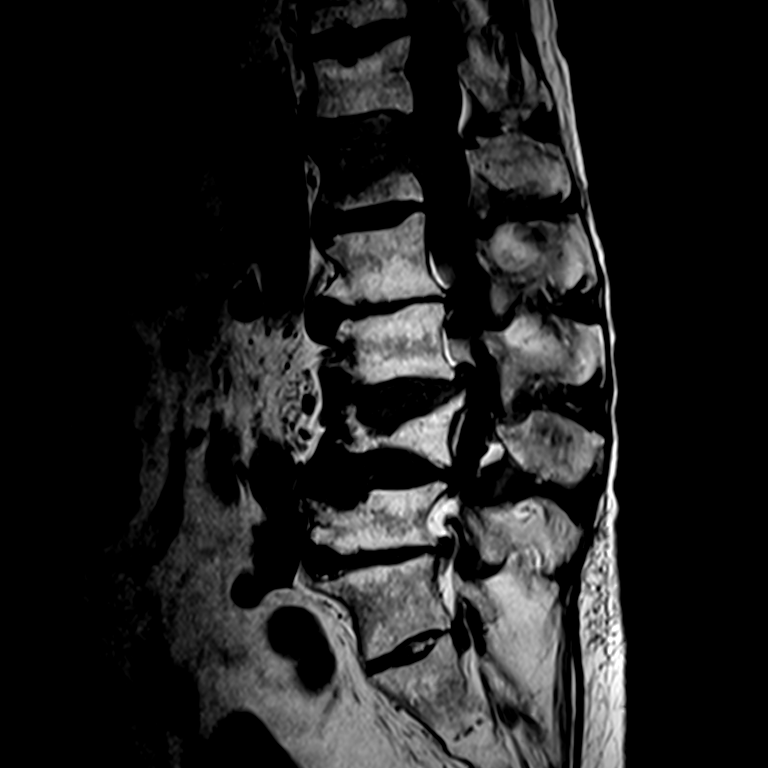
[im 17/17]
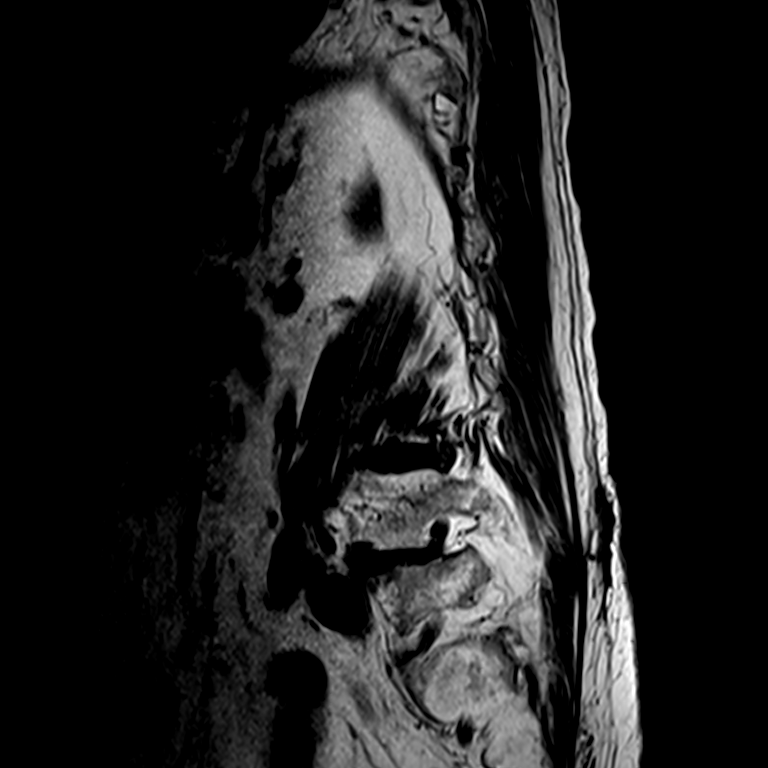

[Series 6: T2 · axial · 4.0mm · 0.23mm/px · z∈[-79,+66]mm · 3 of 49 slices shown (2 of 2)]
[im 8/49]
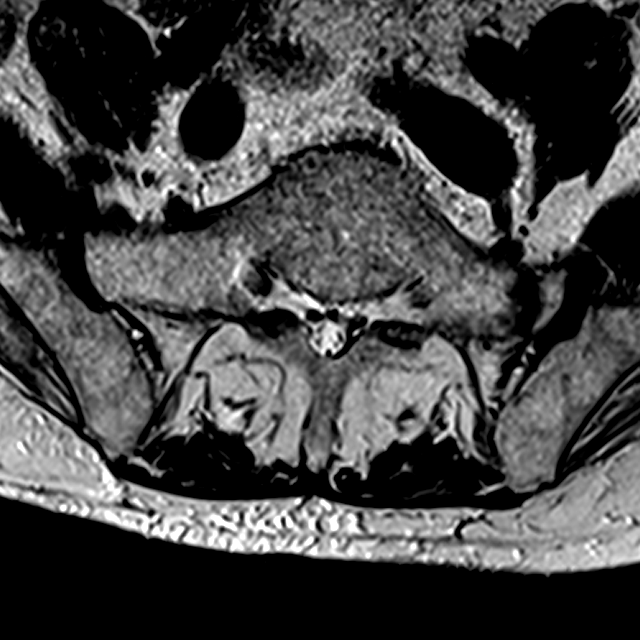
[im 26/49]
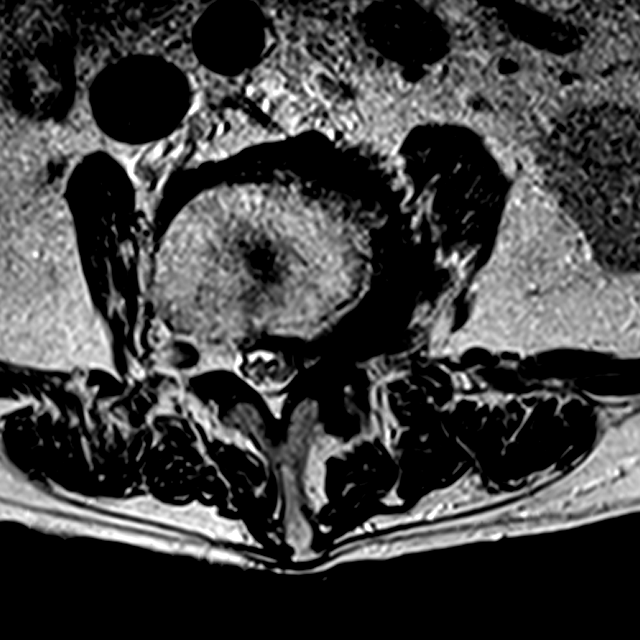
[im 41/49]
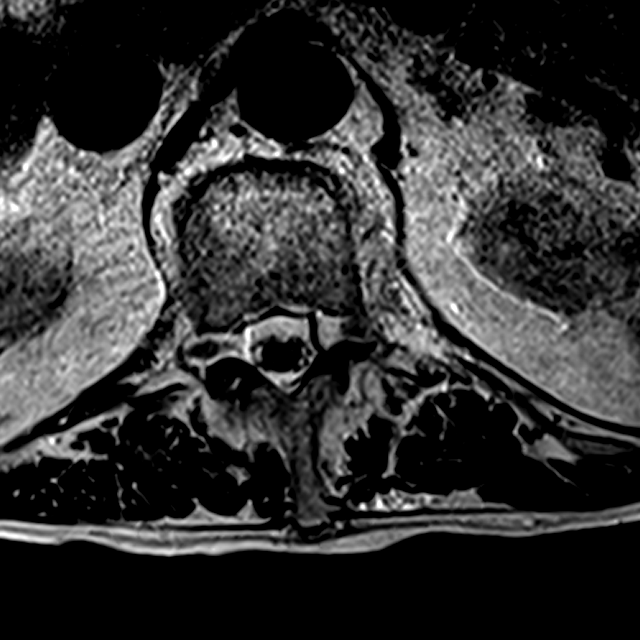

[Series 7: T1 · axial · 4.0mm · 0.20mm/px · z∈[-78,+66]mm · 3 of 49 slices shown (2 of 2)]
[im 8/49]
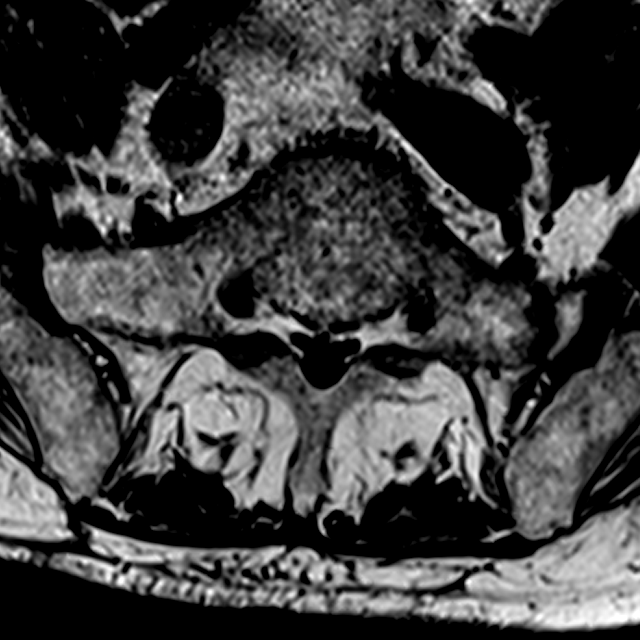
[im 26/49]
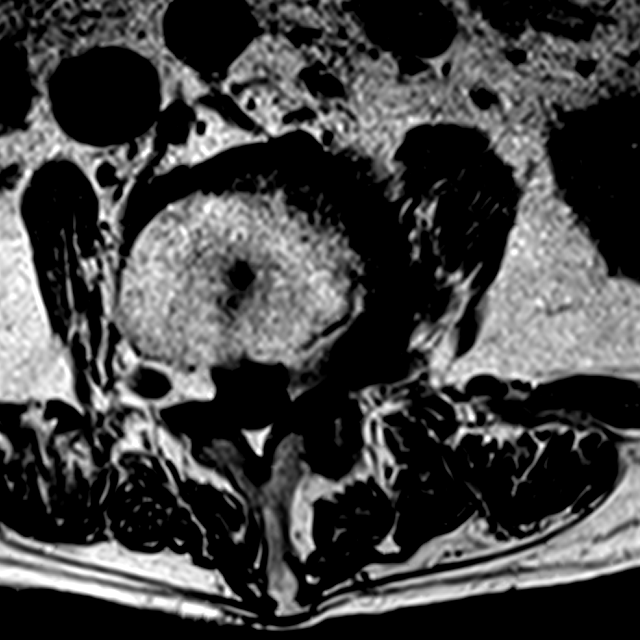
[im 41/49]
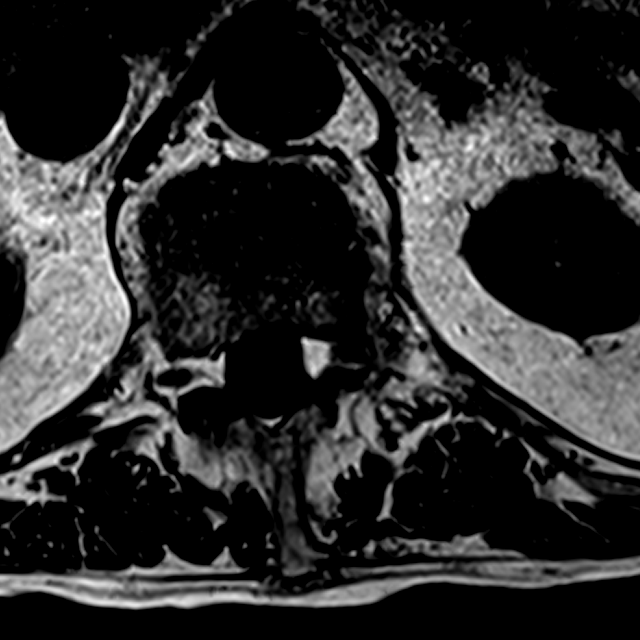

[15 of 48 positions shown; findings below may reference images not displayed]

FINDINGS: Segmentation: Normal segmentation. S1 is partially lumbarized with a
small disc space at S1-2. This is consistent with prior numbering.

Alignment: Mild anterolisthesis L3-4. Reversal of the lumbar
lordosis with mild to moderate kyphosis.

Vertebrae: Multiple lumbar compression fractures. L1 fracture is
acute with diffuse bone marrow edema. Superior endplate fracture
with approximately 10-15% loss of vertebral body height. No
retropulsion of bone into the canal

Mild chronic fracture T12

Severe chronic compression fracture L4

Moderate compression fracture L5 which is chronic.

Conus medullaris: Extends to the L2-3 level and appears normal.

Paraspinal and other soft tissues: No paraspinous mass or hematoma.
No retroperitoneal adenopathy.

Disc levels:

T12-L1: Disc degeneration with mild spurring. No significant
stenosis.

L1-2: Disc degeneration with disc bulging and spurring. Mild facet
degeneration. No significant stenosis.

L2-3: Advanced disc degeneration. Diffuse endplate spurring left
greater than right. Large left-sided osteophyte compressing the
thecal sac and subarticular zone. Moderately severe spinal stenosis,
left greater than right. Neural foramina adequately patent.

L3-4: Disc bulging and prominent endplate spurring left greater than
right. Some of this is related to fracture. There is severe spinal
stenosis. Severe subarticular stenosis bilaterally as well as severe
left foraminal encroachment with impingement of the left L3 and L4
nerve roots. Right L4 nerve root impingement.

L4-5: Diffuse disc bulging with endplate spurring. Severe spinal
stenosis. Bilateral facet degeneration. Mild foraminal narrowing
bilaterally. Subarticular stenosis bilaterally

L5-S1: Moderate disc degeneration with endplate spurring. Moderate
right foraminal encroachment due to spurring. Mild spinal stenosis.
IMPRESSION: 10-15% acute fracture of L1 appears benign

Chronic fractures of T12, L4, L5

Advanced multilevel degenerative change. Moderate spinal stenosis
L2-3. Severe spinal stenosis at L3-4 and L4-5. Severe subarticular
and foraminal stenosis on the left at L3-4.

## 2018-05-15 ENCOUNTER — Other Ambulatory Visit (INDEPENDENT_AMBULATORY_CARE_PROVIDER_SITE_OTHER): Payer: Self-pay | Admitting: Internal Medicine

## 2018-06-06 IMAGING — XA IR KYPHO VERTEBRAL LUMBAR AUGMENTATION
1 series · 13 of 19 positions shown · non-contrast
Comparison: MRI scan of the lumbar sacral spine of 02/07/2017.

INDICATION: Severe low back pain secondary to compression fracture at L1.

EXAM:
KYPHOPLASTY AT L1

[Series 300: spine · 13 of 19 slices shown]
[im 1/19]
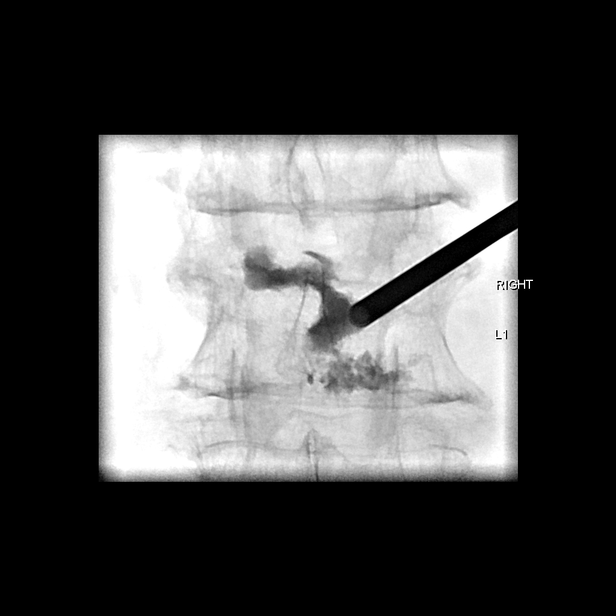
[im 3/19]
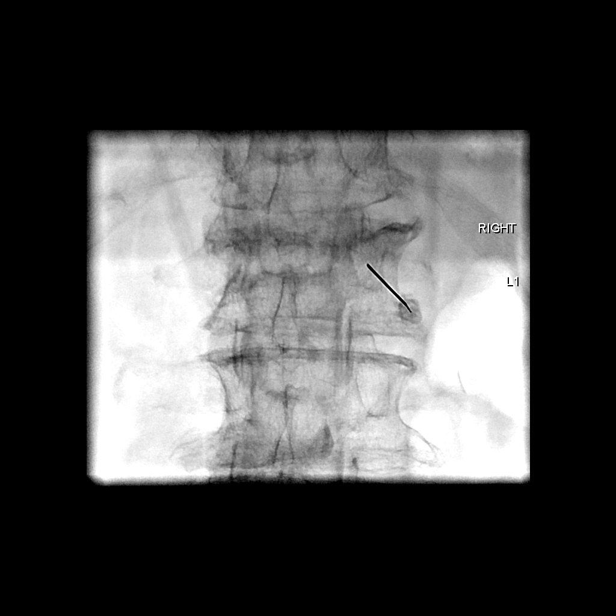
[im 4/19]
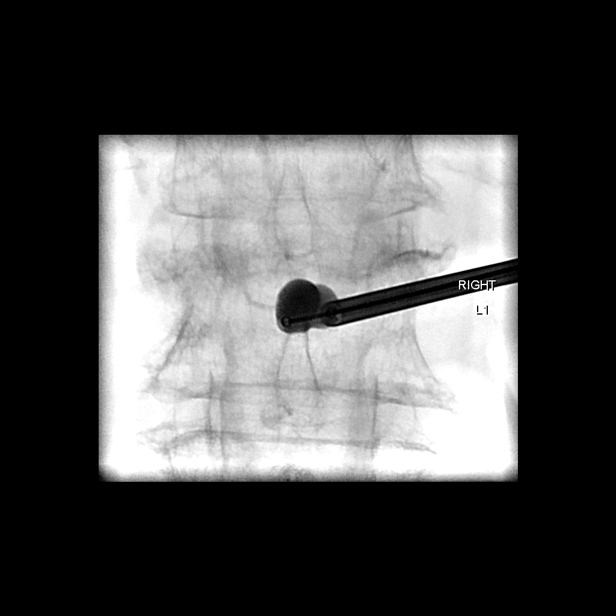
[im 6/19]
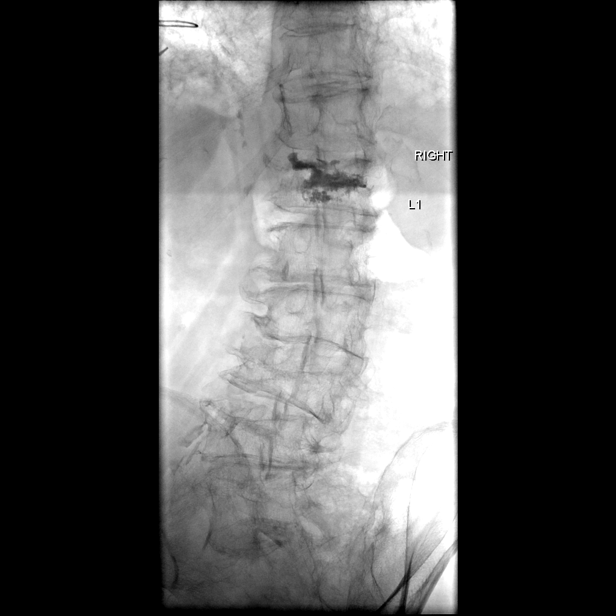
[im 7/19]
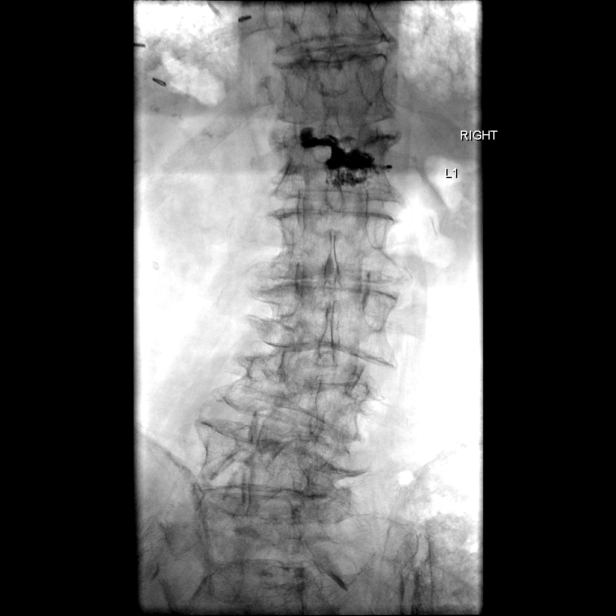
[im 9/19]
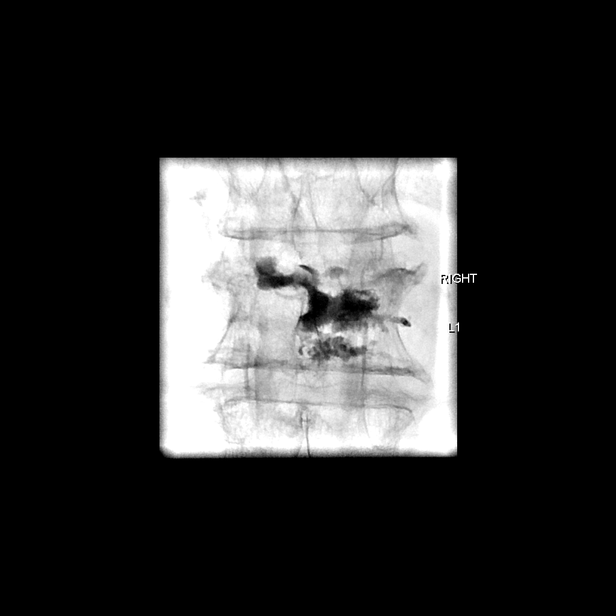
[im 10/19]
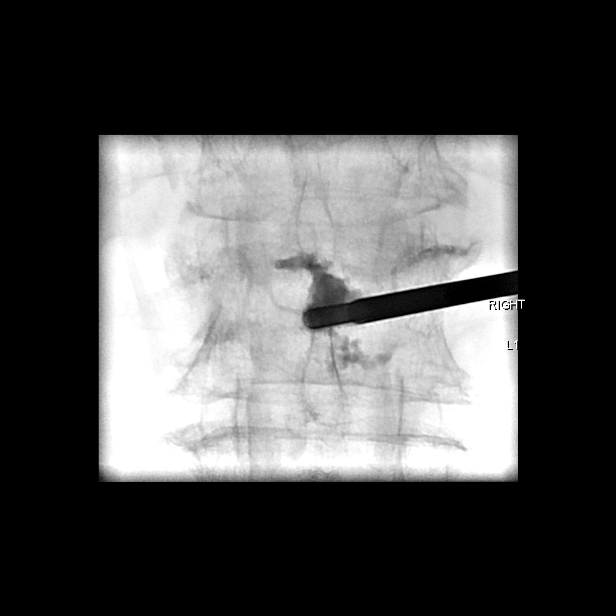
[im 11/19]
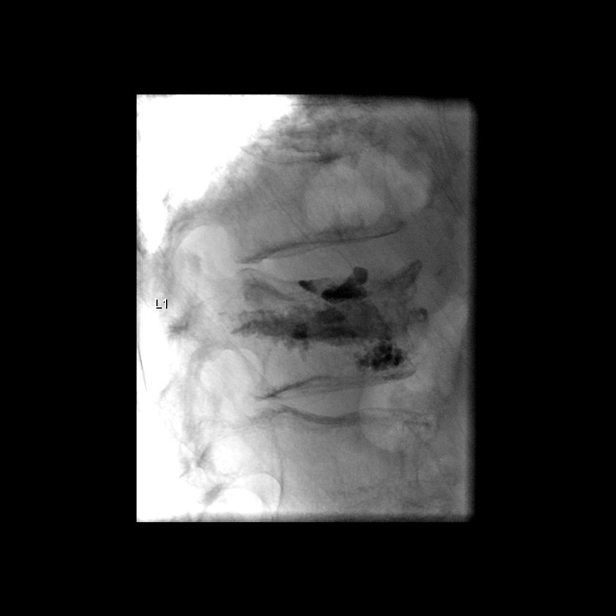
[im 13/19]
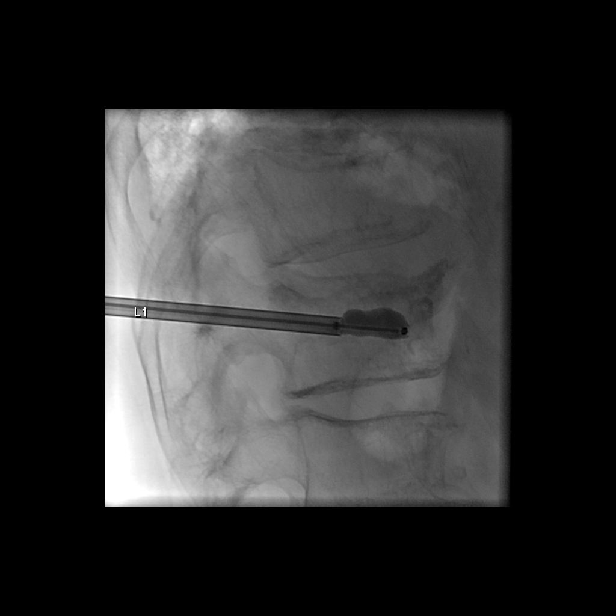
[im 14/19]
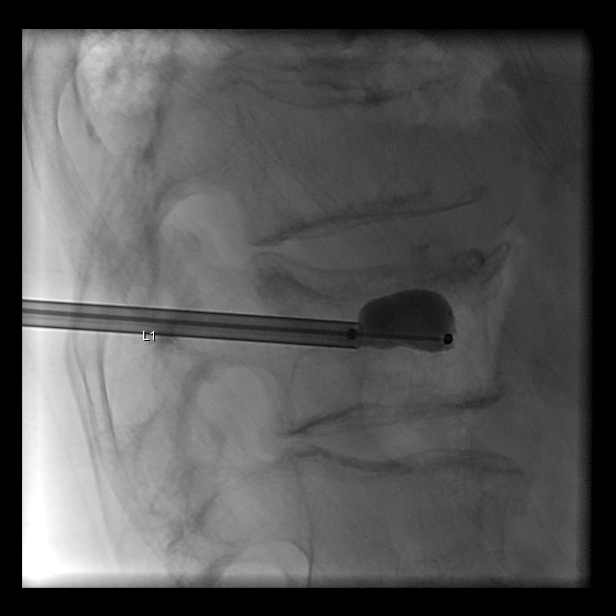
[im 16/19]
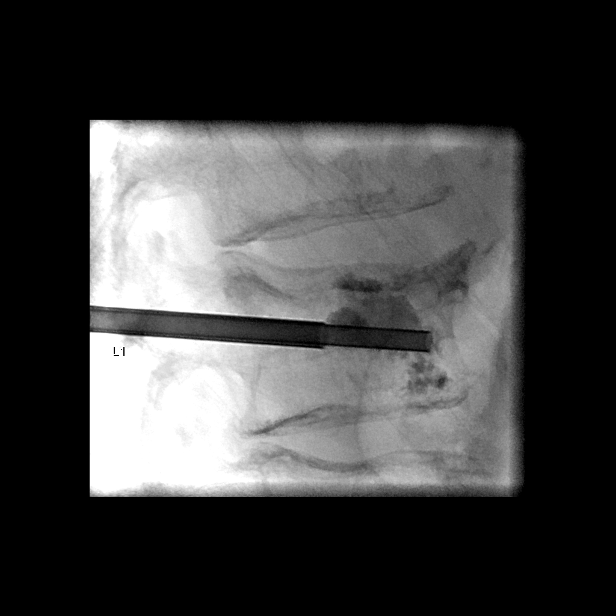
[im 17/19]
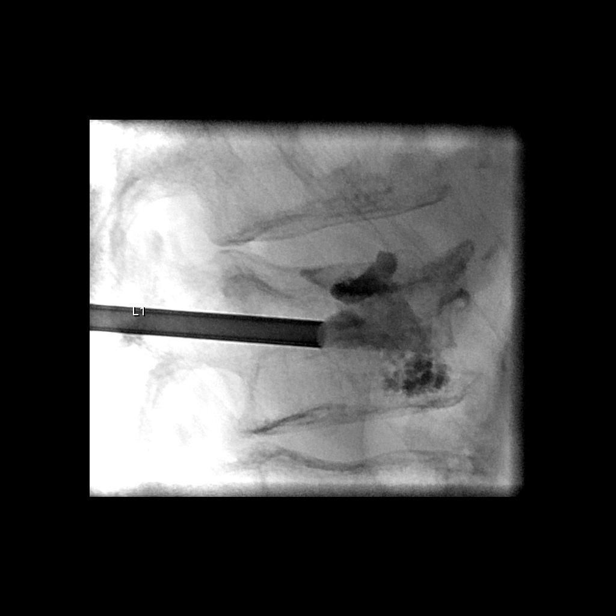
[im 19/19]
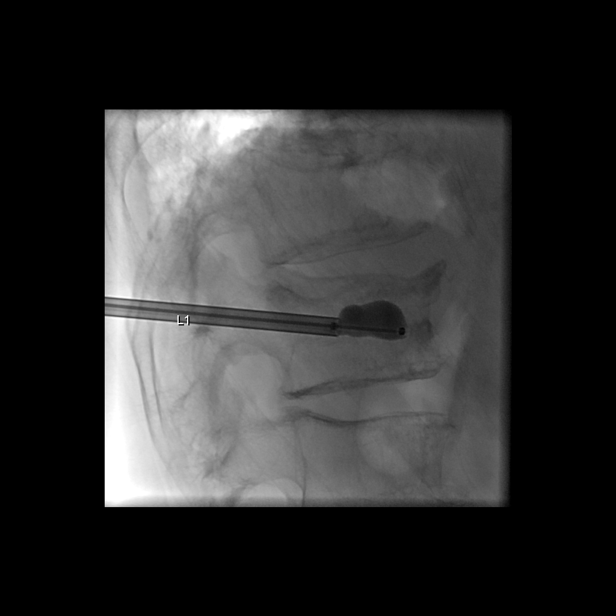

[13 of 19 positions shown; findings below may reference images not displayed]

MEDICATIONS:
As antibiotic prophylaxis, 2 g Ancef IV was ordered pre-procedure
and administered intravenously within 1 hour of incision.

ANESTHESIA/SEDATION:
Moderate (conscious) sedation was employed during this procedure. A
total of Versed 2 mg and Fentanyl 75 mcg, Dilaudid 1 mg IV was
administered intravenously.

Moderate Sedation Time: 28 minutes. The patient's level of
consciousness and vital signs were monitored continuously by
radiology nursing throughout the procedure under my direct
supervision.

FLUOROSCOPY TIME:  Fluoroscopy Time: 11 minutes 24 seconds (9195
mGy)

COMPLICATIONS:
None immediate.

PROCEDURE:
Following a full explanation of the procedure along with the
potential associated complications, an informed witnessed consent
was obtained.

The patient was placed prone on the fluoroscopic table. The skin
overlying the lumbar region was then prepped and draped in the usual
sterile fashion. The right pedicle at L1 was then infiltrated with
0.25% bupivacaine followed by the advancement of an 11-gauge
Jamshidi needle through the right pedicle into the posterior
one-third at L1. This was then exchanged for a Kyphon advanced osteo
introducer system comprised of a working cannula and a Kyphon osteo
drill.

This combination was then advanced over a Kyphon osteo bone pin
until the tip of the Kyphon osteo drill was in the posterior third
at L1.

At this time, the bone pin was removed. In a medial trajectory, the
combination was advanced until the tip of the working cannula was
inside the posterior one-third at L1.

Through the working cannula, a Kyphon inflatable bone tamp 20 x 3
was advanced and positioned with the distal marker 5 mm from the
anterior aspect of L1. Crossing of the midline was seen on the AP
projection. At this time, the balloon was expanded using contrast
via a Kyphon inflation syringe device via microtubing.

Inflations were continued until there was apposition with the
superior and the inferior endplates.

At this time, methylmethacrylate mixture was reconstituted with
Tobramycin in the Kyphon bone mixing device system. This was then
loaded onto the Kyphon bone fillers.

The balloon was deflated and removed followed by the instillation of
3 bone filler equivalents of methylmethacrylate mixture at L1 with
excellent filling in the AP and lateral projections. No
extravasation was noted in the disk spaces or posteriorly into the
spinal canal. A very small amount of methylmethacrylate mixture was
seen projecting into a right-sided paravertebral vein, were it
remained stable throughout the procedure.

The working cannula and the bone filler were then retrieved and
removed. Hemostasis was achieved at the skin entry site. The patient
was then monitored for 3 hours in the [REDACTED]. Patient was
then discharged home under the care of his wife and daughter.
IMPRESSION: 1. Status post vertebral body augmentation using balloon kyphoplasty
at L1 as described without event.

## 2018-07-21 ENCOUNTER — Ambulatory Visit (INDEPENDENT_AMBULATORY_CARE_PROVIDER_SITE_OTHER): Payer: Medicare Other | Admitting: Internal Medicine

## 2018-07-22 ENCOUNTER — Ambulatory Visit (INDEPENDENT_AMBULATORY_CARE_PROVIDER_SITE_OTHER): Payer: Medicare Other | Admitting: Internal Medicine

## 2018-07-29 ENCOUNTER — Other Ambulatory Visit (HOSPITAL_COMMUNITY): Payer: Self-pay | Admitting: Pulmonary Disease

## 2018-07-29 DIAGNOSIS — J8410 Pulmonary fibrosis, unspecified: Secondary | ICD-10-CM

## 2018-08-05 DIAGNOSIS — H348322 Tributary (branch) retinal vein occlusion, left eye, stable: Secondary | ICD-10-CM | POA: Diagnosis not present

## 2018-08-05 DIAGNOSIS — H353132 Nonexudative age-related macular degeneration, bilateral, intermediate dry stage: Secondary | ICD-10-CM | POA: Diagnosis not present

## 2018-08-05 DIAGNOSIS — H43813 Vitreous degeneration, bilateral: Secondary | ICD-10-CM | POA: Diagnosis not present

## 2018-08-05 DIAGNOSIS — Z9889 Other specified postprocedural states: Secondary | ICD-10-CM | POA: Diagnosis not present

## 2018-08-13 ENCOUNTER — Ambulatory Visit (HOSPITAL_COMMUNITY)
Admission: RE | Admit: 2018-08-13 | Discharge: 2018-08-13 | Disposition: A | Discharge status: Home / Self Care | Payer: Medicare Other | Source: Ambulatory Visit | Attending: Pulmonary Disease | Admitting: Pulmonary Disease

## 2018-08-13 DIAGNOSIS — I700 Atherosclerosis of aorta: Secondary | ICD-10-CM | POA: Diagnosis not present

## 2018-08-13 DIAGNOSIS — R932 Abnormal findings on diagnostic imaging of liver and biliary tract: Secondary | ICD-10-CM | POA: Insufficient documentation

## 2018-08-13 DIAGNOSIS — R918 Other nonspecific abnormal finding of lung field: Secondary | ICD-10-CM | POA: Insufficient documentation

## 2018-08-13 DIAGNOSIS — J90 Pleural effusion, not elsewhere classified: Secondary | ICD-10-CM | POA: Diagnosis not present

## 2018-08-13 DIAGNOSIS — R590 Localized enlarged lymph nodes: Secondary | ICD-10-CM | POA: Insufficient documentation

## 2018-08-13 DIAGNOSIS — J8410 Pulmonary fibrosis, unspecified: Secondary | ICD-10-CM | POA: Diagnosis not present

## 2018-08-23 ENCOUNTER — Other Ambulatory Visit: Payer: Self-pay | Admitting: Cardiology

## 2018-08-25 DIAGNOSIS — R5383 Other fatigue: Secondary | ICD-10-CM | POA: Diagnosis not present

## 2018-08-25 DIAGNOSIS — R0602 Shortness of breath: Secondary | ICD-10-CM | POA: Diagnosis not present

## 2018-08-25 DIAGNOSIS — I4891 Unspecified atrial fibrillation: Secondary | ICD-10-CM | POA: Diagnosis not present

## 2018-08-25 DIAGNOSIS — Z23 Encounter for immunization: Secondary | ICD-10-CM | POA: Diagnosis not present

## 2018-11-14 ENCOUNTER — Other Ambulatory Visit (INDEPENDENT_AMBULATORY_CARE_PROVIDER_SITE_OTHER): Payer: Self-pay | Admitting: Internal Medicine

## 2018-12-24 DIAGNOSIS — M545 Low back pain: Secondary | ICD-10-CM | POA: Diagnosis not present

## 2018-12-24 DIAGNOSIS — I4891 Unspecified atrial fibrillation: Secondary | ICD-10-CM | POA: Diagnosis not present

## 2018-12-24 DIAGNOSIS — M542 Cervicalgia: Secondary | ICD-10-CM | POA: Diagnosis not present

## 2019-02-23 ENCOUNTER — Emergency Department (HOSPITAL_COMMUNITY): Payer: Medicare Other

## 2019-02-23 ENCOUNTER — Encounter (HOSPITAL_COMMUNITY): Payer: Self-pay

## 2019-02-23 ENCOUNTER — Inpatient Hospital Stay (HOSPITAL_COMMUNITY)
Admission: EM | Admit: 2019-02-23 | Discharge: 2019-02-26 | DRG: 291 | Disposition: A | Discharge status: Skilled Nursing Facility | Payer: Medicare Other | Attending: Pulmonary Disease | Admitting: Pulmonary Disease

## 2019-02-23 ENCOUNTER — Other Ambulatory Visit: Payer: Self-pay

## 2019-02-23 DIAGNOSIS — N182 Chronic kidney disease, stage 2 (mild): Secondary | ICD-10-CM | POA: Diagnosis present

## 2019-02-23 DIAGNOSIS — Z1159 Encounter for screening for other viral diseases: Secondary | ICD-10-CM | POA: Diagnosis not present

## 2019-02-23 DIAGNOSIS — I482 Chronic atrial fibrillation, unspecified: Secondary | ICD-10-CM | POA: Diagnosis not present

## 2019-02-23 DIAGNOSIS — I5031 Acute diastolic (congestive) heart failure: Secondary | ICD-10-CM | POA: Diagnosis not present

## 2019-02-23 DIAGNOSIS — Z66 Do not resuscitate: Secondary | ICD-10-CM | POA: Diagnosis present

## 2019-02-23 DIAGNOSIS — M545 Low back pain: Secondary | ICD-10-CM | POA: Diagnosis present

## 2019-02-23 DIAGNOSIS — R0603 Acute respiratory distress: Secondary | ICD-10-CM | POA: Diagnosis present

## 2019-02-23 DIAGNOSIS — R1310 Dysphagia, unspecified: Secondary | ICD-10-CM | POA: Diagnosis present

## 2019-02-23 DIAGNOSIS — I5082 Biventricular heart failure: Secondary | ICD-10-CM | POA: Diagnosis present

## 2019-02-23 DIAGNOSIS — Z951 Presence of aortocoronary bypass graft: Secondary | ICD-10-CM

## 2019-02-23 DIAGNOSIS — I4892 Unspecified atrial flutter: Secondary | ICD-10-CM | POA: Diagnosis present

## 2019-02-23 DIAGNOSIS — I4821 Permanent atrial fibrillation: Secondary | ICD-10-CM | POA: Diagnosis not present

## 2019-02-23 DIAGNOSIS — I4510 Unspecified right bundle-branch block: Secondary | ICD-10-CM | POA: Diagnosis present

## 2019-02-23 DIAGNOSIS — Z955 Presence of coronary angioplasty implant and graft: Secondary | ICD-10-CM

## 2019-02-23 DIAGNOSIS — G8929 Other chronic pain: Secondary | ICD-10-CM | POA: Diagnosis present

## 2019-02-23 DIAGNOSIS — R54 Age-related physical debility: Secondary | ICD-10-CM | POA: Diagnosis present

## 2019-02-23 DIAGNOSIS — I4891 Unspecified atrial fibrillation: Secondary | ICD-10-CM | POA: Diagnosis present

## 2019-02-23 DIAGNOSIS — B952 Enterococcus as the cause of diseases classified elsewhere: Secondary | ICD-10-CM | POA: Diagnosis present

## 2019-02-23 DIAGNOSIS — R531 Weakness: Secondary | ICD-10-CM | POA: Diagnosis present

## 2019-02-23 DIAGNOSIS — Z7901 Long term (current) use of anticoagulants: Secondary | ICD-10-CM

## 2019-02-23 DIAGNOSIS — I361 Nonrheumatic tricuspid (valve) insufficiency: Secondary | ICD-10-CM | POA: Diagnosis not present

## 2019-02-23 DIAGNOSIS — I452 Bifascicular block: Secondary | ICD-10-CM | POA: Diagnosis present

## 2019-02-23 DIAGNOSIS — F329 Major depressive disorder, single episode, unspecified: Secondary | ICD-10-CM | POA: Diagnosis present

## 2019-02-23 DIAGNOSIS — E782 Mixed hyperlipidemia: Secondary | ICD-10-CM | POA: Diagnosis present

## 2019-02-23 DIAGNOSIS — N390 Urinary tract infection, site not specified: Secondary | ICD-10-CM | POA: Diagnosis present

## 2019-02-23 DIAGNOSIS — R627 Adult failure to thrive: Secondary | ICD-10-CM | POA: Diagnosis present

## 2019-02-23 DIAGNOSIS — I2510 Atherosclerotic heart disease of native coronary artery without angina pectoris: Secondary | ICD-10-CM | POA: Diagnosis present

## 2019-02-23 DIAGNOSIS — F419 Anxiety disorder, unspecified: Secondary | ICD-10-CM | POA: Diagnosis present

## 2019-02-23 DIAGNOSIS — Z9181 History of falling: Secondary | ICD-10-CM

## 2019-02-23 DIAGNOSIS — Z8249 Family history of ischemic heart disease and other diseases of the circulatory system: Secondary | ICD-10-CM

## 2019-02-23 DIAGNOSIS — Z79899 Other long term (current) drug therapy: Secondary | ICD-10-CM

## 2019-02-23 DIAGNOSIS — I130 Hypertensive heart and chronic kidney disease with heart failure and stage 1 through stage 4 chronic kidney disease, or unspecified chronic kidney disease: Principal | ICD-10-CM | POA: Diagnosis present

## 2019-02-23 DIAGNOSIS — R0902 Hypoxemia: Secondary | ICD-10-CM | POA: Diagnosis present

## 2019-02-23 DIAGNOSIS — K219 Gastro-esophageal reflux disease without esophagitis: Secondary | ICD-10-CM | POA: Diagnosis present

## 2019-02-23 DIAGNOSIS — Z882 Allergy status to sulfonamides status: Secondary | ICD-10-CM

## 2019-02-23 DIAGNOSIS — M810 Age-related osteoporosis without current pathological fracture: Secondary | ICD-10-CM | POA: Diagnosis present

## 2019-02-23 DIAGNOSIS — Z8546 Personal history of malignant neoplasm of prostate: Secondary | ICD-10-CM

## 2019-02-23 DIAGNOSIS — I509 Heart failure, unspecified: Secondary | ICD-10-CM

## 2019-02-23 DIAGNOSIS — I5033 Acute on chronic diastolic (congestive) heart failure: Secondary | ICD-10-CM | POA: Diagnosis present

## 2019-02-23 DIAGNOSIS — I351 Nonrheumatic aortic (valve) insufficiency: Secondary | ICD-10-CM | POA: Diagnosis not present

## 2019-02-23 LAB — CBC WITH DIFFERENTIAL/PLATELET
Abs Immature Granulocytes: 0.02 10*3/uL (ref 0.00–0.07)
Basophils Absolute: 0 10*3/uL (ref 0.0–0.1)
Basophils Relative: 0 %
Eosinophils Absolute: 0.3 10*3/uL (ref 0.0–0.5)
Eosinophils Relative: 4 %
HCT: 34.8 % — ABNORMAL LOW (ref 39.0–52.0)
Hemoglobin: 10.8 g/dL — ABNORMAL LOW (ref 13.0–17.0)
Immature Granulocytes: 0 %
Lymphocytes Relative: 18 %
Lymphs Abs: 1.2 10*3/uL (ref 0.7–4.0)
MCH: 27.8 pg (ref 26.0–34.0)
MCHC: 31 g/dL (ref 30.0–36.0)
MCV: 89.7 fL (ref 80.0–100.0)
Monocytes Absolute: 0.8 10*3/uL (ref 0.1–1.0)
Monocytes Relative: 12 %
Neutro Abs: 4.5 10*3/uL (ref 1.7–7.7)
Neutrophils Relative %: 66 %
Platelets: 254 10*3/uL (ref 150–400)
RBC: 3.88 MIL/uL — ABNORMAL LOW (ref 4.22–5.81)
RDW: 17.1 % — ABNORMAL HIGH (ref 11.5–15.5)
WBC: 6.9 10*3/uL (ref 4.0–10.5)
nRBC: 0 % (ref 0.0–0.2)

## 2019-02-23 LAB — URINALYSIS, ROUTINE W REFLEX MICROSCOPIC
Bacteria, UA: NONE SEEN
Bilirubin Urine: NEGATIVE
Glucose, UA: NEGATIVE mg/dL
Ketones, ur: NEGATIVE mg/dL
Leukocytes,Ua: NEGATIVE
Nitrite: NEGATIVE
Protein, ur: NEGATIVE mg/dL
Specific Gravity, Urine: 1.008 (ref 1.005–1.030)
pH: 7 (ref 5.0–8.0)

## 2019-02-23 LAB — BASIC METABOLIC PANEL
Anion gap: 9 (ref 5–15)
BUN: 23 mg/dL (ref 8–23)
CO2: 24 mmol/L (ref 22–32)
Calcium: 8.9 mg/dL (ref 8.9–10.3)
Chloride: 104 mmol/L (ref 98–111)
Creatinine, Ser: 1.2 mg/dL (ref 0.61–1.24)
GFR calc Af Amer: >60 mL/min (ref >60)
GFR calc non Af Amer: 54 mL/min — ABNORMAL LOW (ref >60)
Glucose, Bld: 126 mg/dL — ABNORMAL HIGH (ref 70–99)
Potassium: 5 mmol/L (ref 3.5–5.1)
Sodium: 137 mmol/L (ref 135–145)

## 2019-02-23 LAB — PROTIME-INR
INR: 2.4 — ABNORMAL HIGH (ref 0.8–1.2)
Prothrombin Time: 26.1 seconds — ABNORMAL HIGH (ref 11.4–15.2)

## 2019-02-23 LAB — MAGNESIUM: Magnesium: 1.7 mg/dL (ref 1.7–2.4)

## 2019-02-23 LAB — BRAIN NATRIURETIC PEPTIDE: B Natriuretic Peptide: 225 pg/mL — ABNORMAL HIGH (ref 0.0–100.0)

## 2019-02-23 LAB — TROPONIN I: Troponin I: <0.03 ng/mL (ref <0.03)

## 2019-02-23 MED ORDER — PANTOPRAZOLE SODIUM 40 MG PO TBEC
40.0000 mg | DELAYED_RELEASE_TABLET | Freq: Every day | ORAL | Status: DC
  Administered 2019-02-24 – 2019-02-26 (×3): 40 mg via ORAL
  Filled 2019-02-23 (×3): qty 1

## 2019-02-23 MED ORDER — DIGOXIN 125 MCG PO TABS
0.0625 mg | ORAL_TABLET | Freq: Every day | ORAL | Status: DC
  Administered 2019-02-24: 0.0625 mg via ORAL
  Filled 2019-02-23 (×2): qty 0.5
  Filled 2019-02-23: qty 1

## 2019-02-23 MED ORDER — ATORVASTATIN CALCIUM 20 MG PO TABS
20.0000 mg | ORAL_TABLET | Freq: Every day | ORAL | Status: DC
  Administered 2019-02-23 – 2019-02-25 (×3): 20 mg via ORAL
  Filled 2019-02-23 (×3): qty 1

## 2019-02-23 MED ORDER — FUROSEMIDE 10 MG/ML IJ SOLN
20.0000 mg | Freq: Two times a day (BID) | INTRAMUSCULAR | Status: DC
  Administered 2019-02-24 – 2019-02-25 (×3): 20 mg via INTRAVENOUS
  Filled 2019-02-23 (×3): qty 2

## 2019-02-23 MED ORDER — ACETAMINOPHEN 325 MG PO TABS: 650.0000 mg | ORAL_TABLET | Freq: Four times a day (QID) | ORAL | Status: DC | PRN

## 2019-02-23 MED ORDER — SODIUM CHLORIDE 0.9 % IV BOLUS
500.0000 mL | Freq: Once | INTRAVENOUS | Status: AC
  Administered 2019-02-23: 17:00:00 500 mL via INTRAVENOUS

## 2019-02-23 MED ORDER — FUROSEMIDE 10 MG/ML IJ SOLN
20.0000 mg | Freq: Once | INTRAMUSCULAR | Status: AC
  Administered 2019-02-23: 22:00:00 20 mg via INTRAVENOUS
  Filled 2019-02-23: qty 2

## 2019-02-23 MED ORDER — ACETAMINOPHEN 650 MG RE SUPP: 650.0000 mg | Freq: Four times a day (QID) | RECTAL | Status: DC | PRN

## 2019-02-23 MED ORDER — WARFARIN - PHARMACIST DOSING INPATIENT
Freq: Every day | Status: DC
  Administered 2019-02-24: 17:00:00

## 2019-02-23 MED ORDER — ENSURE ENLIVE PO LIQD
237.0000 mL | Freq: Two times a day (BID) | ORAL | Status: DC
  Administered 2019-02-24 – 2019-02-26 (×4): 237 mL via ORAL

## 2019-02-23 MED ORDER — WARFARIN SODIUM 2 MG PO TABS
3.0000 mg | ORAL_TABLET | Freq: Once | ORAL | Status: AC
  Administered 2019-02-23: 3 mg via ORAL
  Filled 2019-02-23 (×2): qty 1

## 2019-02-23 MED ORDER — ALPRAZOLAM 0.25 MG PO TABS
0.2500 mg | ORAL_TABLET | Freq: Two times a day (BID) | ORAL | Status: DC
  Administered 2019-02-23 – 2019-02-26 (×6): 0.25 mg via ORAL
  Filled 2019-02-23 (×6): qty 1

## 2019-02-23 MED ORDER — CEPHALEXIN 500 MG PO CAPS
500.0000 mg | ORAL_CAPSULE | Freq: Three times a day (TID) | ORAL | Status: DC
  Administered 2019-02-23: 500 mg via ORAL
  Filled 2019-02-23 (×2): qty 1

## 2019-02-23 NOTE — H&P (Signed)
History and Physical    PLEASE NOTE THAT DRAGON DICTATION SOFTWARE WAS USED IN THE CONSTRUCTION OF THIS NOTE.   ETHAN CLAYBURN BOF:[id number] DOB: 1928/07/09 DOA: 02/23/2019  PCP: Sinda Du, MD Patient coming from: home  I have personally briefly reviewed patient's old medical records in [place]  Chief Complaint: generalized weakness  HPI: Sergio Boyle is a 85 y.o. male with medical history significant for coronary artery disease status post CABG in 1996, hyperlipidemia, chronic atrial fibrillation chronically anticoagulated on warfarin, who is admitted to [facility] on 02/23/2019 with suspected acutely decompensated heart failure after presenting from home to [facility] ED complaining of generalized weakness.  The patient reports 1 to 2 weeks of generalized weakness in the absence of any acute focal weakness.  At baseline, the patient is reportedly able to ambulate around his house with a walker, but over the last 1 to 2 weeks has been unable to do so on the basis of feeling too weak in a general sense.  In this context, the patient's family members with whom he lives have reportedly needed to carry him from room to room, as he is feeling too weak in order to independently ambulate between these rooms.  The patient reports that he was diagnosed with a urinary tract infection approximately 7 days ago, and was started on a 10-day course of Keflex as treatment of such, with first doses occurring on 02/16/2019.  He reports that he has been extremely compliant in taking his Keflex as prescribed, but is experience no ensuing improvement in his generalized weakness after being on this antibiotic for the last week.  He denies any subjective fever, chills, rigors, dysuria, or gross hematuria.  He reports noting new onset mild edema in the bilateral lower extremities over the last 4 to 5 days.  He reports this is in the absence of any significant shortness of breath, orthopnea, or PND.  He also denies  any associated chest pain, palpitations, diaphoresis, nausea, or vomiting.  Denies any associated rhinitis, rhinorrhea, sore throat, or cough.  No known or suspected COVID-19 exposures.   The patient reports that he lives at home with his wife as well as his daughter and son-in-law.  In spite of living with these 3 individuals, the patient conveys his concern that these family members may not be able to personally provide care on a daily basis in proportion with his increased need for ADL assistance as well as full assist with transfers.   Past medical history is notable for history of coronary artery disease, status post stents to the LAD and diagonal followed by bypass involving SVG to the diagonal in 1996.  The patient denies any known history of underlying heart failure, and most recent echocardiogram appears to have occurred in July 2013 and showed mild LVH with focal basal hypertrophy, LVEF 55 to 60%, mild aortic regurgitation, and moderately dilated bilateral atria.     ED Course: Vital signs in the ED were notable for the following: Temperature max 97.8; heart rate 79-82; blood pressure ranged from 149/71 to 159/79; respiratory rate 17-22; oxygen saturation on room air noted to be 87%, which improved to 92 to 94% on 2 L nasal cannula.    Labs performed in the ED were notable for the following: BMP notable for sodium 137, creatinine 1.20 relative to most recent prior creatinine value of 1.1 in May 2018.  BNP 225 without any prior BNP data points for point of comparison.  Troponin x1-.  CBC notable for white blood cell count of 6900 with 66% neutrophils.  Urinalysis showed no white blood cells, no bacteria, and was nitrite and leukocyte esterace negative.  Chest x-ray, per final radiology report showed evidence of pulmonary edema as well as right greater than left pleural effusions.  EKG showed atrial fibrillation with ventricular rate 83, T wave inversions in V1 through V3, which were unchanged  relative to EKG from June 2019, and no evidence of ST changes.  In the context of presenting complaint of generalized weakness, the patient empirically received a 500 cc IV normal saline bolus via the ED provider.  Otherwise, he received no medications or IV fluids while in the ED.  Subsequently, the patient was admitted to the med telemetry floor for further evaluation and management of suspected acutely decompensated heart failure.    Review of Systems: As per HPI otherwise 10 point review of systems negative.   Past Medical History:  Diagnosis Date   Atrial fibrillation ([place])    Cancer ([place])    Coronary atherosclerosis of native coronary artery    Prior stenting of LAD and diagonal at [facility] with subsequent SVG to diagonal, LVEF 55-60%   Dysphagia    GERD    Hiccups    History of prostate cancer    Mixed hyperlipidemia    Weight loss     Past Surgical History:  Procedure Laterality Date   Bilateral inguinal hernia repair     bulging disc     COLONOSCOPY  06/2010   CORONARY ARTERY BYPASS GRAFT  1996    Emergent SVG to diagonal at [place] N/A 06/10/2015   Procedure: [place];  Surgeon: Rogene Houston, MD;  Location[facility] ENDO [facility];  Service: Endoscopy;  Laterality: N/A;   ESOPHAGOGASTRODUODENOSCOPY     w/esophageal dilation    ESOPHAGOGASTRODUODENOSCOPY N/A 06/10/2015   Procedure: ESOPHAGOGASTRODUODENOSCOPY (EGD);  Surgeon: Rogene Houston, MD;  Location: [facility] ENDO SU[facility];  Service: Endoscopy;  Laterality: N/A;  245   IR KYPHO LUMBAR INC FX REDUCE BONE BX UNI/BIL CANNULATION INC/IMAGING  03/07/2017   IR RADIOLOGIST EVAL & MGMT  02/28/2017   UPPER GASTROINTESTINAL ENDOSCOPY  06/2010    Social History:  reports that he has never smoked. He has never used smokeless tobacco. He reports that he does not drink alcohol or use drugs.   Allergies  Allergen Reactions   Sulfonamide Derivatives     Patient says his wife says he's allergic to  sulfa.    Family History  Problem Relation Age of Onset   Coronary artery disease Father      Prior to Admission medications   Medication Sig Start Date End Date Taking? Authorizing Provider  ALPRAZolam (XANAX) 0.5 MG tablet TAKE ONE TABLET BY MOUTH TWICE A DAY Patient taking differently: Take 0.25 mg by mouth 2 (two) times daily. *May take additional dose as needed for anxiety 04/17/18  Yes Rehman, Mechele Dawley, MD  atorvastatin (LIPITOR) 20 MG tablet Take 20 mg by mouth at bedtime.    Yes [provider]  cephALEXin (KEFLEX) 500 MG capsule Take 500 mg by mouth 3 (three) times daily. 10 day course starting on 02/16/2019 02/16/19  Yes [provider]  cholecalciferol (VITAMIN D) 1000 UNITS tablet Take 1,000 Units by mouth daily.   Yes [provider]  cyanocobalamin 500 MCG tablet Take 500 mcg by mouth daily.   Yes [provider]  digoxin (LANOXIN) 0.125 MG tablet TAKE ONE-HALF TABLET BY MOUTH  ONCE A DAY Patient taking differently: Take 0.0625 mg by mouth daily.  08/25/18  Yes Satira Sark, MD  guaiFENesin (MUCINEX) 600 MG 12 hr tablet Take 300 mg by mouth 3 (three) times daily.    Yes [provider]  magnesium oxide (MAG-OX 400) 400 MG tablet Take 1 tablet (400 mg total) by mouth every 7 (seven) days. 01/13/13  Yes Rehman, Mechele Dawley, MD  nitroGLYCERIN (NITROSTAT) 0.4 MG SL tablet Place 1 tablet (0.4 mg total) under the tongue every 5 (five) minutes as needed. 03/31/18  Yes Satira Sark, MD  oxyCODONE-acetaminophen (PERCOCET/ROXICET) 5-325 MG tablet Take 1 tablet by mouth every 4 (four) hours as needed. Patient taking differently: Take 1 tablet by mouth every 4 (four) hours as needed for moderate pain.  10/10/15  Yes Tanna Furry, MD  pantoprazole (PROTONIX) 40 MG tablet TAKE 1 TABLET BY MOUTH EVERY DAY BEFORE BREAKFAST Patient taking differently: Take 40 mg by mouth daily before breakfast.  11/17/18  Yes Rehman, Mechele Dawley, MD  Propylene Glycol  (SYSTANE BALANCE OP) Place 1-2 drops into both eyes daily as needed (for dry eye relief).    Yes [provider]  warfarin (COUMADIN) 3 MG tablet Take 3 mg by mouth every evening.  03/09/11  Yes [provider]  amoxicillin (AMOXIL) 500 MG capsule Take 500 mg by mouth 3 (three) times daily. 10 day course starting on 02/23/2019 02/23/19   [provider]     Objective     Physical Exam: Vitals:   02/23/19 1730 02/23/19 1800 02/23/19 1830 02/23/19 1900  BP: (!) 144/74 (!) 149/70 (!) 159/79 (!) 158/85  Pulse: 81 83 88 77  Resp: 17 18 (!) 24 (!) 21  Temp:      TempSrc:      SpO2: 92% 93% 92% 94%  Weight:      Height:        General: appears to be stated age; alert, oriented Skin: warm, dry, no rash Head:  AT/[place] Mouth:  Oral mucosa membranes appear moist, normal dentition Neck: supple; trachea midline Heart:  irregular; did not appreciate any M/R/G Lungs: bibasilar crackles noted; no wheezes or rhonchi.  Abdomen: + BS; soft, ND, NT Extremities: Trace to 1+ edema in the bilateral lower extremities; no muscle wasting.   Labs on Admission: I have personally reviewed following labs and imaging studies  CBC: Recent Labs  Lab 02/23/19 1739  WBC 6.9  NEUTROABS 4.5  HGB 10.8*  HCT 34.8*  MCV 89.7  PLT 160   Basic Metabolic Panel: Recent Labs  Lab 02/23/19 1739  NA 137  K 5.0  CL 104  CO2 24  GLUCOSE 126*  BUN 23  CREATININE 1.20  CALCIUM 8.9   GFR: Estimated Creatinine Clearance: 41.2 mL/min (by C-G formula based on SCr of 1.2 mg/dL). Liver Function Tests: No results for input(s): AST, ALT, ALKPHOS, BILITOT, PROT, ALBUMIN in the last 168 hours. No results for input(s): LIPASE, AMYLASE in the last 168 hours. No results for input(s): AMMONIA in the last 168 hours. Coagulation Profile: Recent Labs  Lab 02/23/19 1739  INR 2.4*   Cardiac Enzymes: Recent Labs  Lab 02/23/19 1739  TROPONINI <0.03   BNP (last 3 results) No results for  input(s): PROBNP in the last 8760 hours. HbA1C: No results for input(s): HGBA1C in the last 72 hours. CBG: No results for input(s): GLUCAP in the last 168 hours. Lipid Profile: No results for input(s): CHOL, HDL, LDLCALC, TRIG, CHOLHDL, LDLDIRECT in  the last 72 hours. Thyroid Function Tests: No results for input(s): TSH, T4TOTAL, FREET4, T3FREE, THYROIDAB in the last 72 hours. Anemia Panel: No results for input(s): VITAMINB12, FOLATE, FERRITIN, TIBC, IRON, RETICCTPCT in the last 72 hours. Urine analysis:    Component Value Date/Time   COLORURINE YELLOW 02/23/2019 1701   APPEARANCEUR CLEAR 02/23/2019 1701   LABSPEC 1.008 02/23/2019 1701   PHURINE 7.0 02/23/2019 1701   GLUCOSEU NEGATIVE 02/23/2019 1701   HGBUR SMALL (A) 02/23/2019 1701   BILIRUBINUR NEGATIVE 02/23/2019 1701   KETONESUR NEGATIVE 02/23/2019 1701   PROTEINUR NEGATIVE 02/23/2019 1701   NITRITE NEGATIVE 02/23/2019 1701   LEUKOCYTESUR NEGATIVE 02/23/2019 1701    Radiological Exams on Admission: Dg Chest 2 View  Result Date: 02/23/2019 CLINICAL DATA:  85 year old male with weakness and inability to walk EXAM: CHEST - 2 VIEW COMPARISON:  08/24/2017, CT 08/13/2018 FINDINGS: Cardiomediastinal silhouette unchanged in size and contour. Surgical changes of median sternotomy and CABG. Reticular pattern of opacity of the bilateral lungs, worsening since the prior. Interlobular septal thickening. Opacities at the lung bases with blunting of the bilateral costophrenic angles, worse on the right. Meniscus on the lateral view. IMPRESSION: Congestive heart failure superimposed on interstitial fibrosis, and right greater than left pleural effusion. Surgical changes of median sternotomy and CABG Electronically Signed   By: Corrie Mckusick D.O.   On: 02/23/2019 18:33     Assessment/Plan   Sergio Boyle is a 85 y.o. male with medical history significant for coronary artery disease status post CABG in 1996, hyperlipidemia, chronic atrial  fibrillation chronically anticoagulated on warfarin, who is admitted to [facility] on 02/23/2019 with suspected acutely decompensated heart failure after presenting from home to [facility] ED complaining of generalized weakness.   Principal Problem:   Acute diastolic CHF (congestive heart failure) (HCC) Active Problems:   Mixed hyperlipidemia   ATRIAL FIBRILLATION, CHRONIC   GERD   Generalized weakness    #) Acute heart failure with preserved EF: In the absence of any known underlying chronic heart failure, the patient presents with new onset edema involving the bilateral lower extremities, with evidence of mild presenting tachypnea as well as new onset acute hypoxic respiratory distress, and presenting chest x-ray showing evidence of pulmonary edema as well as bilateral pleural effusions.  The patient denies any recent chest pain, and no evidence of ACS at this time, with negative presenting troponin as well as EKG demonstrating chronic atrial fibrillation in the absence of any acute T wave or ST changes.  Most recent echocardiogram that I have encountered per chart review appears to have occurred in July 2013, with results as further discussed above.  The patient is on no chronic diuresis as an outpatient.  Plan: Lasix 20 mg IV twice daily, first dose now.  Monitor strict I's and O's as well as daily weights.  Monitor on telemetry.  Add serum magnesium level to labs collected in the ED.  Repeat BMP as well as serum magnesium level in the morning.  I have ordered an echocardiogram to occur in the morning as well.  PRN supplemental oxygen order to maintain oxygen saturations greater than or equal to 92%.    #) Generalized weakness: The patient reports 1 to 2 weeks of generalized weakness in which he is requiring full assist with transfers as well as significant assistance with ADL completion.  This is relative to his reported baseline in which he is able to ambulate with the aid of a walker, and able to  contribute to his own ADLs.  Suspect a relationship to presenting acutely decompensated heart failure, as above.  No evidence of underlying infectious process at this time aside from recently reported urinary tract infection which is status post 7 days of antibiotics, with repeat urinalysis performed today not suggestive of active infection.   Plan: Work-up and management of suspected acutely decompensated heart failure, as above.  Check TSH.  I have also placed a physical therapy consult to occur in the morning.  Check digoxin level.     #) Chronic atrial fibrillation: Chronically anticoagulated on warfarin in the setting of CHA2DS2-VASc score of 3 (2 for age and 1 for vascular disease).  Presenting INR found to be therapeutic at 2.4.  Is in rate controlled atrial fibrillation at time of this evening's presentation.  Plan: I have placed an inpatient pharmacy consult for assistance with warfarin dosing during this hospitalization.  Will monitor on telemetry in the context of suspected acutely decompensated heart failure with plan for ensuing IV diuresis.     #) Hyperlipidemia: On atorvastatin as an outpatient.  Plan: Continue home statin.     #) GERD: On Protonix at home.  Plan: Continue outpatient PPI.    DVT prophylaxis: home Warfarin Code Status: DNR (confirmed per my discussions with the patient this evening). Family Communication: none Disposition Plan: Per Rounding Team Consults called: (none)  Admission status: Inpatient; med telemetry.    PLEASE NOTE THAT DRAGON DICTATION SOFTWARE WAS USED IN THE CONSTRUCTION OF THIS NOTE.   [facility] Pager [phone] From 3PM- 11PM.   Otherwise, please contact night-coverage  [email or web address] Password [facility]  02/23/2019, 8:41 PM

## 2019-02-23 NOTE — ED Notes (Signed)
ED TO INPATIENT HANDOFF REPORT  ED Nurse Name and Phone #: Murrel Bertram,rn [phone]   S Name/Age/Gender Sergio Boyle 85 y.o. male Room/Bed: APA11/APA11  Code Status   Code Status: DNR  Home/SNF/Other Home Patient oriented to: self, place, time and situation Is this baseline? Yes   Triage Complete: Triage complete  Chief Complaint Weakness  Triage Note Pt with worsening weakness and inability to walk. Denies pain. Pt with multiple falls   Allergies Allergies  Allergen Reactions  . Sulfonamide Derivatives     Patient says his wife says he's allergic to sulfa.    Level of Care/Admitting Diagnosis ED Disposition    ED Disposition Condition Home Hospital Area: [facility] [[id number]]  Level of Care: Telemetry [5]  Covid Evaluation: N/A  Diagnosis: Generalized weakness [[id number]]  Admitting Physician: Rhetta Mura [[id number]]  Attending Physician: Rhetta Mura [[id number]]  Estimated length of stay: past midnight tomorrow  Certification:: I certify this patient will need inpatient services for at least 2 midnights  PT Class (Do Not Modify): Inpatient [101]  PT Acc Code (Do Not Modify): Private [1]       B Medical/Surgery History Past Medical History:  Diagnosis Date  . Atrial fibrillation ([place])   . Cancer ([place])   . Coronary atherosclerosis of native coronary artery    Prior stenting of LAD and diagonal at [facility] with subsequent SVG to diagonal, LVEF 55-60%  . Dysphagia   . GERD   . Hiccups   . History of prostate cancer   . Mixed hyperlipidemia   . Weight loss    Past Surgical History:  Procedure Laterality Date  . Bilateral inguinal hernia repair    . bulging disc    . COLONOSCOPY  06/2010  . CORONARY ARTERY BYPASS GRAFT  1996    Emergent SVG to diagonal at [facility]  . ESOPHAGEAL DILATION N/A 06/10/2015   Procedure: ESOPHAGEAL DILATION;  Surgeon: Rogene Houston, MD;  Location[facility];  Service: Endoscopy;  Laterality: N/A;  .  ESOPHAGOGASTRODUODENOSCOPY     w/esophageal dilation   . ESOPHAGOGASTRODUODENOSCOPY N/A 06/10/2015   Procedure: ESOPHAGOGASTRODUODENOSCOPY (EGD);  Surgeon: Rogene Houston, MD;  Location: [facility];  Service: Endoscopy;  Laterality: N/A;  245  . IR KYPHO LUMBAR INC FX REDUCE BONE BX UNI/BIL CANNULATION INC/IMAGING  03/07/2017  . IR RADIOLOGIST EVAL & MGMT  02/28/2017  . UPPER GASTROINTESTINAL ENDOSCOPY  06/2010     A IV Location/Drains/Wounds Patient Lines/Drains/Airways Status   Active Line/Drains/Airways    Name:   Placement date:   Placement time:   Site:   Days:   Peripheral IV 02/23/19 Right Antecubital   02/23/19    1721    Antecubital   less than 1   Urethral Catheter Aislin Onofre,RN  Coude 16 Fr.   02/23/19    2145    Coude   less than 1          Intake/Output Last 24 hours No intake or output data in the 24 hours ending 02/23/19 2222  Labs/Imaging Results for orders placed or performed during the hospital encounter of 02/23/19 (from the past 48 hour(s))  Urinalysis, Routine w reflex microscopic     Status: Abnormal   Collection Time: 02/23/19  5:01 PM  Result Value Ref Range   Color, Urine YELLOW YELLOW   APPearance CLEAR CLEAR   Specific Gravity, Urine 1.008 1.005 - 1.030   pH 7.0 5.0 - 8.0   Glucose,  UA NEGATIVE NEGATIVE mg/dL   Hgb urine dipstick SMALL (A) NEGATIVE   Bilirubin Urine NEGATIVE NEGATIVE   Ketones, ur NEGATIVE NEGATIVE mg/dL   Protein, ur NEGATIVE NEGATIVE mg/dL   Nitrite NEGATIVE NEGATIVE   Leukocytes,Ua NEGATIVE NEGATIVE   RBC / HPF 0-5 0 - 5 RBC/hpf   WBC, UA 0-5 0 - 5 WBC/hpf   Bacteria, UA NONE SEEN NONE SEEN    Comment: Performed at [facility], [address] [place], [place] [address]  Basic metabolic panel     Status: Abnormal   Collection Time: 02/23/19  5:39 PM  Result Value Ref Range   Sodium 137 135 - 145 mmol/L   Potassium 5.0 3.5 - 5.1 mmol/L   Chloride 104 98 - 111 mmol/L   CO2 24 22 - 32 mmol/L   Glucose, Bld 126 (H) 70 - 99 mg/dL    BUN 23 8 - 23 mg/dL   Creatinine, Ser 1.20 0.61 - 1.24 mg/dL   Calcium 8.9 8.9 - 10.3 mg/dL   GFR calc non Af Amer 54 (L) >60 mL/min   GFR calc Af Amer >60 >60 mL/min   Anion gap 9 5 - 15    Comment: Performed at [facility], [address] [place], [place] [address]  CBC with Differential     Status: Abnormal   Collection Time: 02/23/19  5:39 PM  Result Value Ref Range   WBC 6.9 4.0 - 10.5 K/uL   RBC 3.88 (L) 4.22 - 5.81 MIL/uL   Hemoglobin 10.8 (L) 13.0 - 17.0 g/dL   HCT 34.8 (L) 39.0 - 52.0 %   MCV 89.7 80.0 - 100.0 fL   MCH 27.8 26.0 - 34.0 pg   MCHC 31.0 30.0 - 36.0 g/dL   RDW 17.1 (H) 11.5 - 15.5 %   Platelets 254 150 - 400 K/uL   nRBC 0.0 0.0 - 0.2 %   Neutrophils Relative % 66 %   Neutro Abs 4.5 1.7 - 7.7 K/uL   Lymphocytes Relative 18 %   Lymphs Abs 1.2 0.7 - 4.0 K/uL   Monocytes Relative 12 %   Monocytes Absolute 0.8 0.1 - 1.0 K/uL   Eosinophils Relative 4 %   Eosinophils Absolute 0.3 0.0 - 0.5 K/uL   Basophils Relative 0 %   Basophils Absolute 0.0 0.0 - 0.1 K/uL   Immature Granulocytes 0 %   Abs Immature Granulocytes 0.02 0.00 - 0.07 K/uL    Comment: Performed at [facility], [address], [place], [place] [address]  Troponin I - Once     Status: None   Collection Time: 02/23/19  5:39 PM  Result Value Ref Range   Troponin I <0.03 <0.03 ng/mL    Comment: Performed at [facility], [address], [place], [place] [address]  Protime-INR     Status: Abnormal   Collection Time: 02/23/19  5:39 PM  Result Value Ref Range   Prothrombin Time 26.1 (H) 11.4 - 15.2 seconds   INR 2.4 (H) 0.8 - 1.2    Comment: (NOTE) INR goal varies based on device and disease states. Performed at [facility], [address], [place], [place] [address]   Brain natriuretic peptide     Status: Abnormal   Collection Time: 02/23/19  7:32 PM  Result Value Ref Range   B Natriuretic Peptide 225.0 (H) 0.0 - 100.0 pg/mL    Comment: Performed at [facility], [address]  [place],  [address]   Dg Chest 2 View  Result Date: 02/23/2019 CLINICAL DATA:  85 year old male with weakness and inability to walk EXAM: CHEST - 2 VIEW COMPARISON:  08/24/2017, CT 08/13/2018 FINDINGS: Cardiomediastinal silhouette unchanged in size and contour. Surgical changes of median sternotomy and CABG. Reticular pattern of opacity of the bilateral lungs, worsening since the prior. Interlobular septal thickening. Opacities at the lung bases with blunting of the bilateral costophrenic angles, worse on the right. Meniscus on the lateral view. IMPRESSION: Congestive heart failure superimposed on interstitial fibrosis, and right greater than left pleural effusion. Surgical changes of median sternotomy and CABG Electronically Signed   By: Corrie Mckusick D.O.   On: 02/23/2019 18:33    Pending Labs Unresulted Labs (From admission, onward)    Start     Ordered   02/25/19 0500  CBC  Every Mon-Wed-Fri (0500),   R     02/23/19 2221   02/24/19 0500  Magnesium  Tomorrow morning,   R     02/23/19 2207   02/24/19 0500  TSH  Tomorrow morning,   R     02/23/19 2207   02/24/19 1025  Basic metabolic panel  Tomorrow morning,   R     02/23/19 2207   02/24/19 0500  CBC  Tomorrow morning,   R     02/23/19 2207   02/24/19 0500  Digoxin level  Tomorrow morning,   R     02/23/19 2208   02/24/19 0500  Protime-INR  Daily,   R     02/23/19 2221   02/23/19 2146  Magnesium  Add-on,   R     02/23/19 2145          Vitals/Pain Today's Vitals   02/23/19 2000 02/23/19 2100 02/23/19 2130 02/23/19 2150  BP: 113/76 133/67 (!) 154/73   Pulse: 80 84 78   Resp: 17 (!) 21 (!) 23   Temp:      TempSrc:      SpO2: (!) 89% 100% 100%   Weight:      Height:      PainSc:    2     Isolation Precautions No active isolations  Medications Medications  cephALEXin (KEFLEX) capsule 500 mg (has no administration in time range)  atorvastatin (LIPITOR) tablet 20 mg (has no administration in time range)  digoxin  (LANOXIN) tablet 0.0625 mg (has no administration in time range)  ALPRAZolam (XANAX) tablet 0.25 mg (has no administration in time range)  pantoprazole (PROTONIX) EC tablet 40 mg (has no administration in time range)  acetaminophen (TYLENOL) tablet 650 mg (has no administration in time range)    Or  acetaminophen (TYLENOL) suppository 650 mg (has no administration in time range)  furosemide (LASIX) injection 20 mg (has no administration in time range)  warfarin (COUMADIN) tablet 3 mg (has no administration in time range)  Warfarin - Pharmacist Dosing Inpatient (has no administration in time range)  sodium chloride 0.9 % bolus 500 mL (0 mLs Intravenous Stopped 02/23/19 1845)  furosemide (LASIX) injection 20 mg (20 mg Intravenous Given 02/23/19 2145)    Mobility non-ambulatory High fall risk   Focused Assessments    R Recommendations: See Admitting Provider Note  Report given to:   Additional Notes:

## 2019-02-23 NOTE — ED Notes (Signed)
ED Provider at bedside. 

## 2019-02-23 NOTE — ED Notes (Addendum)
[phone]- please call with room number -daughter of pt

## 2019-02-23 NOTE — ED Provider Notes (Signed)
[facility] EMERGENCY DEPARTMENT Provider Note   CSN: [id number] Arrival date & time: 02/23/19  1626    History   Chief Complaint Chief Complaint  Patient presents with   Weakness    HPI Sergio Boyle is a 85 y.o. male.     The history is provided by the patient (patient's daughter whom I spoke with by telephone. ).  Weakness  Associated symptoms: frequency   Associated symptoms: no abdominal pain, no chest pain, no cough, no diarrhea, no dizziness, no fever, no headaches, no nausea, no shortness of breath and no vomiting      Sergio Boyle is a 85 y.o. male anti-coagulated on coumadin for chronic atrial fib,  presents to the Emergency Department complaining of generalized weakness and possible UTI.  patient and his daughter reports increasing weakness and inability to stand or walk more than a few steps.  Symptoms have been present since February.  He has been participating in rehab at home with little to no improvement.  Per the patient's daughter, he was walking with a walker initially, but since February family members are having to carry him from chair to bathroom most days.  He has also been treated by his PCP recently for a UTI.  Daughter states that she had an E-visit with PCP this morning and he was advised to have him come to ER for evaluation and possible nursing home placement.  patient reports occasional falls, but none recently.  Patient denies headache, dizziness, chest pain, shortness of breath, vomiting, diarrhea, fever or chills.     Past Medical History:  Diagnosis Date   Atrial fibrillation ([place])    Cancer ([place])    Coronary atherosclerosis of native coronary artery    Prior stenting of LAD and diagonal at [facility] with subsequent SVG to diagonal, LVEF 55-60%   Dysphagia    GERD    Hiccups    History of prostate cancer    Mixed hyperlipidemia    Weight loss     Patient Active Problem List   Diagnosis Date Noted   LBP (low back pain) 01/27/2014     Chronic neck pain 01/27/2014   Esophageal motility disorder 12/25/2011   Coronary atherosclerosis of native coronary artery 05/09/2010   Mixed hyperlipidemia 07/20/2009   ATRIAL FIBRILLATION, CHRONIC 07/20/2009   GERD 07/20/2009    Past Surgical History:  Procedure Laterality Date   Bilateral inguinal hernia repair     bulging disc     COLONOSCOPY  06/2010   CORONARY ARTERY BYPASS GRAFT  1996    Emergent SVG to diagonal at [facility] N/A 06/10/2015   Procedure: ESOPHAGEAL DILATION;  Surgeon: Rogene Houston, MD;  Location: [facility] ENDO SUITE;  Service: Endoscopy;  Laterality: N/A;   ESOPHAGOGASTRODUODENOSCOPY     w/esophageal dilation    ESOPHAGOGASTRODUODENOSCOPY N/A 06/10/2015   Procedure: ESOPHAGOGASTRODUODENOSCOPY (EGD);  Surgeon: Rogene Houston, MD;  Location: [facility] ENDO SUITE;  Service: Endoscopy;  Laterality: N/A;  245   IR KYPHO LUMBAR INC FX REDUCE BONE BX UNI/BIL CANNULATION INC/IMAGING  03/07/2017   IR RADIOLOGIST EVAL & MGMT  02/28/2017   UPPER GASTROINTESTINAL ENDOSCOPY  06/2010        Home Medications    Prior to Admission medications   Medication Sig Start Date End Date Taking? Authorizing Provider  ALPRAZolam Duanne Moron) 0.5 MG tablet TAKE ONE TABLET BY MOUTH TWICE A DAY 04/17/18   Rehman, Mechele Dawley, MD  atorvastatin (LIPITOR) 20 MG tablet Take 20  mg by mouth at bedtime.     [provider]  cholecalciferol (VITAMIN D) 1000 UNITS tablet Take 1,000 Units by mouth daily.    [provider]  cyanocobalamin 500 MCG tablet Take 500 mcg by mouth daily.    [provider]  digoxin (LANOXIN) 0.125 MG tablet TAKE ONE-HALF TABLET BY MOUTH ONCE A DAY 08/25/18   Satira Sark, MD  docusate sodium (COLACE) 100 MG capsule As needed if you go more than 24 hours without bowel movement while taking pain medication 10/10/15   Tanna Furry, MD  guaiFENesin (MUCINEX) 600 MG 12 hr tablet Take 300 mg by mouth 3 (three) times daily.      [provider]  magnesium oxide (MAG-OX 400) 400 MG tablet Take 1 tablet (400 mg total) by mouth every 7 (seven) days. 01/13/13   Rehman, Mechele Dawley, MD  nitroGLYCERIN (NITROSTAT) 0.4 MG SL tablet Place 1 tablet (0.4 mg total) under the tongue every 5 (five) minutes as needed. 03/31/18   Satira Sark, MD  oxyCODONE-acetaminophen (PERCOCET/ROXICET) 5-325 MG tablet Take 1 tablet by mouth every 4 (four) hours as needed. 10/10/15   Tanna Furry, MD  pantoprazole (PROTONIX) 40 MG tablet TAKE 1 TABLET BY MOUTH EVERY DAY BEFORE BREAKFAST 11/17/18   Rehman, Mechele Dawley, MD  Propylene Glycol (SYSTANE BALANCE OP) Place 1-2 drops into both eyes daily as needed.    [provider]  warfarin (COUMADIN) 3 MG tablet Take 3 mg by mouth daily.  03/09/11   [provider]    Family History Family History  Problem Relation Age of Onset   Coronary artery disease Father     Social History Social History   Tobacco Use   Smoking status: Never Smoker   Smokeless tobacco: Never Used  Substance Use Topics   Alcohol use: No    Alcohol/week: 0.0 standard drinks   Drug use: No     Allergies   Sulfonamide derivatives   Review of Systems Review of Systems  Constitutional: Positive for appetite change and unexpected weight change (weight loss). Negative for fever.  Eyes: Negative for visual disturbance.  Respiratory: Negative for cough, chest tightness and shortness of breath.   Cardiovascular: Negative for chest pain and leg swelling.  Gastrointestinal: Negative for abdominal pain, diarrhea, nausea and vomiting.  Genitourinary: Positive for frequency. Negative for decreased urine volume and flank pain.  Musculoskeletal: Negative for neck pain and neck stiffness.  Skin: Negative for rash.  Neurological: Positive for weakness. Negative for dizziness, syncope, facial asymmetry, speech difficulty and headaches.  Psychiatric/Behavioral: Negative for confusion.     Physical  Exam Updated Vital Signs BP (!) 149/71 (BP Location: Left Arm)    Pulse 78    Temp 97.8 F (36.6 C) (Oral)    Resp (!) 23    Ht 5\' 11"  (1.803 m)    Wt 68.5 kg    SpO2 94%    BMI 21.06 kg/m   Physical Exam Vitals signs and nursing note reviewed.  Constitutional:      Appearance: Normal appearance.     Comments: Pt is frail appearing, elderly  HENT:     Mouth/Throat:     Mouth: Mucous membranes are dry.  Eyes:     Extraocular Movements: Extraocular movements intact.     Pupils: Pupils are equal, round, and reactive to light.  Neck:     Vascular: No carotid bruit.  Cardiovascular:     Rate and Rhythm: Normal rate. Rhythm irregular.  Pulses: Normal pulses.  Pulmonary:     Effort: Pulmonary effort is normal. No respiratory distress.     Breath sounds: Normal breath sounds. No wheezing.  Abdominal:     Palpations: Abdomen is soft. There is no mass.     Tenderness: There is no abdominal tenderness. There is no guarding.  Musculoskeletal: Normal range of motion.     Right lower leg: No edema.     Left lower leg: No edema.  Lymphadenopathy:     Cervical: No cervical adenopathy.  Skin:    General: Skin is warm.     Capillary Refill: Capillary refill takes less than 2 seconds.     Findings: No rash.  Neurological:     General: No focal deficit present.     Mental Status: He is alert.     GCS: GCS eye subscore is 4. GCS verbal subscore is 5. GCS motor subscore is 6.     Sensory: Sensation is intact. No sensory deficit.     Motor: No weakness, tremor or pronator drift.     Coordination: Coordination is intact.     Comments: CN II-XII grossly intact.  Speech clear.  mentating well      ED Treatments / Results  Labs (all labs ordered are listed, but only abnormal results are displayed) Labs Reviewed  BASIC METABOLIC PANEL - Abnormal; Notable for the following components:      Result Value   Glucose, Bld 126 (*)    GFR calc non Af Amer 54 (*)    All other components within  normal limits  CBC WITH DIFFERENTIAL/PLATELET - Abnormal; Notable for the following components:   RBC 3.88 (*)    Hemoglobin 10.8 (*)    HCT 34.8 (*)    RDW 17.1 (*)    All other components within normal limits  URINALYSIS, ROUTINE W REFLEX MICROSCOPIC - Abnormal; Notable for the following components:   Hgb urine dipstick SMALL (*)    All other components within normal limits  PROTIME-INR - Abnormal; Notable for the following components:   Prothrombin Time 26.1 (*)    INR 2.4 (*)    All other components within normal limits  BRAIN NATRIURETIC PEPTIDE - Abnormal; Notable for the following components:   B Natriuretic Peptide 225.0 (*)    All other components within normal limits  TROPONIN I    EKG EKG Interpretation  Date/Time:  Monday February 23 2019 16:59:01 EDT Ventricular Rate:  83 PR Interval:    QRS Duration: 125 QT Interval:  371 QTC Calculation: 436 R Axis:   -44 Text Interpretation:  Atrial fibrillation IVCD, consider atypical RBBB Inferior infarct, old Lateral leads are also involved no significant change since earlier in the day Confirmed by Sherwood Gambler [phone]) on 02/23/2019 5:05:36 PM   Radiology Dg Chest 2 View  Result Date: 02/23/2019 CLINICAL DATA:  85 year old male with weakness and inability to walk EXAM: CHEST - 2 VIEW COMPARISON:  08/24/2017, CT 08/13/2018 FINDINGS: Cardiomediastinal silhouette unchanged in size and contour. Surgical changes of median sternotomy and CABG. Reticular pattern of opacity of the bilateral lungs, worsening since the prior. Interlobular septal thickening. Opacities at the lung bases with blunting of the bilateral costophrenic angles, worse on the right. Meniscus on the lateral view. IMPRESSION: Congestive heart failure superimposed on interstitial fibrosis, and right greater than left pleural effusion. Surgical changes of median sternotomy and CABG Electronically Signed   By: Corrie Mckusick D.O.   On: 02/23/2019 18:33  Procedures Procedures (including critical care time)  Medications Ordered in ED Medications  furosemide (LASIX) injection 20 mg (has no administration in time range)  sodium chloride 0.9 % bolus 500 mL (0 mLs Intravenous Stopped 02/23/19 1845)     Initial Impression / Assessment and Plan / ED Course  I have reviewed the triage vital signs and the nursing notes.  Pertinent labs & imaging results that were available during my care of the patient were reviewed by me and considered in my medical decision making (see chart for details).         Nursing attempted to ambulate pt.  He was unable to stand w/o assistance and O2 sat dropped to 88% on RA and dropped again upon lying down to 85%, placed on 2 L [place] and sats up to 100%.   Given findings, pt with likely new onset CHF, will give Lasix and consult hospitalist for admit.     2015  Updated pt's daughter by phone on results and admission.    2040  Consulted hospitalist, Dr. Velia Meyer, findings were discussed between Dr. Regenia Skeeter and Dr. Velia Meyer who agrees to admit.     Final Clinical Impressions(s) / ED Diagnoses   Final diagnoses:  Generalized weakness  Acute congestive heart failure, unspecified heart failure type [facility])    ED Discharge Orders    None       Bufford Lope 02/23/19 2103    Sherwood Gambler, MD 02/24/19 0000

## 2019-02-23 NOTE — Progress Notes (Signed)
ANTICOAGULATION CONSULT NOTE - Initial Consult  Pharmacy Consult for  Warfarin dosing Indication: atrial fibrillation  Allergies  Allergen Reactions  . Sulfonamide Derivatives     Patient says his wife says he's allergic to sulfa.    Patient Measurements: Height: 5\' 11"  (180.3 cm) Weight: 151 lb (68.5 kg) IBW/kg (Calculated) : 75.3 Heparin Dosing Weight:    Vital Signs: Temp: 97.8 F (36.6 C) (04/27 1633) Temp Source: Oral (04/27 1633) BP: 154/73 (04/27 2130) Pulse Rate: 78 (04/27 2130)  Labs: Recent Labs    02/23/19 1739  HGB 10.8*  HCT 34.8*  PLT 254  LABPROT 26.1*  INR 2.4*  CREATININE 1.20  TROPONINI <0.03   Estimated Creatinine Clearance: 41.2 mL/min (by C-G formula based on SCr of 1.2 mg/dL).  Medical History: Past Medical History:  Diagnosis Date  . Atrial fibrillation ([place])   . Cancer ([place])   . Coronary atherosclerosis of native coronary artery    Prior stenting of LAD and diagonal at [facility] with subsequent SVG to diagonal, LVEF 55-60%  . Dysphagia   . GERD   . Hiccups   . History of prostate cancer   . Mixed hyperlipidemia   . Weight loss      Assessment: Pharmacy consulted to dose heparin for this 85 yo  male on chronic anti-coagulation with warfarin for atrial fibrillation.  His last dose of warfarin was on 4-26.  CBC is WNL.  Home dose: warfarin 3mg  daily    Goal of Therapy:  INR 2-3 Monitor platelets by anticoagulation protocol: Yes   Plan:   Give warfarin 3mg  x1 dose tonight for INR 2.4 Monitor CBC and INR    Despina Pole 02/23/2019,10:22 PM

## 2019-02-23 NOTE — ED Triage Notes (Signed)
Pt with worsening weakness and inability to walk. Denies pain. Pt with multiple falls

## 2019-02-24 ENCOUNTER — Inpatient Hospital Stay (HOSPITAL_COMMUNITY): Payer: Medicare Other

## 2019-02-24 DIAGNOSIS — I5031 Acute diastolic (congestive) heart failure: Secondary | ICD-10-CM | POA: Diagnosis present

## 2019-02-24 DIAGNOSIS — I361 Nonrheumatic tricuspid (valve) insufficiency: Secondary | ICD-10-CM

## 2019-02-24 DIAGNOSIS — I351 Nonrheumatic aortic (valve) insufficiency: Secondary | ICD-10-CM

## 2019-02-24 DIAGNOSIS — I4821 Permanent atrial fibrillation: Secondary | ICD-10-CM

## 2019-02-24 DIAGNOSIS — R627 Adult failure to thrive: Secondary | ICD-10-CM

## 2019-02-24 LAB — BASIC METABOLIC PANEL
Anion gap: 9 (ref 5–15)
BUN: 20 mg/dL (ref 8–23)
CO2: 25 mmol/L (ref 22–32)
Calcium: 9 mg/dL (ref 8.9–10.3)
Chloride: 104 mmol/L (ref 98–111)
Creatinine, Ser: 1.29 mg/dL — ABNORMAL HIGH (ref 0.61–1.24)
GFR calc Af Amer: 57 mL/min — ABNORMAL LOW (ref >60)
GFR calc non Af Amer: 49 mL/min — ABNORMAL LOW (ref >60)
Glucose, Bld: 107 mg/dL — ABNORMAL HIGH (ref 70–99)
Potassium: 4.7 mmol/L (ref 3.5–5.1)
Sodium: 138 mmol/L (ref 135–145)

## 2019-02-24 LAB — CBC
HCT: 36.4 % — ABNORMAL LOW (ref 39.0–52.0)
Hemoglobin: 10.8 g/dL — ABNORMAL LOW (ref 13.0–17.0)
MCH: 27 pg (ref 26.0–34.0)
MCHC: 29.7 g/dL — ABNORMAL LOW (ref 30.0–36.0)
MCV: 91 fL (ref 80.0–100.0)
Platelets: 267 10*3/uL (ref 150–400)
RBC: 4 MIL/uL — ABNORMAL LOW (ref 4.22–5.81)
RDW: 17 % — ABNORMAL HIGH (ref 11.5–15.5)
WBC: 7.5 10*3/uL (ref 4.0–10.5)
nRBC: 0 % (ref 0.0–0.2)

## 2019-02-24 LAB — MAGNESIUM: Magnesium: 1.7 mg/dL (ref 1.7–2.4)

## 2019-02-24 LAB — DIGOXIN LEVEL: Digoxin Level: 0.7 ng/mL — ABNORMAL LOW (ref 0.8–2.0)

## 2019-02-24 LAB — TSH: TSH: 1.106 u[IU]/mL (ref 0.350–4.500)

## 2019-02-24 LAB — PROTIME-INR
INR: 2.4 — ABNORMAL HIGH (ref 0.8–1.2)
Prothrombin Time: 25.9 seconds — ABNORMAL HIGH (ref 11.4–15.2)

## 2019-02-24 LAB — ECHOCARDIOGRAM COMPLETE
Height: 71 in
Weight: 2416 oz

## 2019-02-24 MED ORDER — ADULT MULTIVITAMIN W/MINERALS CH
1.0000 | ORAL_TABLET | Freq: Every day | ORAL | Status: DC
  Administered 2019-02-24 – 2019-02-26 (×3): 1 via ORAL
  Filled 2019-02-24 (×3): qty 1

## 2019-02-24 MED ORDER — WARFARIN SODIUM 2 MG PO TABS
3.0000 mg | ORAL_TABLET | Freq: Once | ORAL | Status: AC
  Administered 2019-02-24: 3 mg via ORAL
  Filled 2019-02-24: qty 1

## 2019-02-24 MED ORDER — AMOXICILLIN 250 MG PO CAPS
500.0000 mg | ORAL_CAPSULE | Freq: Three times a day (TID) | ORAL | Status: DC
  Administered 2019-02-24 – 2019-02-26 (×8): 500 mg via ORAL
  Filled 2019-02-24 (×8): qty 2

## 2019-02-24 MED ORDER — PERFLUTREN LIPID MICROSPHERE
1.0000 mL | INTRAVENOUS | Status: AC | PRN
  Administered 2019-02-24: 13:00:00 2 mL via INTRAVENOUS
  Administered 2019-02-24: 13:00:00 1 mL via INTRAVENOUS
  Filled 2019-02-24: qty 10

## 2019-02-24 NOTE — Consult Note (Signed)
Cardiology Consult    Patient ID: DEWEY VIENS; [id number]; 1928-05-03   Admit date: 02/23/2019 Date of Consult: 02/24/2019  Primary Care Provider: Sinda Du, MD Primary Cardiologist: Rozann Lesches, MD   Patient Profile    Sergio Boyle is a 85 y.o. male with past medical history of CAD (s/p prior intervention to LAD and D1 with SVG-D1 in the past performed at [facility]), chronic diastolic CHF, permanent atrial fibrillation (on Coumadin), HTN, and HLD who is being seen today for the evaluation of CHF at the request of Dr. Luan Pulling.   History of Present Illness    Sergio Boyle was last examined by Dr. Domenic Polite in 03/2018 and reported being mostly sedentary at that time and not overly active at baseline. Weight was stable at 160 lbs and he was continued on his current medication regimen.   He presented to [facility] ED on 02/23/2019 for evaluation of worsening weakness and multiple falls. Lives with wife and several family members but has been requiring the assistance of two family members for ADL's. Family reported he had recently been treated for a UTI by his PCP with minimal improvement in his symptoms. He reported progressive lower extremity weakness but denied any associated chest pain, dyspnea, palpitations, nausea, vomiting, or orthopnea.   Initial labs showed WBC 6.9, Hgb 10.8, platelets 254, Na+ 137, K+ 5.0, and creatinine 1.20. UA unrevealing. Initial troponin negative. Mg 1.7. BNP 225. Mg 1.7. TSH 1.106. Dig level 0.7.  CXR showing CHF superimposed on interstitial fibrosis and bilateral pleural effusions. EKG shows atrial flutter, HR 86, with known RBBB.   He has been admitted for an acute CHF exacerbation and started on IV Lasix 20mg  BID with a recorded net output of -1.0L thus far. Weight at time of admission was 151 lbs, not yet obtained for today.   Past Medical History:  Diagnosis Date  . Atrial fibrillation ([place])   . Cancer ([place])   . Coronary atherosclerosis of native  coronary artery    Prior stenting of LAD and diagonal at [facility] with subsequent SVG to diagonal, LVEF 55-60%  . Dysphagia   . GERD   . Hiccups   . History of prostate cancer   . Mixed hyperlipidemia   . Weight loss     Past Surgical History:  Procedure Laterality Date  . Bilateral inguinal hernia repair    . bulging disc    . COLONOSCOPY  06/2010  . CORONARY ARTERY BYPASS GRAFT  1996    Emergent SVG to diagonal at [facility]  . ESOPHAGEAL DILATION N/A 06/10/2015   Procedure: ESOPHAGEAL DILATION;  Surgeon: Rogene Houston, MD;  Location: [facility] ENDO SUITE;  Service: Endoscopy;  Laterality: N/A;  . ESOPHAGOGASTRODUODENOSCOPY     w/esophageal dilation   . ESOPHAGOGASTRODUODENOSCOPY N/A 06/10/2015   Procedure: ESOPHAGOGASTRODUODENOSCOPY (EGD);  Surgeon: Rogene Houston, MD;  Location: [facility] ENDO SUITE;  Service: Endoscopy;  Laterality: N/A;  245  . IR KYPHO LUMBAR INC FX REDUCE BONE BX UNI/BIL CANNULATION INC/IMAGING  03/07/2017  . IR RADIOLOGIST EVAL & MGMT  02/28/2017  . UPPER GASTROINTESTINAL ENDOSCOPY  06/2010     Home Medications:  Prior to Admission medications   Medication Sig Start Date End Date Taking? Authorizing Provider  ALPRAZolam (XANAX) 0.5 MG tablet TAKE ONE TABLET BY MOUTH TWICE A DAY Patient taking differently: Take 0.25 mg by mouth 2 (two) times daily. *May take additional dose as needed for anxiety 04/17/18  Yes Rehman, Mechele Dawley, MD  atorvastatin (LIPITOR)  20 MG tablet Take 20 mg by mouth at bedtime.    Yes [provider]  cephALEXin (KEFLEX) 500 MG capsule Take 500 mg by mouth 3 (three) times daily. 10 day course starting on 02/16/2019 02/16/19  Yes [provider]  cholecalciferol (VITAMIN D) 1000 UNITS tablet Take 1,000 Units by mouth daily.   Yes [provider]  cyanocobalamin 500 MCG tablet Take 500 mcg by mouth daily.   Yes [provider]  digoxin (LANOXIN) 0.125 MG tablet TAKE ONE-HALF TABLET BY MOUTH ONCE A DAY Patient taking differently:  Take 0.0625 mg by mouth daily.  08/25/18  Yes Sergio Sark, MD  guaiFENesin (MUCINEX) 600 MG 12 hr tablet Take 300 mg by mouth 3 (three) times daily.    Yes [provider]  magnesium oxide (MAG-OX 400) 400 MG tablet Take 1 tablet (400 mg total) by mouth every 7 (seven) days. 01/13/13  Yes Rehman, Mechele Dawley, MD  nitroGLYCERIN (NITROSTAT) 0.4 MG SL tablet Place 1 tablet (0.4 mg total) under the tongue every 5 (five) minutes as needed. 03/31/18  Yes Sergio Sark, MD  oxyCODONE-acetaminophen (PERCOCET/ROXICET) 5-325 MG tablet Take 1 tablet by mouth every 4 (four) hours as needed. Patient taking differently: Take 1 tablet by mouth every 4 (four) hours as needed for moderate pain.  10/10/15  Yes Tanna Furry, MD  pantoprazole (PROTONIX) 40 MG tablet TAKE 1 TABLET BY MOUTH EVERY DAY BEFORE BREAKFAST Patient taking differently: Take 40 mg by mouth daily before breakfast.  11/17/18  Yes Rehman, Mechele Dawley, MD  Propylene Glycol (SYSTANE BALANCE OP) Place 1-2 drops into both eyes daily as needed (for dry eye relief).    Yes [provider]  warfarin (COUMADIN) 3 MG tablet Take 3 mg by mouth every evening.  03/09/11  Yes [provider]  amoxicillin (AMOXIL) 500 MG capsule Take 500 mg by mouth 3 (three) times daily. 10 day course starting on 02/23/2019 02/23/19   [provider]    Inpatient Medications: Scheduled Meds: . ALPRAZolam  0.25 mg Oral BID  . amoxicillin  500 mg Oral Q8H  . atorvastatin  20 mg Oral QHS  . digoxin  0.0625 mg Oral Daily  . feeding supplement (ENSURE ENLIVE)  237 mL Oral BID BM  . furosemide  20 mg Intravenous BID  . pantoprazole  40 mg Oral QAC breakfast  . warfarin  3 mg Oral Once  . Warfarin - Pharmacist Dosing Inpatient   Does not apply q1800   Continuous Infusions:  PRN Meds: acetaminophen **OR** acetaminophen  Allergies:    Allergies  Allergen Reactions  . Sulfonamide Derivatives     Patient says his wife says he's allergic to  sulfa.    Social History:   Social History   Socioeconomic History  . Marital status: Married    Spouse name: Not on file  . Number of children: Not on file  . Years of education: Not on file  . Highest education level: Not on file  Occupational History  . Not on file  Social Needs  . Financial resource strain: Patient refused  . Food insecurity:    Worry: Patient refused    Inability: Patient refused  . Transportation needs:    Medical: Patient refused    Non-medical: Patient refused  Tobacco Use  . Smoking status: Never Smoker  . Smokeless tobacco: Never Used  Substance and Sexual Activity  . Alcohol use: No    Alcohol/week: 0.0 standard drinks  . Drug  use: No  . Sexual activity: Not on file  Lifestyle  . Physical activity:    Days per week: Patient refused    Minutes per session: Patient refused  . Stress: Patient refused  Relationships  . Social connections:    Talks on phone: Patient refused    Gets together: Patient refused    Attends religious service: Patient refused    Active member of club or organization: Patient refused    Attends meetings of clubs or organizations: Patient refused    Relationship status: Patient refused  . Intimate partner violence:    Fear of current or ex partner: Patient refused    Emotionally abused: Patient refused    Physically abused: Patient refused    Forced sexual activity: Patient refused  Other Topics Concern  . Not on file  Social History Narrative   [name]. Has 1 child from 1st marriage and 2 step children from second.     Family History:    Family History  Problem Relation Age of Onset  . Coronary artery disease Father       Review of Systems    General:  No chills, fever, night sweats or weight changes. Positive for progressive weakness and frequent falls.  Cardiovascular:  No chest pain, dyspnea on exertion, orthopnea, palpitations, paroxysmal nocturnal dyspnea. Positive for edema.   Dermatological: No rash, lesions/masses Respiratory: No cough, dyspnea Urologic: No hematuria, dysuria Abdominal:   No nausea, vomiting, diarrhea, bright red blood per rectum, melena, or hematemesis Neurologic:  No visual changes, changes in mental status. All other systems reviewed and are otherwise negative except as noted above.  Physical Exam/Data    Vitals:   02/23/19 2130 02/23/19 2200 02/23/19 2255 02/24/19 0525  BP: (!) 154/73 (!) 154/78 (!) 158/66 139/74  Pulse: 78 78 79 77  Resp: (!) 23 (!) 28 16 16   Temp:   97.9 F (36.6 C) 98 F (36.7 C)  TempSrc:   Oral Oral  SpO2: 100% 100% 99% 100%  Weight:      Height:        Intake/Output Summary (Last 24 hours) at 02/24/2019 0925 Last data filed at 02/24/2019 0800 Gross per 24 hour  Intake 240 ml  Output 1300 ml  Net -1060 ml   Filed Weights   02/23/19 1631  Weight: 68.5 kg   Body mass index is 21.06 kg/m.   General: Frail, chronically ill appearing elderly male in NAD Psych: Flat affect. Neuro: Alert and oriented X 3. Moves all extremities spontaneously but slowly. HEENT: Normal  Neck: Supple without bruits or JVD. Lungs: Crackles and rhonchi bilaterally. Heart: Irregularly irregular, no s3, s4, or murmurs. Abdomen: Soft, non-tender, non-distended, BS + x 4.  Extremities: No clubbing or cyanosis.  Minor ankle edema bilaterally. DP/PT/Radials 1-2+ and equal bilaterally.   Labs/Studies     Relevant CV Studies:  Echocardiogram: 04/2012 Study Conclusions  - Left ventricle: The cavity size was normal. Wall thickness was increased in a pattern of mild LVH. There was focal basal hypertrophy. Systolic function was normal. The estimated ejection fraction was in the range of 55% to 60%. - Aortic valve: Mild regurgitation. - Left atrium: The atrium was moderately dilated. - Right atrium: The atrium was moderately dilated.  Laboratory Data:  Chemistry Recent Labs  Lab 02/23/19 1739 02/24/19 0510   NA 137 138  K 5.0 4.7  CL 104 104  CO2 24 25  GLUCOSE 126* 107*  BUN 23 20  CREATININE  1.20 1.29*  CALCIUM 8.9 9.0  GFRNONAA 54* 49*  GFRAA >60 57*  ANIONGAP 9 9    No results for input(s): PROT, ALBUMIN, AST, ALT, ALKPHOS, BILITOT in the last 168 hours. Hematology Recent Labs  Lab 02/23/19 1739 02/24/19 0510  WBC 6.9 7.5  RBC 3.88* 4.00*  HGB 10.8* 10.8*  HCT 34.8* 36.4*  MCV 89.7 91.0  MCH 27.8 27.0  MCHC 31.0 29.7*  RDW 17.1* 17.0*  PLT 254 267   Cardiac Enzymes Recent Labs  Lab 02/23/19 1739  TROPONINI <0.03   No results for input(s): TROPIPOC in the last 168 hours.  BNP Recent Labs  Lab 02/23/19 1932  BNP 225.0*    DDimer No results for input(s): DDIMER in the last 168 hours.  Radiology/Studies:  Dg Chest 2 View  Result Date: 02/23/2019 CLINICAL DATA:  85 year old male with weakness and inability to walk EXAM: CHEST - 2 VIEW COMPARISON:  08/24/2017, CT 08/13/2018 FINDINGS: Cardiomediastinal silhouette unchanged in size and contour. Surgical changes of median sternotomy and CABG. Reticular pattern of opacity of the bilateral lungs, worsening since the prior. Interlobular septal thickening. Opacities at the lung bases with blunting of the bilateral costophrenic angles, worse on the right. Meniscus on the lateral view. IMPRESSION: Congestive heart failure superimposed on interstitial fibrosis, and right greater than left pleural effusion. Surgical changes of median sternotomy and CABG Electronically Signed   By: Corrie Mckusick D.O.   On: 02/23/2019 18:33     Assessment & Plan    1.  Increasing weakness and falls with failure to thrive.  Component of congestive heart failure is also suspected. - presented with progressive weakness and falls in the setting of a UTI but also reported worsening leg edema and found to have a CHF exacerbation with BNP elevated to 225 and CXR showing bilateral pleural effusions.  - would agree with obtaining an echocardiogram as his  last study was approximately 7 years ago. If found to have a reduced EF, would anticipate medical management given his current state.  - he was started on IV Lasix 20mg  BID with a recorded net output of -1.0L thus far. Creatinine has trended up slightly from 1.20 to 1.29. Given his response, could likely continue at current dosing for now and adjust to 40mg  BID tomorrow pending renal function and assessment of fluid status by examination.    2. CAD - s/p prior intervention to LAD and D1 with SVG-D1 in the past performed at [facility]. He denies any recent chest pain. Initial troponin negative and EKG without acute ischemic changes this admission.  - not on ASA given the need for anticoagulation. Continue statin therapy.   3. Permanent Atrial Fibrillation - rates have overall been well-controlled this admission. Has been continued on PTA Digoxin with Dig Level at 0.7.  - denies any evidence of active bleeding. INR therapeutic at 2.4 today.   4. HTN - BP has been variable at 113/66 - 161/86 since admission. At 139/74 on most recent check. Has been continued on PTA Digoxin.  5. Enterococcus UTI - has been switched from Keflex to Amoxicillin.  - per admitting team.    For questions or updates, please contact [place] Please consult [email or web address] for contact info under Cardiology/STEMI.  Signed, Erma Heritage, PA-C 02/24/2019, 9:25 AM Pager: [phone]   Attending note:  Patient seen and examined.  His history is known to me.  I agree with above documentation by Ms. Strader PA-C, the physical examination is my own documentation.  Sergio Boyle is admitted with failure to thrive, worsening weakness and ability to ambulate, requiring increasing assistance with ADLs by family members at home.  He has also had frequent falls.  On examination this morning he appears weak and frail but in no distress.  He does not report any chest pain or palpitations.  Systolic blood pressures in the 130-150  range, heart rate in the 70s to 80s in atrial fibrillation by telemetry which I personally reviewed.  He has only trace ankle edema this morning.  Lab work shows potassium 4.7, BUN 20, creatinine 1.29, BNP 225, negative troponin I, hemoglobin 10.8, platelets 267.  Chest x-ray reports interstitial fibrosis with associated edema and right greater than left pleural effusions.  I personally reviewed his ECG which shows atrial fibrillation with right bundle branch block and left anterior fascicular block.  Plan at this time is to obtain a follow-up echocardiogram to reassess cardiac structure and function.  Previously in 2013 his LVEF was in the range of 55 to 60%.  He does have evidence of fluid overload by chest x-ray.  Continue IV Lasix with follow-up urine output and BMET in a.m.  We will go ahead and stop Lanoxin for now.  If we need additional rate control this can be adjusted as needed.  Continue Coumadin for stroke prophylaxis with management by pharmacy.  I would anticipate overall medical management of his cardiac status.  Sergio Boyle, M.D., F.A.C.C.

## 2019-02-24 NOTE — Progress Notes (Signed)
ANTICOAGULATION CONSULT NOTE -  Pharmacy Consult for  Warfarin dosing Indication: atrial fibrillation  Allergies  Allergen Reactions  . Sulfonamide Derivatives     Patient says his wife says he's allergic to sulfa.    Patient Measurements: Height: 5\' 11"  (180.3 cm) Weight: 151 lb (68.5 kg) IBW/kg (Calculated) : 75.3 Heparin Dosing Weight:    Vital Signs: Temp: 98 F (36.7 C) (04/28 0525) Temp Source: Oral (04/28 0525) BP: 139/74 (04/28 0525) Pulse Rate: 77 (04/28 0525)  Labs: Recent Labs    02/23/19 1739 02/24/19 0510  HGB 10.8* 10.8*  HCT 34.8* 36.4*  PLT 254 267  LABPROT 26.1* 25.9*  INR 2.4* 2.4*  CREATININE 1.20 1.29*  TROPONINI <0.03  --    Estimated Creatinine Clearance: 38.4 mL/min (A) (by C-G formula based on SCr of 1.29 mg/dL (H)).  Medical History: Past Medical History:  Diagnosis Date  . Atrial fibrillation ([place])   . Cancer ([place])   . Coronary atherosclerosis of native coronary artery    Prior stenting of LAD and diagonal at [facility] with subsequent SVG to diagonal, LVEF 55-60%  . Dysphagia   . GERD   . Hiccups   . History of prostate cancer   . Mixed hyperlipidemia   . Weight loss      Assessment: Pharmacy consulted to dose heparin for this 85 yo  male on chronic anti-coagulation with warfarin for atrial fibrillation.  CBC is WNL.  Home dose: warfarin 3mg  daily    Goal of Therapy:  INR 2-3 Monitor platelets by anticoagulation protocol: Yes   Plan:  Give warfarin 3mg  x1 dose  Monitor CBC and INR  Margot Ables, PharmD Clinical Pharmacist 02/24/2019 7:48 AM

## 2019-02-24 NOTE — TOC Initial Note (Signed)
Transition of Care [facility]) - Initial/Assessment Note    Patient Details  Name: Sergio Boyle MRN: [id number] Date of Birth: [birth year]  [facility]) CM/SW Contact:    Sergio Lucks, RN Phone Number: 02/24/2019, 12:26 PM  Clinical Narrative:    Patient admitted for weakness. [facility] has been providing PT for patient at home. Patient gave CM permission to speak with daughter, Sergio Boyle. Per Sergio Boyle she and her husband move in with her father and mother to care for them. States even with PT, her father has "good days and bad days".  PT recommends SNF. Patient and family in agreement, asking for [facility] or [facility] due to location. They need to continue to take care of mother in the home, so needing facility in [place] to care for her father.           Expected Discharge Plan: [place]     Patient Goals and CMS Choice Patient states their goals for this hospitalization and ongoing recovery are:: Patient wishing to go to SNF for short term then home. Patient's daughter and son-in-Law moved back home to care for him. CMS Medicare.gov Compare Post Acute Care list provided to:: Patient Represenative (must comment)(Sergio Boyle - POA- daughter) Choice offered to / list presented to : Patient, Adult Children  Expected Discharge Plan and Services Expected Discharge Plan: [place]   Discharge Planning Services: CM Consult Post Acute Care Choice: [place] Living arrangements for the past 2 months: Single Family Home                                      Prior Living Arrangements/Services Living arrangements for the past 2 months: Single Family Home Lives with:: Adult Children, Spouse Patient language and need for interpreter reviewed:: No Do you feel safe going back to the place where you live?: Yes(After rehab)      Need for Family Participation in Patient Care: No (Comment) Care giver support system in place?:  Yes (comment) Current home services: Home PT    Activities of Daily Living Home Assistive Devices/Equipment: Wheelchair, Environmental consultant (specify type), Eyeglasses, Hearing aid, Bedside commode/3-in-1, Shower chair without back ADL Screening (condition at time of admission) Patient's cognitive ability adequate to safely complete daily activities?: Yes Is the patient deaf or have difficulty hearing?: Yes Does the patient have difficulty seeing, even when wearing glasses/contacts?: Yes Does the patient have difficulty concentrating, remembering, or making decisions?: Yes Patient able to express need for assistance with ADLs?: Yes Does the patient have difficulty dressing or bathing?: Yes Independently performs ADLs?: No Dressing (OT): Needs assistance Is this a change from baseline?: Pre-admission baseline Grooming: Needs assistance Is this a change from baseline?: Pre-admission baseline Is this a change from baseline?: Pre-admission baseline Is this a change from baseline?: Pre-admission baseline In/Out Bed: Needs assistance Is this a change from baseline?: Pre-admission baseline Walks in Home: Needs assistance Is this a change from baseline?: Pre-admission baseline Does the patient have difficulty walking or climbing stairs?: Yes Weakness of Legs: Both Weakness of Arms/Hands: Both  Permission Sought/Granted Permission sought to share information with : Case Manager, Family Supports Permission granted to share information with : Yes, Verbal Permission Granted     Permission granted to share info w AGENCY: [facility] [place] granted to share info w Contact Information: Sergio Boyle -  daughter- POA  Emotional Assessment Appearance:: Appears stated age   Affect (typically observed): Accepting, Pleasant, Calm, Quiet Orientation: : Oriented to Self, Oriented to  Time, Oriented to Place, Oriented to Situation Alcohol / Substance Use: Not Applicable Psych  Involvement: No (comment)  Admission diagnosis:  Generalized weakness [R53.1] Acute congestive heart failure, unspecified heart failure type [facility]) [I50.9] Patient Active Problem List   Diagnosis Date Noted  . Acute diastolic CHF (congestive heart failure) ([place]) 02/24/2019  . Generalized weakness 02/23/2019  . LBP (low back pain) 01/27/2014  . Chronic neck pain 01/27/2014  . Esophageal motility disorder 12/25/2011  . Coronary atherosclerosis of native coronary artery 05/09/2010  . Mixed hyperlipidemia 07/20/2009  . ATRIAL FIBRILLATION, CHRONIC 07/20/2009  . GERD 07/20/2009   PCP:  Sergio Du, MD Pharmacy:   [place], [place] [address] Phone: [phone] Fax: [phone]     Readmission Risk Interventions No flowsheet data found.

## 2019-02-24 NOTE — NC FL2 (Signed)
[place] LEVEL OF CARE SCREENING TOOL     IDENTIFICATION  Patient Name: Sergio Boyle Birthdate: [birth year] Sex: male Admission Date (Current Location): 02/23/2019  [facility] and [place] Number:  [employer] and Address:  [place] [address], [place]      Provider Number: [id number]  Attending Physician Name and Address:  Sinda Du, MD  Relative Name and Phone Number:  Elby Showers - Daughter [phone]    Current Level of Care: Hospital Recommended Level of Care: [place] Prior Approval Number:    Date Approved/Denied:   PASRR Number: [id number]  Discharge Plan: SNF    Current Diagnoses: Patient Active Problem List   Diagnosis Date Noted  . Acute diastolic CHF (congestive heart failure) ([place]) 02/24/2019  . Generalized weakness 02/23/2019  . LBP (low back pain) 01/27/2014  . Chronic neck pain 01/27/2014  . Esophageal motility disorder 12/25/2011  . Coronary atherosclerosis of native coronary artery 05/09/2010  . Mixed hyperlipidemia 07/20/2009  . ATRIAL FIBRILLATION, CHRONIC 07/20/2009  . GERD 07/20/2009    Orientation RESPIRATION BLADDER Height & Weight     Self, Time, Situation, Place  Normal Indwelling catheter Weight: 68.5 kg Height:  5\' 11"  (180.3 cm)  BEHAVIORAL SYMPTOMS/MOOD NEUROLOGICAL BOWEL NUTRITION STATUS      Continent Diet(dysphagia 1)  AMBULATORY STATUS COMMUNICATION OF NEEDS Skin   Extensive Assist Verbally Bruising(left buttock)                       Personal Care Assistance Level of Assistance  Bathing, Feeding, Dressing Bathing Assistance: Limited assistance Feeding assistance: Limited assistance Dressing Assistance: Limited assistance     Functional Limitations Info  Sight, Hearing, Speech Sight Info: Adequate Hearing Info: Impaired Speech Info: Adequate    SPECIAL CARE FACTORS FREQUENCY  PT (By licensed PT)     PT Frequency: PT 5 times a  week              Contractures Contractures Info: Not present    Additional Factors Info  Code Status, Allergies Code Status Info: DNR Allergies Info: Sulfonamide           Current Medications (02/24/2019):  This is the current hospital active medication list Current Facility-Administered Medications  Medication Dose Route Frequency Provider Last Rate Last Dose  . acetaminophen (TYLENOL) tablet 650 mg  650 mg Oral Q6H PRN Howerter, Justin B, DO       Or  . acetaminophen (TYLENOL) suppository 650 mg  650 mg Rectal Q6H PRN Howerter, Justin B, DO      . ALPRAZolam Duanne Moron) tablet 0.25 mg  0.25 mg Oral BID Howerter, Justin B, DO   0.25 mg at 02/24/19 3664  . amoxicillin (AMOXIL) capsule 500 mg  500 mg Oral Q8H Sinda Du, MD   500 mg at 02/24/19 4034  . atorvastatin (LIPITOR) tablet 20 mg  20 mg Oral QHS Howerter, Justin B, DO   20 mg at 02/23/19 2343  . feeding supplement (ENSURE ENLIVE) (ENSURE ENLIVE) liquid 237 mL  237 mL Oral BID BM Howerter, Justin B, DO   237 mL at 02/24/19 0828  . furosemide (LASIX) injection 20 mg  20 mg Intravenous BID Howerter, Justin B, DO   20 mg at 02/24/19 0823  . pantoprazole (PROTONIX) EC tablet 40 mg  40 mg Oral QAC breakfast Howerter, Justin B, DO   40 mg at 02/24/19 0823  . warfarin (COUMADIN) tablet 3 mg  3 mg Oral Once Sinda Du, MD      . Warfarin - Pharmacist Dosing Inpatient   Does not apply q1800 Howerter, Ethelda Chick, DO         Discharge Medications: Please see discharge summary for a list of discharge medications.  Relevant Imaging Results:  Relevant Lab Results:   Additional Information    Boneta Lucks, RN

## 2019-02-24 NOTE — Progress Notes (Signed)
Subjective: He was admitted yesterday with severe weakness and has what appears to be new onset CHF.  I had several video visits with the patient and his family last week and had a video visit yesterday and they said that he was getting weaker and weaker and they were having difficulty trying to get him in and out of bed to the bathroom etc.  He was directed to go to the emergency department.  He has had a urinary tract infection and initially was started on Keflex but when the urine culture came back it showed enterococcus so he is being switched to amoxicillin.  This morning he says he feels a little better.  Objective: Vital signs in last 24 hours: Temp:  [97.8 F (36.6 C)-98 F (36.7 C)] 98 F (36.7 C) (04/28 0525) Pulse Rate:  [77-88] 77 (04/28 0525) Resp:  [16-28] 16 (04/28 0525) BP: (113-161)/(66-86) 139/74 (04/28 0525) SpO2:  [89 %-100 %] 100 % (04/28 0525) Weight:  [68.5 kg] 68.5 kg (04/27 1631) Weight change:  Last BM Date: 02/22/19  Intake/Output from previous day: 04/27 0701 - 04/28 0700 In: 120 [P.O.:120] Out: 1300 [Urine:1300]  PHYSICAL EXAM General appearance: alert, cooperative, mild distress and Very weak Resp: He has bilateral rales Cardio: irregularly irregular rhythm GI: soft, non-tender; bowel sounds normal; no masses,  no organomegaly Extremities: 1+ edema  Lab Results:  Results for orders placed or performed during the hospital encounter of 02/23/19 (from the past 48 hour(s))  Urinalysis, Routine w reflex microscopic     Status: Abnormal   Collection Time: 02/23/19  5:01 PM  Result Value Ref Range   Color, Urine YELLOW YELLOW   APPearance CLEAR CLEAR   Specific Gravity, Urine 1.008 1.005 - 1.030   pH 7.0 5.0 - 8.0   Glucose, UA NEGATIVE NEGATIVE mg/dL   Hgb urine dipstick SMALL (A) NEGATIVE   Bilirubin Urine NEGATIVE NEGATIVE   Ketones, ur NEGATIVE NEGATIVE mg/dL   Protein, ur NEGATIVE NEGATIVE mg/dL   Nitrite NEGATIVE NEGATIVE   Leukocytes,Ua  NEGATIVE NEGATIVE   RBC / HPF 0-5 0 - 5 RBC/hpf   WBC, UA 0-5 0 - 5 WBC/hpf   Bacteria, UA NONE SEEN NONE SEEN    Comment: Performed at [facility], [address], [place], [place] [address]  Basic metabolic panel     Status: Abnormal   Collection Time: 02/23/19  5:39 PM  Result Value Ref Range   Sodium 137 135 - 145 mmol/L   Potassium 5.0 3.5 - 5.1 mmol/L   Chloride 104 98 - 111 mmol/L   CO2 24 22 - 32 mmol/L   Glucose, Bld 126 (H) 70 - 99 mg/dL   BUN 23 8 - 23 mg/dL   Creatinine, Ser 1.20 0.61 - 1.24 mg/dL   Calcium 8.9 8.9 - 10.3 mg/dL   GFR calc non Af Amer 54 (L) >60 mL/min   GFR calc Af Amer >60 >60 mL/min   Anion gap 9 5 - 15    Comment: Performed at [facility], [address], [place], [place] [address]  CBC with Differential     Status: Abnormal   Collection Time: 02/23/19  5:39 PM  Result Value Ref Range   WBC 6.9 4.0 - 10.5 K/uL   RBC 3.88 (L) 4.22 - 5.81 MIL/uL   Hemoglobin 10.8 (L) 13.0 - 17.0 g/dL   HCT 34.8 (L) 39.0 - 52.0 %   MCV 89.7 80.0 - 100.0 fL   MCH 27.8 26.0 - 34.0 pg  MCHC 31.0 30.0 - 36.0 g/dL   RDW 17.1 (H) 11.5 - 15.5 %   Platelets 254 150 - 400 K/uL   nRBC 0.0 0.0 - 0.2 %   Neutrophils Relative % 66 %   Neutro Abs 4.5 1.7 - 7.7 K/uL   Lymphocytes Relative 18 %   Lymphs Abs 1.2 0.7 - 4.0 K/uL   Monocytes Relative 12 %   Monocytes Absolute 0.8 0.1 - 1.0 K/uL   Eosinophils Relative 4 %   Eosinophils Absolute 0.3 0.0 - 0.5 K/uL   Basophils Relative 0 %   Basophils Absolute 0.0 0.0 - 0.1 K/uL   Immature Granulocytes 0 %   Abs Immature Granulocytes 0.02 0.00 - 0.07 K/uL    Comment: Performed at [facility], [address], [place], [place] [address]  Troponin I - Once     Status: None   Collection Time: 02/23/19  5:39 PM  Result Value Ref Range   Troponin I <0.03 <0.03 ng/mL    Comment: Performed at [facility], [address], [place], [place] [address]  Protime-INR     Status: Abnormal   Collection Time: 02/23/19  5:39 PM  Result Value  Ref Range   Prothrombin Time 26.1 (H) 11.4 - 15.2 seconds   INR 2.4 (H) 0.8 - 1.2    Comment: (NOTE) INR goal varies based on device and disease states. Performed at [facility], [address], [place], [place] [address]   Magnesium     Status: None   Collection Time: 02/23/19  5:39 PM  Result Value Ref Range   Magnesium 1.7 1.7 - 2.4 mg/dL    Comment: Performed at [facility], [address], [place], [place] [address]  Brain natriuretic peptide     Status: Abnormal   Collection Time: 02/23/19  7:32 PM  Result Value Ref Range   B Natriuretic Peptide 225.0 (H) 0.0 - 100.0 pg/mL    Comment: Performed at [facility], [address], [place], [place] [address]  Magnesium     Status: None   Collection Time: 02/24/19  5:10 AM  Result Value Ref Range   Magnesium 1.7 1.7 - 2.4 mg/dL    Comment: Performed at [facility], [address], [place], [place] [address]  TSH     Status: None   Collection Time: 02/24/19  5:10 AM  Result Value Ref Range   TSH 1.106 0.350 - 4.500 uIU/mL    Comment: Performed by a 3rd Generation assay with a functional sensitivity of <=0.01 uIU/mL. Performed at [facility], [address], [place], [place] [address]   Basic metabolic panel     Status: Abnormal   Collection Time: 02/24/19  5:10 AM  Result Value Ref Range   Sodium 138 135 - 145 mmol/L   Potassium 4.7 3.5 - 5.1 mmol/L   Chloride 104 98 - 111 mmol/L   CO2 25 22 - 32 mmol/L   Glucose, Bld 107 (H) 70 - 99 mg/dL   BUN 20 8 - 23 mg/dL   Creatinine, Ser 1.29 (H) 0.61 - 1.24 mg/dL   Calcium 9.0 8.9 - 10.3 mg/dL   GFR calc non Af Amer 49 (L) >60 mL/min   GFR calc Af Amer 57 (L) >60 mL/min   Anion gap 9 5 - 15    Comment: Performed at [facility], [address], [place],  [address]  CBC     Status: Abnormal   Collection Time: 02/24/19  5:10 AM  Result Value Ref  Range   WBC 7.5 4.0 - 10.5 K/uL   RBC 4.00 (L) 4.22 - 5.81 MIL/uL   Hemoglobin 10.8 (L) 13.0 - 17.0 g/dL   HCT 36.4 (L) 39.0 -  52.0 %   MCV 91.0 80.0 - 100.0 fL   MCH 27.0 26.0 - 34.0 pg   MCHC 29.7 (L) 30.0 - 36.0 g/dL   RDW 17.0 (H) 11.5 - 15.5 %   Platelets 267 150 - 400 K/uL   nRBC 0.0 0.0 - 0.2 %    Comment: Performed at [facility], [address] [place], [place] [address]  Digoxin level     Status: Abnormal   Collection Time: 02/24/19  5:10 AM  Result Value Ref Range   Digoxin Level 0.7 (L) 0.8 - 2.0 ng/mL    Comment: Performed at [facility], [address] [place], [place] [address]  Protime-INR     Status: Abnormal   Collection Time: 02/24/19  5:10 AM  Result Value Ref Range   Prothrombin Time 25.9 (H) 11.4 - 15.2 seconds   INR 2.4 (H) 0.8 - 1.2    Comment: (NOTE) INR goal varies based on device and disease states. Performed at [facility], [address] [place], [place] [address]     ABGS No results for input(s): PHART, PO2ART, TCO2, HCO3 in the last 72 hours.  Invalid input(s): PCO2 CULTURES No results found for this or any previous visit (from the past 240 hour(s)). Studies/Results: Dg Chest 2 View  Result Date: 02/23/2019 CLINICAL DATA:  85 year old male with weakness and inability to walk EXAM: CHEST - 2 VIEW COMPARISON:  08/24/2017, CT 08/13/2018 FINDINGS: Cardiomediastinal silhouette unchanged in size and contour. Surgical changes of median sternotomy and CABG. Reticular pattern of opacity of the bilateral lungs, worsening since the prior. Interlobular septal thickening. Opacities at the lung bases with blunting of the bilateral costophrenic angles, worse on the right. Meniscus on the lateral view. IMPRESSION: Congestive heart failure superimposed on interstitial fibrosis, and right greater than left pleural effusion. Surgical changes of median sternotomy and CABG Electronically Signed   By: Corrie Mckusick D.O.   On: 02/23/2019 18:33    Medications:  Prior to Admission:  Medications Prior to Admission  Medication Sig Dispense Refill Last Dose  . ALPRAZolam (XANAX) 0.5 MG tablet  TAKE ONE TABLET BY MOUTH TWICE A DAY (Patient taking differently: Take 0.25 mg by mouth 2 (two) times daily. *May take additional dose as needed for anxiety) 60 tablet 1 02/23/2019 at Unknown time  . atorvastatin (LIPITOR) 20 MG tablet Take 20 mg by mouth at bedtime.    02/22/2019 at Unknown time  . cephALEXin (KEFLEX) 500 MG capsule Take 500 mg by mouth 3 (three) times daily. 10 day course starting on 02/16/2019   02/23/2019 at Unknown time  . cholecalciferol (VITAMIN D) 1000 UNITS tablet Take 1,000 Units by mouth daily.   02/23/2019 at Unknown time  . cyanocobalamin 500 MCG tablet Take 500 mcg by mouth daily.   02/23/2019 at Unknown time  . digoxin (LANOXIN) 0.125 MG tablet TAKE ONE-HALF TABLET BY MOUTH ONCE A DAY (Patient taking differently: Take 0.0625 mg by mouth daily. ) 45 tablet 3 02/23/2019 at Unknown time  . guaiFENesin (MUCINEX) 600 MG 12 hr tablet Take 300 mg by mouth 3 (three) times daily.    02/23/2019 at Unknown time  . magnesium oxide (MAG-OX 400) 400 MG tablet Take 1 tablet (400 mg total) by mouth every 7 (seven) days.   02/18/2019 at Unknown  time  . nitroGLYCERIN (NITROSTAT) 0.4 MG SL tablet Place 1 tablet (0.4 mg total) under the tongue every 5 (five) minutes as needed. 25 tablet 3 unknown  . oxyCODONE-acetaminophen (PERCOCET/ROXICET) 5-325 MG tablet Take 1 tablet by mouth every 4 (four) hours as needed. (Patient taking differently: Take 1 tablet by mouth every 4 (four) hours as needed for moderate pain. ) 20 tablet 0 unknown  . pantoprazole (PROTONIX) 40 MG tablet TAKE 1 TABLET BY MOUTH EVERY DAY BEFORE BREAKFAST (Patient taking differently: Take 40 mg by mouth daily before breakfast. ) 30 tablet 5 02/23/2019 at Unknown time  . Propylene Glycol (SYSTANE BALANCE OP) Place 1-2 drops into both eyes daily as needed (for dry eye relief).    Past Month at Unknown time  . warfarin (COUMADIN) 3 MG tablet Take 3 mg by mouth every evening.    02/22/2019 at 1800  . amoxicillin (AMOXIL) 500 MG capsule Take  500 mg by mouth 3 (three) times daily. 10 day course starting on 02/23/2019   Not Taking at Unknown time   Scheduled: . ALPRAZolam  0.25 mg Oral BID  . amoxicillin  500 mg Oral Q8H  . atorvastatin  20 mg Oral QHS  . digoxin  0.0625 mg Oral Daily  . feeding supplement (ENSURE ENLIVE)  237 mL Oral BID BM  . furosemide  20 mg Intravenous BID  . pantoprazole  40 mg Oral QAC breakfast  . warfarin  3 mg Oral Once  . Warfarin - Pharmacist Dosing Inpatient   Does not apply q1800   Continuous:  WFU:[id number] **OR** acetaminophen  Assesment: He has what seems to be acute diastolic heart failure.  Last echocardiogram was in 2013.  BNP only mildly elevated in the 200s.  He has received Lasix and says he feels a little better.  He has chronic atrial fib and is on chronic Coumadin therapy  He has enterococcus urinary tract infection which is being treated  He has chronic low back pain and osteoporosis stable  He has severe generalized weakness and will have physical therapy evaluation Principal Problem:   Acute diastolic CHF (congestive heart failure) (HCC) Active Problems:   Mixed hyperlipidemia   ATRIAL FIBRILLATION, CHRONIC   GERD   Generalized weakness    Plan: I will request cardiology consultation.  I did not order echocardiogram at this point but will leave to cardiology considering the ongoing COVID-19 pandemic and attempts to limit contact    LOS: 1 day   Alonza Bogus 02/24/2019, 8:28 AM

## 2019-02-24 NOTE — Evaluation (Signed)
Physical Therapy Evaluation Patient Details Name: Sergio Boyle MRN: [id number] DOB: 12/25/1927 Today's Date: 02/24/2019   History of Present Illness  Sergio Boyle is a 85 y.o. male with medical history significant for coronary artery disease status post CABG in 1996, hyperlipidemia, chronic atrial fibrillation chronically anticoagulated on warfarin, who is admitted to [facility] on 02/23/2019 with suspected acutely decompensated heart failure after presenting from home to [facility] ED complaining of generalized weakness.    Clinical Impression  Patient requires assistance and increased time for sitting up at bedside due to generalized weakness, limited to a few steps to transfer to chair secondary to frequent buckling of knees and tolerated sitting up to eat breakfast after therapy.  Patient put on room air and SpO2 desaturated to 85% after transferring to chair, put back on 2 LPM and SpO2 increased to 92-93% - RN aware.  Patient will benefit from continued physical therapy in hospital and recommended venue below to increase strength, balance, endurance for safe ADLs and gait.    Follow Up Recommendations SNF    Equipment Recommendations  None recommended by PT    Recommendations for Other Services       Precautions / Restrictions Precautions Precautions: Fall Restrictions Weight Bearing Restrictions: No      Mobility  Bed Mobility Overal bed mobility: Needs Assistance Bed Mobility: Supine to Sit     Supine to sit: Min assist;Mod assist     General bed mobility comments: slow labored movement  Transfers Overall transfer level: Needs assistance Equipment used: Rolling walker (2 wheeled) Transfers: Sit to/from [facility] Sit to Stand: Mod assist Stand pivot transfers: Mod assist       General transfer comment: labored movement, buckling of knees due to weakness  Ambulation/Gait Ambulation/Gait assistance: Max assist;Mod assist Gait Distance (Feet): 5  Feet Assistive device: Rolling walker (2 wheeled) Gait Pattern/deviations: Decreased step length - right;Decreased step length - left;Decreased stride length Gait velocity: slow   General Gait Details: limited to 5-6 slow unsteady steps at bedside due to BLE weakness  Stairs            Wheelchair Mobility    Modified Rankin (Stroke Patients Only)       Balance Overall balance assessment: Needs assistance Sitting-balance support: Feet supported;No upper extremity supported Sitting balance-Leahy Scale: Fair     Standing balance support: Bilateral upper extremity supported;During functional activity Standing balance-Leahy Scale: Poor Standing balance comment: fair/poor using RW                             Pertinent Vitals/Pain Pain Assessment: 0-10 Pain Score: 3  Pain Location: chronic low back Pain Descriptors / Indicators: Aching Pain Intervention(s): Limited activity within patient's tolerance;Monitored during session    Home Living Family/patient expects to be discharged to:: Private residence Living Arrangements: Spouse/significant other;Other relatives Available Help at Discharge: Family Type of Home: House Home Access: Stairs to enter Entrance Stairs-Rails: Left Entrance Stairs-Number of Steps: 6-7 in front, 3 in back Home Layout: One level Home Equipment: Wheelchair - manual;Transport chair;Bedside commode;Shower seat;Walker - 2 wheels;Cane - single point      Prior Function Level of Independence: Needs assistance   Gait / Transfers Assistance Needed: limited to 3-4 steps during assisted transfers, mostly non-ambulatory for last 7 months, stays up in transport chair or wheelchair  ADL's / Homemaking Assistance Needed: assisted by family        Hand Dominance  Extremity/Trunk Assessment   Upper Extremity Assessment Upper Extremity Assessment: Generalized weakness    Lower Extremity Assessment Lower Extremity Assessment:  Generalized weakness    Cervical / Trunk Assessment Cervical / Trunk Assessment: Kyphotic  Communication   Communication: No difficulties  Cognition Arousal/Alertness: Awake/alert Behavior During Therapy: WFL for tasks assessed/performed Overall Cognitive Status: Within Functional Limits for tasks assessed                                        General Comments      Exercises     Assessment/Plan    PT Assessment Patient needs continued PT services  PT Problem List Decreased strength;Decreased activity tolerance;Decreased balance;Decreased mobility       PT Treatment Interventions Therapeutic exercise;Gait training;Stair training;Functional mobility training;Therapeutic activities;Patient/family education    PT Goals (Current goals can be found in the Care Plan section)  Acute Rehab PT Goals Patient Stated Goal: return home after rehab PT Goal Formulation: With patient Time For Goal Achievement: 03/10/19 Potential to Achieve Goals: Good    Frequency Min 3X/week   Barriers to discharge        Co-evaluation               AM-PAC PT "6 Clicks" Mobility  Outcome Measure Help needed turning from your back to your side while in a flat bed without using bedrails?: A Little Help needed moving from lying on your back to sitting on the side of a flat bed without using bedrails?: A Lot Help needed moving to and from a bed to a chair (including a wheelchair)?: A Lot Help needed standing up from a chair using your arms (e.g., wheelchair or bedside chair)?: A Lot Help needed to walk in hospital room?: A Lot Help needed climbing 3-5 steps with a railing? : Total 6 Click Score: 12    End of Session Equipment Utilized During Treatment: Gait belt Activity Tolerance: Patient tolerated treatment well;Patient limited by fatigue Patient left: in chair;with call bell/phone within reach Nurse Communication: Mobility status PT Visit Diagnosis: Unsteadiness on feet  (R26.81);Other abnormalities of gait and mobility (R26.89);Muscle weakness (generalized) (M62.81)    Time: 5638-7564 PT Time Calculation (min) (ACUTE ONLY): 32 min   Charges:   PT Evaluation $PT Eval Moderate Complexity: 1 Mod PT Treatments $Therapeutic Activity: 23-37 mins        9:11 AM, 02/24/19 Lonell Grandchild, MPT Physical Therapist with [facility] 336 [phone] office [phone] mobile phone

## 2019-02-24 NOTE — Progress Notes (Signed)
SLP Cancellation Note  Patient Details Name: Sergio Boyle MRN: [id number] DOB: 16-Aug-1928   Cancelled treatment:       Reason Eval/Treat Not Completed: Patient at procedure or test/unavailable; SLP will check back as schedule permits. Pt had MBSS in 2016 and has h/o of esophageal dysphagia (followed by Dr. Laural Golden).  Thank you,  Genene Churn, [place]    [place] 02/24/2019, 12:34 PM

## 2019-02-24 NOTE — Progress Notes (Signed)
Initial Nutrition Assessment  RD working remotely.    DOCUMENTATION CODES:     INTERVENTION:  Ensure Enlive po BID, each supplement provides 350 kcal and 20 grams of protein   Pureed diet per ST- hx of dysphagia   Multivitamin daily   NUTRITION DIAGNOSIS:   Inadequate oral intake related to dysphagia, acute illness, decreased appetite as evidenced by (50% of breakfast consumed this morning).   GOAL:  Pt to meet >/= 90% of their estimated nutrition needs    MONITOR:   Supplement acceptance, PO intake, Weight trends, Labs, I & O's  REASON FOR ASSESSMENT:   Malnutrition Screening Tool    ASSESSMENT: Patient is an 85 yo male who presents with complaint of weakness. Hisotry of CAD, Chronic CHF, HTN, HLD. Acute exacerbation of CHF and recent UTI.   Attempted to talk to patient by telephone but he is hard of hearing which limited our ability to discuss his nutrition history. Discussed patient with nurse today who says patient likes the Ensure and at home he adds ice cream- which she is going to do for him today. History of swallow problem and is receiving  pureed foods. Meal intake today at breakfast -50% (fair). Feeds himself.   Weight has fluctuated between 69-73 kg the past 3 years.  Patient is at moderate risk for malnutrition given his recent acute infection and currently meeting approximately 50% of est needs.   Intake/Output Summary (Last 24 hours) at 02/24/2019 1424 Last data filed at 02/24/2019 0800 Gross per 24 hour  Intake 240 ml  Output 1300 ml  Net -1060 ml    Medications reviewed and include: Lasix, Protonix, Coumadin, Ensure Enlive  Labs: BMP Latest Ref Rng & Units 02/24/2019 02/23/2019 03/07/2017  Glucose 70 - 99 mg/dL 107(H) 126(H) 107(H)  BUN 8 - 23 mg/dL 20 23 15   Creatinine 0.61 - 1.24 mg/dL 1.29(H) 1.20 1.10  Sodium 135 - 145 mmol/L 138 137 143  Potassium 3.5 - 5.1 mmol/L 4.7 5.0 4.5  Chloride 98 - 111 mmol/L 104 104 104  CO2 22 - 32 mmol/L 25 24 -   Calcium 8.9 - 10.3 mg/dL 9.0 8.9 -     NUTRITION - FOCUSED PHYSICAL EXAM:  Unable to complete Nutrition-Focused physical exam at this time.    Diet Order:   Diet Order            DIET - DYS 1 Room service appropriate? Yes; Fluid consistency: Thin  Diet effective now             EDUCATION NEEDS:   Not appropriate for education at this time(unable to provide over telephone due to hearing impairment) Skin:  Skin Assessment: Reviewed RN Assessment  Last BM:  4/27   Height:   Ht Readings from Last 1 Encounters:  02/23/19 5\' 11"  (1.803 m)    Weight:   Wt Readings from Last 1 Encounters:  02/23/19 68.5 kg    Ideal Body Weight:  78 kg  BMI:  Body mass index is 21.06 kg/m.  Estimated Nutritional Needs:   Kcal:  0277-4128 (28-30 kcal/kg/bw)  Protein:  83-90 (1.2-1.3 gr/kg/bw)  Fluid:  < 2 liters daily   Colman Cater MS,RD,CSG,LDN Office: [phone] Pager: [phone]

## 2019-02-24 NOTE — Progress Notes (Signed)
*  PRELIMINARY RESULTS* Echocardiogram 2D Echocardiogram has been performed with Definity.  Sergio Boyle 02/24/2019, 1:41 PM

## 2019-02-24 NOTE — Plan of Care (Signed)
  Problem: Acute Rehab PT Goals(only PT should resolve) Goal: Pt Will Go Supine/Side To Sit Outcome: Progressing Flowsheets (Taken 02/24/2019 0913) Pt will go Supine/Side to Sit: with min guard assist Goal: Patient Will Transfer Sit To/From Stand Outcome: Progressing Flowsheets (Taken 02/24/2019 0913) Patient will transfer sit to/from stand: with minimal assist Goal: Pt Will Transfer Bed To Chair/Chair To Bed Outcome: Progressing Flowsheets (Taken 02/24/2019 0913) Pt will Transfer Bed to Chair/Chair to Bed: with min assist Goal: Pt Will Ambulate Outcome: Progressing Flowsheets (Taken 02/24/2019 0913) Pt will Ambulate: 25 feet; with rolling walker; with minimal assist; with moderate assist   9:14 AM, 02/24/19 Lonell Grandchild, MPT Physical Therapist with [facility] 336 [phone] office [phone] mobile phone

## 2019-02-25 LAB — BASIC METABOLIC PANEL
Anion gap: 11 (ref 5–15)
BUN: 24 mg/dL — ABNORMAL HIGH (ref 8–23)
CO2: 26 mmol/L (ref 22–32)
Calcium: 9 mg/dL (ref 8.9–10.3)
Chloride: 101 mmol/L (ref 98–111)
Creatinine, Ser: 1.31 mg/dL — ABNORMAL HIGH (ref 0.61–1.24)
GFR calc Af Amer: 56 mL/min — ABNORMAL LOW (ref >60)
GFR calc non Af Amer: 48 mL/min — ABNORMAL LOW (ref >60)
Glucose, Bld: 111 mg/dL — ABNORMAL HIGH (ref 70–99)
Potassium: 4.2 mmol/L (ref 3.5–5.1)
Sodium: 138 mmol/L (ref 135–145)

## 2019-02-25 LAB — CBC
HCT: 34.5 % — ABNORMAL LOW (ref 39.0–52.0)
Hemoglobin: 10.5 g/dL — ABNORMAL LOW (ref 13.0–17.0)
MCH: 27.6 pg (ref 26.0–34.0)
MCHC: 30.4 g/dL (ref 30.0–36.0)
MCV: 90.6 fL (ref 80.0–100.0)
Platelets: 282 10*3/uL (ref 150–400)
RBC: 3.81 MIL/uL — ABNORMAL LOW (ref 4.22–5.81)
RDW: 16.8 % — ABNORMAL HIGH (ref 11.5–15.5)
WBC: 7.7 10*3/uL (ref 4.0–10.5)
nRBC: 0 % (ref 0.0–0.2)

## 2019-02-25 LAB — PROTIME-INR
INR: 2.9 — ABNORMAL HIGH (ref 0.8–1.2)
Prothrombin Time: 29.5 seconds — ABNORMAL HIGH (ref 11.4–15.2)

## 2019-02-25 LAB — SARS CORONAVIRUS 2 BY RT PCR (HOSPITAL ORDER, PERFORMED IN ~~LOC~~ HOSPITAL LAB): SARS Coronavirus 2: NEGATIVE

## 2019-02-25 MED ORDER — GUAIFENESIN 100 MG/5ML PO SOLN
5.0000 mL | ORAL | Status: DC
  Administered 2019-02-25 – 2019-02-26 (×7): 100 mg via ORAL
  Filled 2019-02-25 (×7): qty 5

## 2019-02-25 MED ORDER — FUROSEMIDE 20 MG PO TABS
20.0000 mg | ORAL_TABLET | Freq: Every day | ORAL | Status: DC
  Administered 2019-02-26: 11:00:00 20 mg via ORAL
  Filled 2019-02-25: qty 1

## 2019-02-25 MED ORDER — SERTRALINE HCL 50 MG PO TABS
50.0000 mg | ORAL_TABLET | Freq: Every day | ORAL | Status: DC
  Administered 2019-02-25 – 2019-02-26 (×2): 50 mg via ORAL
  Filled 2019-02-25 (×2): qty 1

## 2019-02-25 NOTE — Progress Notes (Signed)
Progress Note  Patient Name: Sergio Boyle Date of Encounter: 02/25/2019  Primary Cardiologist: Satira Sark, MD  Subjective   Weak, "feels tired."  Ate limited breakfast this morning.  No chest pain or palpitations.  Inpatient Medications    Scheduled Meds: . ALPRAZolam  0.25 mg Oral BID  . amoxicillin  500 mg Oral Q8H  . atorvastatin  20 mg Oral QHS  . feeding supplement (ENSURE ENLIVE)  237 mL Oral BID BM  . furosemide  20 mg Intravenous BID  . guaiFENesin  5 mL Oral Q4H while awake  . multivitamin with minerals  1 tablet Oral Daily  . pantoprazole  40 mg Oral QAC breakfast  . sertraline  50 mg Oral Daily  . Warfarin - Pharmacist Dosing Inpatient   Does not apply q1800    PRN Meds: acetaminophen **OR** acetaminophen   Vital Signs    Vitals:   02/24/19 1325 02/24/19 2119 02/25/19 0332 02/25/19 0605  BP: 134/71 137/68  106/67  Pulse: 85 87  73  Resp: 16 20  18   Temp: 98.9 F (37.2 C) 98.2 F (36.8 C)  98 F (36.7 C)  TempSrc: Oral Oral  Oral  SpO2: 99% 94%  98%  Weight:   71.9 kg   Height:        Intake/Output Summary (Last 24 hours) at 02/25/2019 0937 Last data filed at 02/25/2019 0840 Gross per 24 hour  Intake 480 ml  Output 2225 ml  Net -1745 ml   Filed Weights   02/23/19 1631 02/25/19 0332  Weight: 68.5 kg 71.9 kg    Telemetry    Atrial fibrillation. Personally reviewed.  ECG    Tracing from 02/23/2019 shows rate controlled atrial fibrillation with right bundle branch block and left anterior fascicular block, repolarization abnormalities.  Personally reviewed.  Physical Exam   GEN:  Frail, chronically ill-appearing elderly male, no acute distress.   Neck: No JVD. Cardiac:  Regularly irregular, no gallop.  Respiratory:  Scattered rhonchi, nonlabored. GI: Soft, nontender, bowel sounds present. MS: No edema; No deformity. Neuro:  Nonfocal. Psych: Alert and oriented x 3. Flat affect.  Labs    Chemistry Recent Labs  Lab 02/23/19  1739 02/24/19 0510 02/25/19 0424  NA 137 138 138  K 5.0 4.7 4.2  CL 104 104 101  CO2 24 25 26   GLUCOSE 126* 107* 111*  BUN 23 20 24*  CREATININE 1.20 1.29* 1.31*  CALCIUM 8.9 9.0 9.0  GFRNONAA 54* 49* 48*  GFRAA >60 57* 56*  ANIONGAP 9 9 11      Hematology Recent Labs  Lab 02/23/19 1739 02/24/19 0510 02/25/19 0424  WBC 6.9 7.5 7.7  RBC 3.88* 4.00* 3.81*  HGB 10.8* 10.8* 10.5*  HCT 34.8* 36.4* 34.5*  MCV 89.7 91.0 90.6  MCH 27.8 27.0 27.6  MCHC 31.0 29.7* 30.4  RDW 17.1* 17.0* 16.8*  PLT 254 267 282    Cardiac Enzymes Recent Labs  Lab 02/23/19 1739  TROPONINI <0.03   No results for input(s): TROPIPOC in the last 168 hours.   BNP Recent Labs  Lab 02/23/19 1932  BNP 225.0*     Radiology    Dg Chest 2 View  Result Date: 02/23/2019 CLINICAL DATA:  85 year old male with weakness and inability to walk EXAM: CHEST - 2 VIEW COMPARISON:  08/24/2017, CT 08/13/2018 FINDINGS: Cardiomediastinal silhouette unchanged in size and contour. Surgical changes of median sternotomy and CABG. Reticular pattern of opacity of the bilateral lungs, worsening since the prior.  Interlobular septal thickening. Opacities at the lung bases with blunting of the bilateral costophrenic angles, worse on the right. Meniscus on the lateral view. IMPRESSION: Congestive heart failure superimposed on interstitial fibrosis, and right greater than left pleural effusion. Surgical changes of median sternotomy and CABG Electronically Signed   By: Corrie Mckusick D.O.   On: 02/23/2019 18:33    Cardiac Studies   Echocardiogram 02/24/2019:  1. The left ventricle has normal systolic function, with an ejection fraction of 55-60%. There is anterosepatal hypokinesis. The cavity size was normal. There is mildly increased left ventricular wall thickness. Left ventricular diastolic Doppler  parameters are indeterminate. There is right ventricular volume overload.  2. The right ventricle has normal systolic function.  The cavity was normal. There is no increase in right ventricular wall thickness. Right ventricular systolic pressure is moderately elevated with an estimated pressure of 52.4 mmHg.  3. Left atrial size was severely dilated.  4. Right atrial size was severely dilated.  5. The aortic valve is tricuspid. Mild calcification of the aortic valve. Aortic valve regurgitation is mild by color flow Doppler. Moderate aortic annular calcification noted.  6. The mitral valve is grossly normal. Mild thickening of the mitral valve leaflet. There is mild mitral annular calcification present.  7. The tricuspid valve is grossly normal. There is mild tricuspid regurgitation.  8. The aortic root is normal in size and structure.  9. The inferior vena cava was dilated in size with >50% respiratory variability.  Patient Profile     85 y.o. male with past medical history of CAD (s/p prior intervention to LAD and D1 with SVG-D1 in the past performed at [facility]), chronic diastolic CHF, permanent atrial fibrillation (on Coumadin), HTN, and HLD  presenting with increasing falls and failure to thrive.  Assessment & Plan    1.  Permanent atrial fibrillation.  Heart rate has been adequately controlled.  Digoxin was stopped yesterday and he otherwise continues on Coumadin.  He is not on any AV nodal blockers.  He does have evidence of conduction system disease by ECG.  2.  CAD with previous LAD and first diagonal intervention with prior vein graft to first diagonal at [facility].  No active angina symptoms at this time.  He is not on aspirin given concurrent use of Coumadin.  Continues on statin therapy.  3.  Essential hypertension, blood pressure is stable.  4.  Chronic diastolic heart failure.  Follow-up echocardiogram reveals LVEF 55 to 60% range.  He does have some right heart failure symptoms with moderately elevated RVSP, severe biatrial enlargement.  Continue Coumadin, Lipitor, Lasix at 20 mg daily for now.  We have stopped  Lanoxin.  If he needs heart rate control medication, would suggest low-dose beta-blocker instead, although his conduction system disease at baseline may not require any AV nodal blockers.  No further cardiac testing is planned at this point.  [facility] will sign off.   Medication Recommendations: As noted above. Other recommendations (labs, testing, etc): No further cardiac testing planned at this time. Follow up as an outpatient: Can arrange office follow-up in approximately 3 months.  Signed, Rozann Lesches, MD  02/25/2019, 9:37 AM

## 2019-02-25 NOTE — Progress Notes (Signed)
Subjective: He says he feels bad.  He says he has "lost the will to live" he is concerned about whether physical therapy would help him or not.  He does not have any other new complaints.  Objective: Vital signs in last 24 hours: Temp:  [98 F (36.7 C)-98.9 F (37.2 C)] 98 F (36.7 C) (04/29 0605) Pulse Rate:  [73-87] 73 (04/29 0605) Resp:  [16-20] 18 (04/29 0605) BP: (106-137)/(67-71) 106/67 (04/29 0605) SpO2:  [94 %-99 %] 98 % (04/29 0605) Weight:  [71.9 kg] 71.9 kg (04/29 0332) Weight change: 3.407 kg Last BM Date: 02/23/19  Intake/Output from previous day: 04/28 0701 - 04/29 0700 In: 360 [P.O.:360] Out: 2225 [Urine:2225]  PHYSICAL EXAM General appearance: alert, cooperative and Appears depressed Resp: rhonchi bilaterally Cardio: irregularly irregular rhythm GI: soft, non-tender; bowel sounds normal; no masses,  no organomegaly Extremities: He has less edema than on admission  Lab Results:  Results for orders placed or performed during the hospital encounter of 02/23/19 (from the past 48 hour(s))  Urinalysis, Routine w reflex microscopic     Status: Abnormal   Collection Time: 02/23/19  5:01 PM  Result Value Ref Range   Color, Urine YELLOW YELLOW   APPearance CLEAR CLEAR   Specific Gravity, Urine 1.008 1.005 - 1.030   pH 7.0 5.0 - 8.0   Glucose, UA NEGATIVE NEGATIVE mg/dL   Hgb urine dipstick SMALL (A) NEGATIVE   Bilirubin Urine NEGATIVE NEGATIVE   Ketones, ur NEGATIVE NEGATIVE mg/dL   Protein, ur NEGATIVE NEGATIVE mg/dL   Nitrite NEGATIVE NEGATIVE   Leukocytes,Ua NEGATIVE NEGATIVE   RBC / HPF 0-5 0 - 5 RBC/hpf   WBC, UA 0-5 0 - 5 WBC/hpf   Bacteria, UA NONE SEEN NONE SEEN    Comment: Performed at [facility], [address], [place], [place] [address]  Basic metabolic panel     Status: Abnormal   Collection Time: 02/23/19  5:39 PM  Result Value Ref Range   Sodium 137 135 - 145 mmol/L   Potassium 5.0 3.5 - 5.1 mmol/L   Chloride 104 98 - 111 mmol/L   CO2 24  22 - 32 mmol/L   Glucose, Bld 126 (H) 70 - 99 mg/dL   BUN 23 8 - 23 mg/dL   Creatinine, Ser 1.20 0.61 - 1.24 mg/dL   Calcium 8.9 8.9 - 10.3 mg/dL   GFR calc non Af Amer 54 (L) >60 mL/min   GFR calc Af Amer >60 >60 mL/min   Anion gap 9 5 - 15    Comment: Performed at [facility], [address] [place], [place] [address]  CBC with Differential     Status: Abnormal   Collection Time: 02/23/19  5:39 PM  Result Value Ref Range   WBC 6.9 4.0 - 10.5 K/uL   RBC 3.88 (L) 4.22 - 5.81 MIL/uL   Hemoglobin 10.8 (L) 13.0 - 17.0 g/dL   HCT 34.8 (L) 39.0 - 52.0 %   MCV 89.7 80.0 - 100.0 fL   MCH 27.8 26.0 - 34.0 pg   MCHC 31.0 30.0 - 36.0 g/dL   RDW 17.1 (H) 11.5 - 15.5 %   Platelets 254 150 - 400 K/uL   nRBC 0.0 0.0 - 0.2 %   Neutrophils Relative % 66 %   Neutro Abs 4.5 1.7 - 7.7 K/uL   Lymphocytes Relative 18 %   Lymphs Abs 1.2 0.7 - 4.0 K/uL   Monocytes Relative 12 %   Monocytes Absolute 0.8 0.1 -  1.0 K/uL   Eosinophils Relative 4 %   Eosinophils Absolute 0.3 0.0 - 0.5 K/uL   Basophils Relative 0 %   Basophils Absolute 0.0 0.0 - 0.1 K/uL   Immature Granulocytes 0 %   Abs Immature Granulocytes 0.02 0.00 - 0.07 K/uL    Comment: Performed at [facility], [address] [place], [place] [address]  Troponin I - Once     Status: None   Collection Time: 02/23/19  5:39 PM  Result Value Ref Range   Troponin I <0.03 <0.03 ng/mL    Comment: Performed at [facility], [address] [place], [place] [address]  Protime-INR     Status: Abnormal   Collection Time: 02/23/19  5:39 PM  Result Value Ref Range   Prothrombin Time 26.1 (H) 11.4 - 15.2 seconds   INR 2.4 (H) 0.8 - 1.2    Comment: (NOTE) INR goal varies based on device and disease states. Performed at [facility], [address], [place], [place] [address]   Magnesium     Status: None   Collection Time: 02/23/19  5:39 PM  Result Value Ref Range   Magnesium 1.7 1.7 - 2.4 mg/dL    Comment: Performed at [facility], [address] [place], [place] [address]  Brain natriuretic peptide     Status: Abnormal   Collection Time: 02/23/19  7:32 PM  Result Value Ref Range   B Natriuretic Peptide 225.0 (H) 0.0 - 100.0 pg/mL    Comment: Performed at [facility], [address], [place], [place] [address]  Magnesium     Status: None   Collection Time: 02/24/19  5:10 AM  Result Value Ref Range   Magnesium 1.7 1.7 - 2.4 mg/dL    Comment: Performed at [facility], [address], [place], [place] [address]  TSH     Status: None   Collection Time: 02/24/19  5:10 AM  Result Value Ref Range   TSH 1.106 0.350 - 4.500 uIU/mL    Comment: Performed by a 3rd Generation assay with a functional sensitivity of <=0.01 uIU/mL. Performed at [facility], [address], [place], [place] [address]   Basic metabolic panel     Status: Abnormal   Collection Time: 02/24/19  5:10 AM  Result Value Ref Range   Sodium 138 135 - 145 mmol/L   Potassium 4.7 3.5 - 5.1 mmol/L   Chloride 104 98 - 111 mmol/L   CO2 25 22 - 32 mmol/L   Glucose, Bld 107 (H) 70 - 99 mg/dL   BUN 20 8 - 23 mg/dL   Creatinine, Ser 1.29 (H) 0.61 - 1.24 mg/dL   Calcium 9.0 8.9 - 10.3 mg/dL   GFR calc non Af Amer 49 (L) >60 mL/min   GFR calc Af Amer 57 (L) >60 mL/min   Anion gap 9 5 - 15    Comment: Performed at [facility], [address], [place],  [address]  CBC     Status: Abnormal   Collection Time: 02/24/19  5:10 AM  Result Value Ref Range   WBC 7.5 4.0 - 10.5 K/uL   RBC 4.00 (L) 4.22 - 5.81 MIL/uL   Hemoglobin 10.8 (L) 13.0 - 17.0 g/dL   HCT 36.4 (L) 39.0 - 52.0 %   MCV 91.0 80.0 - 100.0 fL   MCH 27.0 26.0 - 34.0 pg   MCHC 29.7 (L) 30.0 - 36.0 g/dL   RDW 17.0 (H) 11.5 - 15.5 %   Platelets 267 150 -  400 K/uL   nRBC 0.0 0.0 - 0.2 %    Comment: Performed at [facility], [address] [place], [place] [address]  Digoxin level     Status: Abnormal   Collection Time: 02/24/19  5:10 AM  Result Value Ref Range   Digoxin Level 0.7 (L) 0.8 - 2.0 ng/mL     Comment: Performed at [facility], [address], [place], [place] [address]  Protime-INR     Status: Abnormal   Collection Time: 02/24/19  5:10 AM  Result Value Ref Range   Prothrombin Time 25.9 (H) 11.4 - 15.2 seconds   INR 2.4 (H) 0.8 - 1.2    Comment: (NOTE) INR goal varies based on device and disease states. Performed at [facility], [address], [place], [place] [address]   CBC     Status: Abnormal   Collection Time: 02/25/19  4:24 AM  Result Value Ref Range   WBC 7.7 4.0 - 10.5 K/uL   RBC 3.81 (L) 4.22 - 5.81 MIL/uL   Hemoglobin 10.5 (L) 13.0 - 17.0 g/dL   HCT 34.5 (L) 39.0 - 52.0 %   MCV 90.6 80.0 - 100.0 fL   MCH 27.6 26.0 - 34.0 pg   MCHC 30.4 30.0 - 36.0 g/dL   RDW 16.8 (H) 11.5 - 15.5 %   Platelets 282 150 - 400 K/uL   nRBC 0.0 0.0 - 0.2 %    Comment: Performed at [facility], [address], [place], [place] [address]  Protime-INR     Status: Abnormal   Collection Time: 02/25/19  4:24 AM  Result Value Ref Range   Prothrombin Time 29.5 (H) 11.4 - 15.2 seconds   INR 2.9 (H) 0.8 - 1.2    Comment: (NOTE) INR goal varies based on device and disease states. Performed at [facility], [address], [place], [place] [address]   Basic metabolic panel     Status: Abnormal   Collection Time: 02/25/19  4:24 AM  Result Value Ref Range   Sodium 138 135 - 145 mmol/L   Potassium 4.2 3.5 - 5.1 mmol/L   Chloride 101 98 - 111 mmol/L   CO2 26 22 - 32 mmol/L   Glucose, Bld 111 (H) 70 - 99 mg/dL   BUN 24 (H) 8 - 23 mg/dL   Creatinine, Ser 1.31 (H) 0.61 - 1.24 mg/dL   Calcium 9.0 8.9 - 10.3 mg/dL   GFR calc non Af Amer 48 (L) >60 mL/min   GFR calc Af Amer 56 (L) >60 mL/min   Anion gap 11 5 - 15    Comment: Performed at [facility], [address], [place], [place] [address]    ABGS No results for input(s): PHART, PO2ART, TCO2, HCO3 in the last 72 hours.  Invalid input(s): PCO2 CULTURES No results found for this or any previous visit (from the past 240  hour(s)). Studies/Results: Dg Chest 2 View  Result Date: 02/23/2019 CLINICAL DATA:  85 year old male with weakness and inability to walk EXAM: CHEST - 2 VIEW COMPARISON:  08/24/2017, CT 08/13/2018 FINDINGS: Cardiomediastinal silhouette unchanged in size and contour. Surgical changes of median sternotomy and CABG. Reticular pattern of opacity of the bilateral lungs, worsening since the prior. Interlobular septal thickening. Opacities at the lung bases with blunting of the bilateral costophrenic angles, worse on the right. Meniscus on the lateral view. IMPRESSION: Congestive heart failure superimposed on interstitial fibrosis, and right greater than left pleural effusion. Surgical changes of median sternotomy and CABG Electronically  Signed   By: Corrie Mckusick D.O.   On: 02/23/2019 18:33    Medications:  Prior to Admission:  Medications Prior to Admission  Medication Sig Dispense Refill Last Dose  . ALPRAZolam (XANAX) 0.5 MG tablet TAKE ONE TABLET BY MOUTH TWICE A DAY (Patient taking differently: Take 0.25 mg by mouth 2 (two) times daily. *May take additional dose as needed for anxiety) 60 tablet 1 02/23/2019 at Unknown time  . atorvastatin (LIPITOR) 20 MG tablet Take 20 mg by mouth at bedtime.    02/22/2019 at Unknown time  . cephALEXin (KEFLEX) 500 MG capsule Take 500 mg by mouth 3 (three) times daily. 10 day course starting on 02/16/2019   02/23/2019 at Unknown time  . cholecalciferol (VITAMIN D) 1000 UNITS tablet Take 1,000 Units by mouth daily.   02/23/2019 at Unknown time  . cyanocobalamin 500 MCG tablet Take 500 mcg by mouth daily.   02/23/2019 at Unknown time  . digoxin (LANOXIN) 0.125 MG tablet TAKE ONE-HALF TABLET BY MOUTH ONCE A DAY (Patient taking differently: Take 0.0625 mg by mouth daily. ) 45 tablet 3 02/23/2019 at Unknown time  . guaiFENesin (MUCINEX) 600 MG 12 hr tablet Take 300 mg by mouth 3 (three) times daily.    02/23/2019 at Unknown time  . magnesium oxide (MAG-OX 400) 400 MG tablet Take  1 tablet (400 mg total) by mouth every 7 (seven) days.   02/18/2019 at Unknown time  . nitroGLYCERIN (NITROSTAT) 0.4 MG SL tablet Place 1 tablet (0.4 mg total) under the tongue every 5 (five) minutes as needed. 25 tablet 3 unknown  . oxyCODONE-acetaminophen (PERCOCET/ROXICET) 5-325 MG tablet Take 1 tablet by mouth every 4 (four) hours as needed. (Patient taking differently: Take 1 tablet by mouth every 4 (four) hours as needed for moderate pain. ) 20 tablet 0 unknown  . pantoprazole (PROTONIX) 40 MG tablet TAKE 1 TABLET BY MOUTH EVERY DAY BEFORE BREAKFAST (Patient taking differently: Take 40 mg by mouth daily before breakfast. ) 30 tablet 5 02/23/2019 at Unknown time  . Propylene Glycol (SYSTANE BALANCE OP) Place 1-2 drops into both eyes daily as needed (for dry eye relief).    Past Month at Unknown time  . warfarin (COUMADIN) 3 MG tablet Take 3 mg by mouth every evening.    02/22/2019 at 1800  . amoxicillin (AMOXIL) 500 MG capsule Take 500 mg by mouth 3 (three) times daily. 10 day course starting on 02/23/2019   Not Taking at Unknown time   Scheduled: . ALPRAZolam  0.25 mg Oral BID  . amoxicillin  500 mg Oral Q8H  . atorvastatin  20 mg Oral QHS  . feeding supplement (ENSURE ENLIVE)  237 mL Oral BID BM  . furosemide  20 mg Intravenous BID  . guaiFENesin  5 mL Oral Q4H while awake  . multivitamin with minerals  1 tablet Oral Daily  . pantoprazole  40 mg Oral QAC breakfast  . sertraline  50 mg Oral Daily  . Warfarin - Pharmacist Dosing Inpatient   Does not apply q1800   Continuous:  FWY:OVZCHYIFOYDXA **OR** acetaminophen  Assesment: He was admitted with what was felt to be acute diastolic heart failure.  He had echocardiogram done yesterday and that shows good systolic function.  Diastolic parameters are indeterminate.  He has chronic atrial fib and he is on chronic anticoagulation which is ongoing.  He has chronic renal failure and his renal function looks about the same with diuresis.  He  has generalized weakness which is  getting worse  He has what may be adult failure to thrive and clearly does have some depression Principal Problem:   Acute diastolic CHF (congestive heart failure) (HCC) Active Problems:   Mixed hyperlipidemia   ATRIAL FIBRILLATION, CHRONIC   GERD   Generalized weakness    Plan: Add sertraline.  Discussed with Dr. Domenic Polite from cardiology who is going to adjust his medications.  Plan for transfer to skilled care facility tomorrow    LOS: 2 days   Alonza Bogus 02/25/2019, 8:43 AM

## 2019-02-25 NOTE — Care Management Important Message (Signed)
Important Message  Patient Details  Name: Sergio Boyle MRN: [id number] Date of Birth: 1928/05/23   Medicare Important Message Given:  Yes    Tommy Medal 02/25/2019, 2:21 PM

## 2019-02-25 NOTE — Progress Notes (Signed)
Physical Therapy Treatment Patient Details Name: Sergio Boyle MRN: [id number] DOB: 1927/11/14 Today's Date: 02/25/2019    History of Present Illness Sergio Boyle is a 85 y.o. male with medical history significant for coronary artery disease status post CABG in 1996, hyperlipidemia, chronic atrial fibrillation chronically anticoagulated on warfarin, who is admitted to [facility] on 02/23/2019 with suspected acutely decompensated heart failure after presenting from home to [facility] ED complaining of generalized weakness.    PT Comments    Pt reports his lower back is "irritated", examined and noted increased reduced lumbar region, RN aware.  Pt with mod A and increased required with bed mobility and transfer training.  Cueing for hand and foot placement to assist with scooting forward to EOB.  Mod to max A required with transfer training to chair, pt limited to 5-6 steps due to weakness and fatigue.  EOS pt left in chair with call bell within reach.  Assessed his O2 saturation through session with 2L O2a through nasal canal.  Supine 99% saturation with HR at 83 bpm.  Sitting 98% saturation with HR at 89 bpm and 96% saturatin with HR at 84 bpm following transfer.  No reports of pain, was limited by [place].     Follow Up Recommendations  SNF     Equipment Recommendations  None recommended by PT    Recommendations for Other Services       Precautions / Restrictions Precautions Precautions: Fall Restrictions Weight Bearing Restrictions: No    Mobility  Bed Mobility Overal bed mobility: Needs Assistance Bed Mobility: Supine to Sit     Supine to sit: Mod assist     General bed mobility comments: slow labored movement  Transfers Overall transfer level: Needs assistance Equipment used: Rolling walker (2 wheeled) Transfers: Sit to/from Stand;Stand Pivot Transfers(scooting toward EOB- mod A) Sit to Stand: Mod assist Stand pivot transfers: Mod assist       General transfer comment:  labored movement, buckling of knees due to weakness  Ambulation/Gait Ambulation/Gait assistance: Max assist;Mod assist Gait Distance (Feet): 5 Feet Assistive device: Rolling walker (2 wheeled) Gait Pattern/deviations: Decreased step length - right;Decreased step length - left;Decreased stride length Gait velocity: slow   General Gait Details: limited to 5-6 slow unsteady steps at bedside due to BLE weakness   Stairs             Wheelchair Mobility    Modified Rankin (Stroke Patients Only)       Balance                                            Cognition Arousal/Alertness: Awake/alert Behavior During Therapy: WFL for tasks assessed/performed Overall Cognitive Status: Within Functional Limits for tasks assessed                                        Exercises      General Comments        Pertinent Vitals/Pain Pain Assessment: 0-10 Pain Score: 3  Pain Location: chronic low back- irritated Pain Descriptors / Indicators: Aching Pain Intervention(s): Monitored during session;Limited activity within patient's tolerance    Home Living                      Prior Function  PT Goals (current goals can now be found in the care plan section)      Frequency    Min 3X/week      PT Plan      Co-evaluation              AM-PAC PT "6 Clicks" Mobility   Outcome Measure  Help needed turning from your back to your side while in a flat bed without using bedrails?: A Little Help needed moving from lying on your back to sitting on the side of a flat bed without using bedrails?: A Lot Help needed moving to and from a bed to a chair (including a wheelchair)?: A Lot Help needed standing up from a chair using your arms (e.g., wheelchair or bedside chair)?: A Lot Help needed to walk in hospital room?: A Lot Help needed climbing 3-5 steps with a railing? : Total 6 Click Score: 12    End of Session  Equipment Utilized During Treatment: Gait belt Activity Tolerance: Patient tolerated treatment well;Patient limited by fatigue Patient left: in chair;with call bell/phone within reach Nurse Communication: Mobility status PT Visit Diagnosis: Unsteadiness on feet (R26.81);Other abnormalities of gait and mobility (R26.89);Muscle weakness (generalized) (M62.81)     Time: 0347-4259 PT Time Calculation (min) (ACUTE ONLY): 18 min  Charges:  $Therapeutic Activity: 8-22 mins                     [address] LPTA; CBIS [phone]  Aldona Lento 02/25/2019, 10:32 AM

## 2019-02-25 NOTE — Progress Notes (Signed)
ANTICOAGULATION CONSULT NOTE -  Pharmacy Consult for  Warfarin dosing Indication: atrial fibrillation  Allergies  Allergen Reactions  . Sulfonamide Derivatives     Patient says his wife says he's allergic to sulfa.    Patient Measurements: Height: 5\' 11"  (180.3 cm) Weight: 158 lb 8.2 oz (71.9 kg) IBW/kg (Calculated) : 75.3 Heparin Dosing Weight:    Vital Signs: Temp: 98 F (36.7 C) (04/29 0605) Temp Source: Oral (04/29 0605) BP: 106/67 (04/29 0605) Pulse Rate: 73 (04/29 0605)  Labs: Recent Labs    02/23/19 1739 02/24/19 0510 02/25/19 0424  HGB 10.8* 10.8* 10.5*  HCT 34.8* 36.4* 34.5*  PLT 254 267 282  LABPROT 26.1* 25.9* 29.5*  INR 2.4* 2.4* 2.9*  CREATININE 1.20 1.29* 1.31*  TROPONINI <0.03  --   --    Estimated Creatinine Clearance: 39.6 mL/min (A) (by C-G formula based on SCr of 1.31 mg/dL (H)).  Medical History: Past Medical History:  Diagnosis Date  . Atrial fibrillation ([place])   . Cancer ([place])   . Coronary atherosclerosis of native coronary artery    Prior stenting of LAD and diagonal at [facility] with subsequent SVG to diagonal, LVEF 55-60%  . Dysphagia   . GERD   . Hiccups   . History of prostate cancer   . Mixed hyperlipidemia   . Weight loss      Assessment: Pharmacy consulted to dose warfarin for this 85 yo  male on chronic anti-coagulation with warfarin for atrial fibrillation.  CBC is WNL.  Home dose: warfarin 3mg  daily  INR increased from 2.4 to 2.9- will be cautious with dosing.    Goal of Therapy:  INR 2-3 Monitor platelets by anticoagulation protocol: Yes   Plan:  Hold warfarin x 1 dose  Monitor CBC and INR  Margot Ables, PharmD Clinical Pharmacist 02/25/2019 8:49 AM

## 2019-02-25 NOTE — Evaluation (Signed)
Clinical/Bedside Swallow Evaluation Patient Details  Name: Sergio Boyle MRN: [id number] Date of Birth: [birth year]  Today's Date: 02/25/2019 Time: SLP Start Time (ACUTE ONLY): 1119 SLP Stop Time (ACUTE ONLY): 1138 SLP Time Calculation (min) (ACUTE ONLY): 19 min  Past Medical History:  Past Medical History:  Diagnosis Date  . Atrial fibrillation ([place])   . Cancer ([place])   . Coronary atherosclerosis of native coronary artery    Prior stenting of LAD and diagonal at [facility] with subsequent SVG to diagonal, LVEF 55-60%  . Dysphagia   . GERD   . Hiccups   . History of prostate cancer   . Mixed hyperlipidemia   . Weight loss    Past Surgical History:  Past Surgical History:  Procedure Laterality Date  . Bilateral inguinal hernia repair    . bulging disc    . COLONOSCOPY  06/2010  . CORONARY ARTERY BYPASS GRAFT  1996    Emergent SVG to diagonal at [facility]  . ESOPHAGEAL DILATION N/A 06/10/2015   Procedure: ESOPHAGEAL DILATION;  Surgeon: Rogene Houston, MD;  Location: [facility] ENDO SUITE;  Service: Endoscopy;  Laterality: N/A;  . ESOPHAGOGASTRODUODENOSCOPY     w/esophageal dilation   . ESOPHAGOGASTRODUODENOSCOPY N/A 06/10/2015   Procedure: ESOPHAGOGASTRODUODENOSCOPY (EGD);  Surgeon: Rogene Houston, MD;  Location: [facility] ENDO [facility];  Service: Endoscopy;  Laterality: N/A;  245  . IR KYPHO LUMBAR INC FX REDUCE BONE BX UNI/BIL CANNULATION INC/IMAGING  03/07/2017  . IR RADIOLOGIST EVAL & MGMT  02/28/2017  . UPPER GASTROINTESTINAL ENDOSCOPY  06/2010   HPI:  Sergio Boyle is a 85 y.o. male with medical history significant for coronary artery disease status post CABG in 1996, hyperlipidemia, chronic atrial fibrillation chronically anticoagulated on warfarin, who is admitted to [facility] on 02/23/2019 with suspected acutely decompensated heart failure after presenting from home to [facility] ED complaining of generalized weakness. BSE requested. Pt had MBSS in 2016 and has a h/o of esophageal dysphagia   MBSS from 2016:  <<Sergio Boyle's pharyngeal function is negatively impacted by decreased clearance of barium/po in esophagus. Unfortunately, Sergio Boyle has severe esophageal dysmotility (esophageal sweep revealed full column stasis of contrast to the cervical esophagus with spasms in mid/distal esophagus) which negatively impacts pharyngeal pressure resulting in initial swallows with little pharyngeal residue, but significant pyriform residue with subsequent swallows (suspect as esophagus fills up). Swallow initiation is timely and hyolaryngeal excursion appears adequate. No penetration or aspiration observed. Pt was able to reduce moderate pyriform residuals to min with repeat/dry swallows.   Pt was given barium pill (12.64mm) towards the end of the study (ie. after liquids, puree, and solids) and it became transiently delayed in the valleculae, but cleared after repeated swallows of thin. The pill then traversed through barium filled esophagus and was retained in distal esophagus. Pt was turned AP and took sequential straw sips thin barium which was observed to pass through UES and then backflow into pharynx (this is likely the regurgitation that pt describes).   Interestingly, pt does not report globus sensation, heartburn, or feeling of early satiety despite significant retention in esophagus. He was able to sense when barium tablet was transiently delayed in valleculae. Pt's primary complaints seem to be inability to clear what he perceives as "phlegm".   The study was reviewed with Sergio Boyle and he was encouraged to consume a mechanical soft diet with no liquid restrictions, eat frequent/smaller meals, crush or break large pills in small amount puree as able (ok for small  pills whole with water), chew foods thoroughly, swallow 2x for each bite/sip, and implement strict reflux precautions (remain upright for 30-60" after po intake, elevate HOB). He is at risk for aspiration given severity of esophageal dysphagia and  retention in pyriforms post swallow and as meal progresses.>>    Assessment / Plan / Recommendation Clinical Impression  Clinical swallow evaluation completed with Pt in room while sitting up in chair. Pt has a h/o esophageal dysphagia (Sergio Boyle) and had MBSS in 2016 which was notable for esophageal dysphagia. Oral motor examination reveals excessive oral secretions and Pt c/o phlegm (Pt completed oral care with toothbrush). Pt with mild generalized oral weakness, no facial asymmetry. He consumed self presented thin liquids via straw sips, puree, regular textures, and mechanical soft textures without incident.  Pt with mildly prolonged oral phase with solid textures. Given h/o esophageal dysphagia, recommend D3/mech soft textures (will advance from puree) and thin liquids with intermittent supervision, ok for straws, and po medications whole in puree or with water. Pt in agreement with plan of care. SLP will sign off at this time.   SLP Visit Diagnosis: Dysphagia, unspecified (R13.10)    Aspiration Risk  Mild aspiration risk    Diet Recommendation Dysphagia 3 (Mech soft);Thin liquid   Liquid Administration via: Cup;Straw Medication Administration: Whole meds with puree Supervision: Patient able to self feed;Intermittent supervision to cue for compensatory strategies Compensations: Slow rate Postural Changes: Seated upright at 90 degrees;Remain upright for at least 30 minutes after po intake    Other  Recommendations Oral Care Recommendations: Oral care BID Other Recommendations: Clarify dietary restrictions   Follow up Recommendations None      Frequency and Duration            Prognosis Prognosis for Safe Diet Advancement: Good      Swallow Study   General Date of Onset: 02/23/19 HPI: Sergio Boyle is a 85 y.o. male with medical history significant for coronary artery disease status post CABG in 1996, hyperlipidemia, chronic atrial fibrillation chronically anticoagulated on  warfarin, who is admitted to [facility] on 02/23/2019 with suspected acutely decompensated heart failure after presenting from home to [facility] ED complaining of generalized weakness. BSE requested. Pt had MBSS in 2016 and has a h/o of esophageal dysphagia Type of Study: Bedside Swallow Evaluation Previous Swallow Assessment: 2016 Diet Prior to this Study: Thin liquids;Dysphagia 1 (puree) Temperature Spikes Noted: No Respiratory Status: Nasal cannula History of Recent Intubation: No Behavior/Cognition: Alert;Cooperative;Pleasant mood Oral Cavity Assessment: Excessive secretions Oral Care Completed by SLP: Yes Oral Cavity - Dentition: Adequate natural dentition;Missing dentition Vision: Functional for self-feeding Self-Feeding Abilities: Able to feed self Patient Positioning: Upright in chair Baseline Vocal Quality: Low vocal intensity Volitional Cough: Strong;Congested Volitional Swallow: Able to elicit    Oral/Motor/Sensory Function Overall Oral Motor/Sensory Function: Within functional limits   Ice Chips Ice chips: Within functional limits Presentation: Spoon   Thin Liquid Thin Liquid: Within functional limits Presentation: Self Fed;Straw    Nectar Thick Nectar Thick Liquid: Not tested   Honey Thick Honey Thick Liquid: Not tested   Puree Puree: Within functional limits Presentation: Spoon   Solid     Solid: Within functional limits Presentation: Self Fed     Thank you,  Genene Churn, [place]  PORTER,DABNEY 02/25/2019,11:38 AM

## 2019-02-25 NOTE — Progress Notes (Signed)
SLP Cancellation Note  Patient Details Name: CLETO CLAGGETT MRN: [id number] DOB: 11/19/1927   Cancelled treatment:       Reason Eval/Treat Not Completed: Patient at procedure or test/unavailable; Pt working with PT. SLP will come back later this AM.  Thank you,  Genene Churn, [place]    [place] 02/25/2019, 9:53 AM

## 2019-02-26 ENCOUNTER — Inpatient Hospital Stay
Admission: RE | Admit: 2019-02-26 | Discharge: 2019-03-24 | Disposition: A | Discharge status: Hospice | Payer: Medicare Other | Source: Ambulatory Visit | Attending: Internal Medicine | Admitting: Internal Medicine

## 2019-02-26 DIAGNOSIS — R059 Cough, unspecified: Principal | ICD-10-CM

## 2019-02-26 DIAGNOSIS — R05 Cough: Principal | ICD-10-CM

## 2019-02-26 DIAGNOSIS — R0989 Other specified symptoms and signs involving the circulatory and respiratory systems: Secondary | ICD-10-CM

## 2019-02-26 LAB — PROTIME-INR
INR: 2.4 — ABNORMAL HIGH (ref 0.8–1.2)
Prothrombin Time: 25.9 seconds — ABNORMAL HIGH (ref 11.4–15.2)

## 2019-02-26 MED ORDER — FUROSEMIDE 20 MG PO TABS: 20.0000 mg | ORAL_TABLET | Freq: Every day | ORAL | Status: DC

## 2019-02-26 MED ORDER — SERTRALINE HCL 50 MG PO TABS: 50.0000 mg | ORAL_TABLET | Freq: Every day | ORAL | Status: DC

## 2019-02-26 MED ORDER — ENSURE ENLIVE PO LIQD: 237.0000 mL | Freq: Two times a day (BID) | ORAL | 12 refills | Status: DC

## 2019-02-26 MED ORDER — ATORVASTATIN CALCIUM 20 MG PO TABS: 20.0000 mg | ORAL_TABLET | Freq: Every day | ORAL | Status: DC

## 2019-02-26 MED ORDER — WARFARIN SODIUM 2.5 MG PO TABS: 2.5000 mg | ORAL_TABLET | Freq: Once | ORAL | Status: DC

## 2019-02-26 MED ORDER — ALPRAZOLAM 0.25 MG PO TABS: 0.2500 mg | ORAL_TABLET | Freq: Two times a day (BID) | ORAL | 0 refills | Status: DC

## 2019-02-26 MED ORDER — ACETAMINOPHEN 325 MG PO TABS: 650.0000 mg | ORAL_TABLET | Freq: Four times a day (QID) | ORAL | Status: AC | PRN

## 2019-02-26 MED ORDER — GUAIFENESIN 100 MG/5ML PO SOLN: 5.0000 mL | ORAL | 0 refills | Status: DC

## 2019-02-26 MED ORDER — AMOXICILLIN 500 MG PO CAPS: 500.0000 mg | ORAL_CAPSULE | Freq: Three times a day (TID) | ORAL | Status: DC

## 2019-02-26 MED ORDER — ADULT MULTIVITAMIN W/MINERALS CH: 1.0000 | ORAL_TABLET | Freq: Every day | ORAL | Status: AC

## 2019-02-26 MED ORDER — PANTOPRAZOLE SODIUM 40 MG PO TBEC: 40.0000 mg | DELAYED_RELEASE_TABLET | Freq: Every day | ORAL | Status: AC

## 2019-02-26 NOTE — Progress Notes (Signed)
ANTICOAGULATION CONSULT NOTE -  Pharmacy Consult for  Warfarin dosing Indication: atrial fibrillation  Allergies  Allergen Reactions  . Sulfonamide Derivatives     Patient says his wife says he's allergic to sulfa.    Patient Measurements: Height: 5\' 11"  (180.3 cm) Weight: 154 lb 8.7 oz (70.1 kg) IBW/kg (Calculated) : 75.3 Heparin Dosing Weight:    Vital Signs: Temp: 97.8 F (36.6 C) (04/30 0437) Temp Source: Oral (04/30 0437) BP: 127/71 (04/30 0437) Pulse Rate: 79 (04/30 0437)  Labs: Recent Labs    02/23/19 1739 02/24/19 0510 02/25/19 0424 02/26/19 0434  HGB 10.8* 10.8* 10.5*  --   HCT 34.8* 36.4* 34.5*  --   PLT 254 267 282  --   LABPROT 26.1* 25.9* 29.5* 25.9*  INR 2.4* 2.4* 2.9* 2.4*  CREATININE 1.20 1.29* 1.31*  --   TROPONINI <0.03  --   --   --    Estimated Creatinine Clearance: 38.6 mL/min (A) (by C-G formula based on SCr of 1.31 mg/dL (H)).  Medical History: Past Medical History:  Diagnosis Date  . Atrial fibrillation ([place])   . Cancer ([place])   . Coronary atherosclerosis of native coronary artery    Prior stenting of LAD and diagonal at [facility] with subsequent SVG to diagonal, LVEF 55-60%  . Dysphagia   . GERD   . Hiccups   . History of prostate cancer   . Mixed hyperlipidemia   . Weight loss      Assessment: Pharmacy consulted to dose warfarin for this 85 yo  male on chronic anti-coagulation with warfarin for atrial fibrillation.  CBC is WNL.  Home dose: warfarin 3mg  daily  INR decrease from 2.9 to 2.4 - will be cautious with dosing.    Goal of Therapy:  INR 2-3 Monitor platelets by anticoagulation protocol: Yes   Plan:  Warfarin 2.5mg  po x 1 dose  Monitor CBC and INR  Thomasenia Sales, PharmD, MBA, BCGP Clinical Pharmacist   02/26/2019 8:21 AM

## 2019-02-26 NOTE — Progress Notes (Signed)
Physical Therapy Treatment Patient Details Name: Sergio Boyle MRN: [id number] DOB: 1928/08/03 Today's Date: 02/26/2019    History of Present Illness Sergio Boyle is a 85 y.o. male with medical history significant for coronary artery disease status post CABG in 1996, hyperlipidemia, chronic atrial fibrillation chronically anticoagulated on warfarin, who is admitted to [facility] on 02/23/2019 with suspected acutely decompensated heart failure after presenting from home to [facility] ED complaining of generalized weakness.    PT Comments    Need for bowel movement, mod assistance required for bed mobility and transfer training.  Pt limited by LE weakness and fatigue with task, blocked knees from buckling to BSC.  Able to make small short sidesteps front of bed then rotated to sit on BSC.  Pt requested to return to bed at EOS due to fatigue and reports of LBP and uncomfortable on chair.  Pt left with call bell within reach and chair alarm set.    Follow Up Recommendations  SNF     Equipment Recommendations  None recommended by PT    Recommendations for Other Services       Precautions / Restrictions Precautions Precautions: Fall Restrictions Weight Bearing Restrictions: No    Mobility  Bed Mobility Overal bed mobility: Needs Assistance Bed Mobility: Supine to Sit     Supine to sit: Mod assist     General bed mobility comments: slow labored movement  Transfers   Equipment used: Rolling walker (2 wheeled) Transfers: Sit to/from Stand Sit to Stand: Mod assist Stand pivot transfers: Mod assist       General transfer comment: labored movement, buckling of knees due to weakness  Ambulation/Gait   Gait Distance (Feet): 5 Feet Assistive device: Rolling walker (2 wheeled);1 person hand held assist Gait Pattern/deviations: Decreased step length - right;Decreased step length - left;Decreased stride length Gait velocity: slow   General Gait Details: limited to 5-6 slow unsteady  steps at bedside due to BLE weakness, small sidesteps front be bed to [facility]   Stairs             Wheelchair Mobility    Modified Rankin (Stroke Patients Only)       Balance                                            Cognition Arousal/Alertness: Awake/alert Behavior During Therapy: WFL for tasks assessed/performed Overall Cognitive Status: Within Functional Limits for tasks assessed                                        Exercises      General Comments        Pertinent Vitals/Pain Pain Assessment: 0-10 Pain Score: 2  Pain Location: chronic low back Pain Descriptors / Indicators: Aching Pain Intervention(s): Monitored during session;Limited activity within patient's tolerance(RN aware of pain)    Home Living                      Prior Function            PT Goals (current goals can now be found in the care plan section)      Frequency    Min 3X/week      PT Plan      Co-evaluation  AM-PAC PT "6 Clicks" Mobility   Outcome Measure  Help needed turning from your back to your side while in a flat bed without using bedrails?: A Little Help needed moving from lying on your back to sitting on the side of a flat bed without using bedrails?: A Lot Help needed moving to and from a bed to a chair (including a wheelchair)?: A Lot Help needed standing up from a chair using your arms (e.g., wheelchair or bedside chair)?: A Lot Help needed to walk in hospital room?: A Lot Help needed climbing 3-5 steps with a railing? : Total 6 Click Score: 12    End of Session Equipment Utilized During Treatment: Gait belt Activity Tolerance: Patient tolerated treatment well;Patient limited by fatigue Patient left: in bed;with call bell/phone within reach;with bed alarm set;with nursing/sitter in room(NT in room at EOS) Nurse Communication: Mobility status PT Visit Diagnosis: Unsteadiness on feet (R26.81);Other  abnormalities of gait and mobility (R26.89);Muscle weakness (generalized) (M62.81)     Time: [id number]([id number]) PT Time Calculation (min) (ACUTE ONLY): 44 min  Charges:  $Therapeutic Activity: 23-37 mins                     [address] LPTA; CBIS [phone]  Aldona Lento 02/26/2019, 1:18 PM

## 2019-02-26 NOTE — TOC Transition Note (Signed)
Transition of Care [facility]) - CM/SW Discharge Note   Patient Details  Name: Sergio Boyle MRN: [id number] Date of Birth: 1928/08/22  [facility] CM/SW Contact:  Boneta Lucks, RN Phone Number: 02/26/2019, 9:23 AM   Clinical Narrative:  Patient ready for discharge today, going to [facility]. Called facility, family. Notes and orders sent to facility, Call RN to call report to facility and arrange transfer time. With [facility] staff.   Final next level of care: [place]     Patient Goals and CMS Choice Patient states their goals for this hospitalization and ongoing recovery are:: Patient wishing to go to SNF for short term then home. Patient's daughter and son-in-Law moved back home to care for him. CMS Medicare.gov Compare Post Acute Care list provided to:: Patient Represenative (must comment)(Donna Romeo Apple- daughter) Choice offered to / list presented to : Patient, Adult Children  Discharge Placement              Patient chooses bed at: [facility] Patient to be transferred to facility by: [facility] staff Name of family member notified: [name] - daughter Patient and family notified of of transfer: 02/26/19  Discharge Plan and Services   Discharge Planning Services: CM Consult Post Acute Care Choice: [place]                                Readmission Risk Interventions No flowsheet data found.

## 2019-02-26 NOTE — Progress Notes (Signed)
PT TRANSFERRED TO [facility], VS STABLE, DENIES C/O PAIN, PT REMAINS ACUTELY WEAK, PT LEFT FLOOR VIA WHEELCHAIR STABLE WITH BELONGING ACCOMPANIED BY NURSING CENTER STAFF.

## 2019-02-26 NOTE — Progress Notes (Signed)
He still feels bad.  He has weakness concerns about his vision concerns about his hearing and generally just does not feel well.  He is clearly depressed and he has been started on medications.  He is ready for transfer to skilled care facility for rehab which will be done today.

## 2019-02-26 NOTE — Discharge Summary (Signed)
Physician Discharge Summary  Patient ID: Sergio Boyle MRN: [id number] DOB/AGE: July 08, 1928 85 y.o. Primary Care Physician:Catlynn Grondahl, Percell Miller, MD Admit date: 02/23/2019 Discharge date: 02/26/2019    Discharge Diagnoses:   Principal Problem:   Acute diastolic CHF (congestive heart failure) (HCC) Active Problems:   Mixed hyperlipidemia   ATRIAL FIBRILLATION, CHRONIC   GERD   Generalized weakness Anxiety Depression Coronary artery occlusive disease Enterococcus urinary tract infection  Allergies as of 02/26/2019      Reactions   Sulfonamide Derivatives    Patient says his wife says he's allergic to sulfa.      Medication List    STOP taking these medications   cephALEXin 500 MG capsule Commonly known as:  KEFLEX   digoxin 0.125 MG tablet Commonly known as:  LANOXIN   guaiFENesin 600 MG 12 hr tablet Commonly known as:  MUCINEX Replaced by:  guaiFENesin 100 MG/5ML Soln   magnesium oxide 400 MG tablet Commonly known as:  Mag-Ox 400   oxyCODONE-acetaminophen 5-325 MG tablet Commonly known as:  PERCOCET/ROXICET     TAKE these medications   acetaminophen 325 MG tablet Commonly known as:  TYLENOL Take 2 tablets (650 mg total) by mouth every 6 (six) hours as needed for mild pain (or Fever >/= 101).   ALPRAZolam 0.25 MG tablet Commonly known as:  XANAX Take 1 tablet (0.25 mg total) by mouth 2 (two) times daily. What changed:    medication strength  how much to take   amoxicillin 500 MG capsule Commonly known as:  AMOXIL Take 1 capsule (500 mg total) by mouth every 8 (eight) hours. What changed:    when to take this  additional instructions   atorvastatin 20 MG tablet Commonly known as:  LIPITOR Take 1 tablet (20 mg total) by mouth at bedtime.   cholecalciferol 1000 units tablet Commonly known as:  VITAMIN D Take 1,000 Units by mouth daily.   feeding supplement (ENSURE ENLIVE) Liqd Take 237 mLs by mouth 2 (two) times daily between meals.   furosemide 20  MG tablet Commonly known as:  LASIX Take 1 tablet (20 mg total) by mouth daily.   guaiFENesin 100 MG/5ML Soln Commonly known as:  ROBITUSSIN Take 5 mLs (100 mg total) by mouth every 4 (four) hours while awake. Replaces:  guaiFENesin 600 MG 12 hr tablet   multivitamin with minerals Tabs tablet Take 1 tablet by mouth daily.   nitroGLYCERIN 0.4 MG SL tablet Commonly known as:  NITROSTAT Place 1 tablet (0.4 mg total) under the tongue every 5 (five) minutes as needed.   pantoprazole 40 MG tablet Commonly known as:  PROTONIX Take 1 tablet (40 mg total) by mouth daily before breakfast. What changed:  See the new instructions.   sertraline 50 MG tablet Commonly known as:  ZOLOFT Take 1 tablet (50 mg total) by mouth daily.   SYSTANE BALANCE OP Place 1-2 drops into both eyes daily as needed (for dry eye relief).   vitamin B-12 500 MCG tablet Commonly known as:  CYANOCOBALAMIN Take 500 mcg by mouth daily.   warfarin 3 MG tablet Commonly known as:  COUMADIN Take 3 mg by mouth every evening.       Discharged Condition: Improved    Consults: Cardiology, Dr. Domenic Polite  Significant Diagnostic Studies: Dg Chest 2 View  Result Date: 02/23/2019 CLINICAL DATA:  85 year old male with weakness and inability to walk EXAM: CHEST - 2 VIEW COMPARISON:  08/24/2017, CT 08/13/2018 FINDINGS: Cardiomediastinal silhouette unchanged in size and contour. Surgical  changes of median sternotomy and CABG. Reticular pattern of opacity of the bilateral lungs, worsening since the prior. Interlobular septal thickening. Opacities at the lung bases with blunting of the bilateral costophrenic angles, worse on the right. Meniscus on the lateral view. IMPRESSION: Congestive heart failure superimposed on interstitial fibrosis, and right greater than left pleural effusion. Surgical changes of median sternotomy and CABG Electronically Signed   By: Corrie Mckusick D.O.   On: 02/23/2019 18:33    Lab Results: Basic  Metabolic Panel: Recent Labs    02/23/19 1739 02/24/19 0510 02/25/19 0424  NA 137 138 138  K 5.0 4.7 4.2  CL 104 104 101  CO2 24 25 26   GLUCOSE 126* 107* 111*  BUN 23 20 24*  CREATININE 1.20 1.29* 1.31*  CALCIUM 8.9 9.0 9.0  MG 1.7 1.7  --    Liver Function Tests: No results for input(s): AST, ALT, ALKPHOS, BILITOT, PROT, ALBUMIN in the last 72 hours.   CBC: Recent Labs    02/23/19 1739 02/24/19 0510 02/25/19 0424  WBC 6.9 7.5 7.7  NEUTROABS 4.5  --   --   HGB 10.8* 10.8* 10.5*  HCT 34.8* 36.4* 34.5*  MCV 89.7 91.0 90.6  PLT 254 267 282    Recent Results (from the past 240 hour(s))  SARS Coronavirus 2 [facility] order, Performed in [place] hospital lab)     Status: None   Collection Time: 02/25/19  8:27 AM  Result Value Ref Range Status   SARS Coronavirus 2 NEGATIVE NEGATIVE Final    Comment: (NOTE) If result is NEGATIVE SARS-CoV-2 target nucleic acids are NOT DETECTED. The SARS-CoV-2 RNA is generally detectable in upper and lower  respiratory specimens during the acute phase of infection. The lowest  concentration of SARS-CoV-2 viral copies this assay can detect is 250  copies / mL. A negative result does not preclude SARS-CoV-2 infection  and should not be used as the sole basis for treatment or other  patient management decisions.  A negative result may occur with  improper specimen collection / handling, submission of specimen other  than nasopharyngeal swab, presence of viral mutation(s) within the  areas targeted by this assay, and inadequate number of viral copies  (<250 copies / mL). A negative result must be combined with clinical  observations, patient history, and epidemiological information. If result is POSITIVE SARS-CoV-2 target nucleic acids are DETECTED. The SARS-CoV-2 RNA is generally detectable in upper and lower  respiratory specimens dur ing the acute phase of infection.  Positive  results are indicative of active infection with  SARS-CoV-2.  Clinical  correlation with patient history and other diagnostic information is  necessary to determine patient infection status.  Positive results do  not rule out bacterial infection or co-infection with other viruses. If result is PRESUMPTIVE POSTIVE SARS-CoV-2 nucleic acids MAY BE PRESENT.   A presumptive positive result was obtained on the submitted specimen  and confirmed on repeat testing.  While 2019 novel coronavirus  (SARS-CoV-2) nucleic acids may be present in the submitted sample  additional confirmatory testing may be necessary for epidemiological  and / or clinical management purposes  to differentiate between  SARS-CoV-2 and other Sarbecovirus currently known to infect humans.  If clinically indicated additional testing with an alternate test  methodology [phone]) is advised. The SARS-CoV-2 RNA is generally  detectable in upper and lower respiratory sp ecimens during the acute  phase of infection. The expected result is Negative. Fact Sheet for Patients:  StrictlyIdeas.no Fact  Sheet for Healthcare Providers: BankingDealers.co.za This test is not yet approved or cleared by the [place] and has been authorized for detection and/or diagnosis of SARS-CoV-2 by FDA under an Emergency Use Authorization (EUA).  This EUA will remain in effect (meaning this test can be used) for the duration of the COVID-19 declaration under Section 564(b)(1) of the Act, 21 U.S.C. section 360bbb-3(b)(1), unless the authorization is terminated or revoked sooner. Performed at [facility], [address], [place], [place] [address]      Hospital Course: This is an 85 year old who is been at home and who has his wife son-in-law and daughter trying to help take care of him.  He has developed a urinary tract infection that is being treated but has become weaker and weaker over time to the point that he is not able to get out of bed.   He was brought to the emergency department found to have heart failure which is new and was admitted.  He was diuresed.  He had cardiology consultation.  He had echocardiogram that showed good systolic function.  He was depressed and started on medication.  Amoxicillin was continued.  He had PT consult and it was felt that he needed to be sent to a skilled care facility and that was accomplished.  Discharge Exam: Blood pressure 127/71, pulse 79, temperature 97.8 F (36.6 C), temperature source Oral, resp. rate 16, height 5\' 11"  (1.803 m), weight 70.1 kg, SpO2 97 %. He is awake and alert.  He is weak.  He is in atrial fib.  Disposition: To skilled care facility.  He will have PT OT and speech as needed.  He will be on mechanical soft diet.  He will continue Coumadin 3 mg at bedtime and have daily PT/INR for the next 5 days.  This can be managed by pharmacy at the facility if available or by the accepting facility physician.  He needs to take 5 more days of amoxicillin and he will be completed.  Oxygen at 2 L as needed.  He will need to have basic metabolic profile on 03/01/85.    Greater than 30 minutes were spent on this discharge activity     Contact information for follow-up providers    Satira Sark, MD Follow up.   Specialty:  Cardiology Why:  Keep scheduled Cardiology follow-up in 04/2019. Contact information: [place] [address] [phone]            Contact information for after-discharge care    [facility] .   Service:  Skilled Nursing Contact information: [address] [place] [phone]                  Signed: Alonza Bogus   02/26/2019, 9:00 AM

## 2019-02-27 ENCOUNTER — Encounter: Payer: Self-pay | Admitting: Adult Health

## 2019-02-27 ENCOUNTER — Non-Acute Institutional Stay (SKILLED_NURSING_FACILITY): Payer: Medicare Other | Admitting: Adult Health

## 2019-02-27 ENCOUNTER — Other Ambulatory Visit: Payer: Self-pay | Admitting: Adult Health

## 2019-02-27 DIAGNOSIS — K219 Gastro-esophageal reflux disease without esophagitis: Secondary | ICD-10-CM

## 2019-02-27 DIAGNOSIS — R410 Disorientation, unspecified: Secondary | ICD-10-CM | POA: Diagnosis not present

## 2019-02-27 DIAGNOSIS — I4821 Permanent atrial fibrillation: Secondary | ICD-10-CM

## 2019-02-27 DIAGNOSIS — R0989 Other specified symptoms and signs involving the circulatory and respiratory systems: Secondary | ICD-10-CM | POA: Diagnosis not present

## 2019-02-27 DIAGNOSIS — I482 Chronic atrial fibrillation, unspecified: Secondary | ICD-10-CM | POA: Diagnosis not present

## 2019-02-27 DIAGNOSIS — Z741 Need for assistance with personal care: Secondary | ICD-10-CM | POA: Diagnosis not present

## 2019-02-27 DIAGNOSIS — E785 Hyperlipidemia, unspecified: Secondary | ICD-10-CM | POA: Diagnosis not present

## 2019-02-27 DIAGNOSIS — D649 Anemia, unspecified: Secondary | ICD-10-CM

## 2019-02-27 DIAGNOSIS — N390 Urinary tract infection, site not specified: Secondary | ICD-10-CM

## 2019-02-27 DIAGNOSIS — I5031 Acute diastolic (congestive) heart failure: Secondary | ICD-10-CM | POA: Diagnosis not present

## 2019-02-27 DIAGNOSIS — R1310 Dysphagia, unspecified: Secondary | ICD-10-CM | POA: Diagnosis not present

## 2019-02-27 DIAGNOSIS — F339 Major depressive disorder, recurrent, unspecified: Secondary | ICD-10-CM

## 2019-02-27 DIAGNOSIS — E782 Mixed hyperlipidemia: Secondary | ICD-10-CM

## 2019-02-27 DIAGNOSIS — I25709 Atherosclerosis of coronary artery bypass graft(s), unspecified, with unspecified angina pectoris: Secondary | ICD-10-CM

## 2019-02-27 DIAGNOSIS — R05 Cough: Secondary | ICD-10-CM | POA: Diagnosis not present

## 2019-02-27 DIAGNOSIS — R627 Adult failure to thrive: Secondary | ICD-10-CM | POA: Diagnosis not present

## 2019-02-27 DIAGNOSIS — I5032 Chronic diastolic (congestive) heart failure: Secondary | ICD-10-CM | POA: Diagnosis not present

## 2019-02-27 DIAGNOSIS — M6281 Muscle weakness (generalized): Secondary | ICD-10-CM | POA: Diagnosis not present

## 2019-02-27 DIAGNOSIS — R262 Difficulty in walking, not elsewhere classified: Secondary | ICD-10-CM | POA: Diagnosis not present

## 2019-02-27 DIAGNOSIS — Z7901 Long term (current) use of anticoagulants: Secondary | ICD-10-CM | POA: Diagnosis not present

## 2019-02-27 DIAGNOSIS — R600 Localized edema: Secondary | ICD-10-CM | POA: Diagnosis not present

## 2019-02-27 DIAGNOSIS — K224 Dyskinesia of esophagus: Secondary | ICD-10-CM | POA: Diagnosis not present

## 2019-02-27 DIAGNOSIS — J8410 Pulmonary fibrosis, unspecified: Secondary | ICD-10-CM | POA: Diagnosis not present

## 2019-02-27 DIAGNOSIS — Z951 Presence of aortocoronary bypass graft: Secondary | ICD-10-CM | POA: Diagnosis not present

## 2019-02-27 DIAGNOSIS — I2510 Atherosclerotic heart disease of native coronary artery without angina pectoris: Secondary | ICD-10-CM | POA: Diagnosis not present

## 2019-02-27 DIAGNOSIS — R29898 Other symptoms and signs involving the musculoskeletal system: Secondary | ICD-10-CM | POA: Diagnosis not present

## 2019-02-27 DIAGNOSIS — R531 Weakness: Secondary | ICD-10-CM | POA: Diagnosis not present

## 2019-02-27 DIAGNOSIS — Z743 Need for continuous supervision: Secondary | ICD-10-CM | POA: Diagnosis not present

## 2019-02-27 MED ORDER — ALPRAZOLAM 0.25 MG PO TABS: 0.2500 mg | ORAL_TABLET | Freq: Two times a day (BID) | ORAL | 0 refills | Status: DC

## 2019-02-27 NOTE — Progress Notes (Signed)
Location:   [place] Room Number: 159 P Place of Service:  SNF (31)   CODE STATUS: Full Code  Allergies  Allergen Reactions  . Sulfamethoxazole     Other reaction(s): Red Man Syndrome (ALLERGY)  . Sulfonamide Derivatives     Patient says his wife says he's allergic to sulfa.    Chief Complaint  Patient presents with  . Hospitalization Follow-up    Hospital follow up    HPI:  He is a 85 year old man who has been hospitalized from 02-23-19 through 02-26-19. He taken to the ED after being treated for an uti; became weaker and could not get out of bed. He was to have new onset chf. He had long term afib on chronic coumadin therapy. He is here for short term rehab with his goal to return back home.  He has family support at home; he has a wheelchair; he will need bsc and probably 02 therapy. He does have 3 steps to get into his house. He has chronic back pain; has a chronic cough with sputum production. He denies any changes in appetite; does have fatigue. There are no reports of fevers present. He will continue to be followed for his chronic illnesses including: gerd hyperlipidemia; afib.   Past Medical History:  Diagnosis Date  . Atrial fibrillation ([place])   . Cancer ([place])   . Coronary atherosclerosis of native coronary artery    Prior stenting of LAD and diagonal at [facility] with subsequent SVG to diagonal, LVEF 55-60%  . Dysphagia   . GERD   . Hiccups   . History of prostate cancer   . Mixed hyperlipidemia   . Weight loss     Past Surgical History:  Procedure Laterality Date  . Bilateral inguinal hernia repair    . bulging disc    . COLONOSCOPY  06/2010  . CORONARY ARTERY BYPASS GRAFT  1996    Emergent SVG to diagonal at [facility]  . ESOPHAGEAL DILATION N/A 06/10/2015   Procedure: ESOPHAGEAL DILATION;  Surgeon: Rogene Houston, MD;  Location: [facility];  Service: Endoscopy;  Laterality: N/A;  . ESOPHAGOGASTRODUODENOSCOPY     w/esophageal dilation    . ESOPHAGOGASTRODUODENOSCOPY N/A 06/10/2015   Procedure: ESOPHAGOGASTRODUODENOSCOPY (EGD);  Surgeon: Rogene Houston, MD;  Location: [facility];  Service: Endoscopy;  Laterality: N/A;  245  . IR KYPHO LUMBAR INC FX REDUCE BONE BX UNI/BIL CANNULATION INC/IMAGING  03/07/2017  . IR RADIOLOGIST EVAL & MGMT  02/28/2017  . UPPER GASTROINTESTINAL ENDOSCOPY  06/2010    Social History   Socioeconomic History  . Marital status: Married    Spouse name: Not on file  . Number of children: Not on file  . Years of education: Not on file  . Highest education level: Not on file  Occupational History  . Not on file  Social Needs  . Financial resource strain: Patient refused  . Food insecurity:    Worry: Patient refused    Inability: Patient refused  . Transportation needs:    Medical: Patient refused    Non-medical: Patient refused  Tobacco Use  . Smoking status: Never Smoker  . Smokeless tobacco: Never Used  Substance and Sexual Activity  . Alcohol use: No    Alcohol/week: 0.0 standard drinks  . Drug use: No  . Sexual activity: Not on file  Lifestyle  . Physical activity:    Days per week: Patient refused    Minutes per session: Patient  refused  . Stress: Patient refused  Relationships  . Social connections:    Talks on phone: Patient refused    Gets together: Patient refused    Attends religious service: Patient refused    Active member of club or organization: Patient refused    Attends meetings of clubs or organizations: Patient refused    Relationship status: Patient refused  . Intimate partner violence:    Fear of current or ex partner: Patient refused    Emotionally abused: Patient refused    Physically abused: Patient refused    Forced sexual activity: Patient refused  Other Topics Concern  . Not on file  Social History Narrative   [name]. Has 1 child from 1st marriage and 2 step children from second.   Family History  Problem Relation Age of Onset  .  Coronary artery disease Father       VITAL SIGNS BP (!) 99/59   Pulse 87   Temp (!) 97.1 F (36.2 C)   Resp 19   Ht 5\' 11"  (1.803 m)   Wt 154 lb 14.4 oz (70.3 kg)   BMI 21.60 kg/m   Outpatient Encounter Medications as of 02/27/2019  Medication Sig  . acetaminophen (TYLENOL) 325 MG tablet Take 2 tablets (650 mg total) by mouth every 6 (six) hours as needed for mild pain (or Fever >/= 101).  Marland Kitchen ALPRAZolam (XANAX) 0.25 MG tablet Take 1 tablet (0.25 mg total) by mouth 2 (two) times daily.  Marland Kitchen amoxicillin (AMOXIL) 500 MG capsule Take 1 capsule (500 mg total) by mouth every 8 (eight) hours.  Marland Kitchen atorvastatin (LIPITOR) 20 MG tablet Take 1 tablet (20 mg total) by mouth at bedtime.  . cholecalciferol (VITAMIN D) 1000 UNITS tablet Take 1,000 Units by mouth daily.  . cyanocobalamin 500 MCG tablet Take 500 mcg by mouth daily.  . feeding supplement, ENSURE ENLIVE, (ENSURE ENLIVE) LIQD Take 237 mLs by mouth 2 (two) times daily between meals.  . furosemide (LASIX) 20 MG tablet Take 1 tablet (20 mg total) by mouth daily.  Marland Kitchen guaiFENesin (ROBITUSSIN) 100 MG/5ML SOLN Take 5 mLs (100 mg total) by mouth every 4 (four) hours while awake.  . Multiple Vitamin (MULTIVITAMIN WITH MINERALS) TABS tablet Take 1 tablet by mouth daily.  . nitroGLYCERIN (NITROSTAT) 0.4 MG SL tablet Place 1 tablet (0.4 mg total) under the tongue every 5 (five) minutes as needed.  . OXYGEN Inhale 2 L/min into the lungs as needed. For SOB, dyspnea or sats < 90  . pantoprazole (PROTONIX) 40 MG tablet Take 1 tablet (40 mg total) by mouth daily before breakfast.  . Propylene Glycol (SYSTANE BALANCE OP) Place 2 drops into both eyes daily as needed (for dry eye relief).   . sertraline (ZOLOFT) 50 MG tablet Take 1 tablet (50 mg total) by mouth daily.  Marland Kitchen warfarin (COUMADIN) 3 MG tablet Take 3 mg by mouth every evening.    No facility-administered encounter medications on file as of 02/27/2019.      SIGNIFICANT DIAGNOSTIC EXAMS  TODAY;    02-23-19: chest x-ray: Congestive heart failure superimposed on interstitial fibrosis, and right greater than left pleural effusion. Surgical changes of median sternotomy and CABG  02-24-19: 2-d echo:   1. The left ventricle has normal systolic function, with an ejection fraction of 55-60%. There is anterosepatal hypokinesis. The cavity size was normal. There is mildly increased left ventricular wall thickness. Left ventricular diastolic Doppler parameters are indeterminate. There is right ventricular volume overload.  2. The right ventricle has normal systolic function. The cavity was normal. There is no increase in right ventricular wall thickness. Right ventricular systolic pressure is moderately elevated with an estimated pressure of 52.4 mmHg.  3. Left atrial size was severely dilated.  4. Right atrial size was severely dilated.  5. The aortic valve is tricuspid. Mild calcification of the aortic valve. Aortic valve regurgitation is mild by color flow Doppler. Moderate aortic annular calcification noted.  6. The mitral valve is grossly normal. Mild thickening of the mitral valve leaflet. There is mild mitral annular calcification present.  7. The tricuspid valve is grossly normal. There is mild tricuspid regurgitation.  8. The aortic root is normal in size and structure.  9. The inferior vena cava was dilated in size with >50% respiratory variability.  LABS REVIEWED TODAY:   04-12-18: chol 130; ldl 75 trig 121; hdl 31  02-23-19: wbc 6.9; hgb 10.8; hct 34.8; mcv 89.7; plt 254; glucose 126; bun 23; creat 1.20 ;k+ 5.0; na++ 137; ca 8.9; mag 1.7 BNP 225.0; INR 2.4 02-24-19: tsh 1.106 dig 0.7  02-25-19: wbc 7.7; hgb 10.5; hct 34.5; mcv 90.6 plt 282; glucose 111; bun 24; creat 1.31; k+ 4.2; na++ 138; ca 9.0;  02-26-19: INR 2.4  Review of Systems  Constitutional: Positive for malaise/fatigue.  Respiratory: Positive for cough and sputum production. Negative for shortness of breath.        For the past  6 months Now requires prn 02   Cardiovascular: Negative for chest pain, palpitations and leg swelling.  Gastrointestinal: Negative for abdominal pain, constipation and heartburn.  Musculoskeletal: Positive for back pain. Negative for joint pain and myalgias.  Skin: Negative.   Neurological: Negative for dizziness.  Psychiatric/Behavioral: The patient is not nervous/anxious.     Physical Exam Constitutional:      General: He is not in acute distress.    Appearance: He is well-developed. He is not diaphoretic.     Comments: Thin   Eyes:     Comments: History of bilateral cataract removal and lens implant   Neck:     Musculoskeletal: Neck supple.     Thyroid: No thyromegaly.  Cardiovascular:     Rate and Rhythm: Normal rate. Rhythm irregular.     Pulses: Normal pulses.     Heart sounds: Normal heart sounds.     Comments: History of CABG Pulmonary:     Effort: Pulmonary effort is normal. No respiratory distress.     Breath sounds: Normal breath sounds.  Abdominal:     General: Bowel sounds are normal. There is no distension.     Palpations: Abdomen is soft.     Tenderness: There is no abdominal tenderness.     Comments: History of esophageal dilatation   Musculoskeletal: Normal range of motion.     Right lower leg: No edema.     Left lower leg: No edema.     Comments: Slight kyphosis  History of lumbar surgery   Lymphadenopathy:     Cervical: No cervical adenopathy.  Skin:    General: Skin is warm and dry.  Neurological:     Mental Status: He is alert and oriented to person, place, and time.  Psychiatric:        Mood and Affect: Mood normal.     ASSESSMENT/ PLAN:  TODAY:   1. Acute diastolic CHF (congestive heart failure) EF 55-60% (02-24-19): will continue lasix 20 mg daily   2. Coronary artery disease involving coronary artery bypass  graft of native heart with angina pectoris: is stable is on long term coumadin therapy and has prn ntg  3. Interstitial pulmonary  fibrosis (on chest x-ray 02-23-19) is without change: has chronic cough with sputum will continue robitussin 100 mg every 4 hours while awake has 02 prn.   4. GERD without esophagitis: is stable will continue protonix 40 mg daily   5. Mixed hyperlipidemia: is stable ldl 75 will continue lipitor 20 mg daily   6. Permanent atrial fibrillation: heart rate stable: is on long term coumadin therapy   7. Chronic recurrent major depressive disorder: is without change: will continue zoloft 50 mg daily and xanax 0.25 mg twice daily   8. UTI: is stable will continue amoxicillin 500 mg every 8 hours and will monitor  10. Chronic anemia: hgb 10.5 is stable will monitor  Will check bmp and INR on 03-02-19        MD is aware of resident's narcotic use and is in agreement with current plan of care. We will attempt to wean resident as apropriate   Ok Edwards NP [facility]  Contact [phone] Monday through Friday 8am- 5pm  After hours call [phone]

## 2019-03-02 ENCOUNTER — Encounter (HOSPITAL_COMMUNITY)
Admission: RE | Admit: 2019-03-02 | Discharge: 2019-03-02 | Disposition: A | Discharge status: Home / Self Care | Payer: Medicare Other | Source: Skilled Nursing Facility | Attending: Adult Health | Admitting: Adult Health

## 2019-03-02 ENCOUNTER — Encounter: Payer: Self-pay | Admitting: Internal Medicine

## 2019-03-02 ENCOUNTER — Non-Acute Institutional Stay (SKILLED_NURSING_FACILITY): Payer: Medicare Other | Admitting: Internal Medicine

## 2019-03-02 DIAGNOSIS — I4821 Permanent atrial fibrillation: Secondary | ICD-10-CM | POA: Diagnosis not present

## 2019-03-02 DIAGNOSIS — F339 Major depressive disorder, recurrent, unspecified: Secondary | ICD-10-CM

## 2019-03-02 DIAGNOSIS — I5041 Acute combined systolic (congestive) and diastolic (congestive) heart failure: Secondary | ICD-10-CM | POA: Insufficient documentation

## 2019-03-02 DIAGNOSIS — K219 Gastro-esophageal reflux disease without esophagitis: Secondary | ICD-10-CM | POA: Diagnosis not present

## 2019-03-02 DIAGNOSIS — D649 Anemia, unspecified: Secondary | ICD-10-CM

## 2019-03-02 DIAGNOSIS — J8410 Pulmonary fibrosis, unspecified: Secondary | ICD-10-CM

## 2019-03-02 DIAGNOSIS — N390 Urinary tract infection, site not specified: Secondary | ICD-10-CM | POA: Diagnosis not present

## 2019-03-02 DIAGNOSIS — I5031 Acute diastolic (congestive) heart failure: Secondary | ICD-10-CM

## 2019-03-02 DIAGNOSIS — Z1159 Encounter for screening for other viral diseases: Secondary | ICD-10-CM | POA: Insufficient documentation

## 2019-03-02 LAB — BASIC METABOLIC PANEL
Anion gap: 12 (ref 5–15)
BUN: 24 mg/dL — ABNORMAL HIGH (ref 8–23)
CO2: 25 mmol/L (ref 22–32)
Calcium: 9.2 mg/dL (ref 8.9–10.3)
Chloride: 94 mmol/L — ABNORMAL LOW (ref 98–111)
Creatinine, Ser: 1.15 mg/dL (ref 0.61–1.24)
GFR calc Af Amer: >60 mL/min (ref >60)
GFR calc non Af Amer: 57 mL/min — ABNORMAL LOW (ref >60)
Glucose, Bld: 113 mg/dL — ABNORMAL HIGH (ref 70–99)
Potassium: 4.6 mmol/L (ref 3.5–5.1)
Sodium: 131 mmol/L — ABNORMAL LOW (ref 135–145)

## 2019-03-02 LAB — PROTIME-INR
INR: 2.4 — ABNORMAL HIGH (ref 0.8–1.2)
Prothrombin Time: 25.4 seconds — ABNORMAL HIGH (ref 11.4–15.2)

## 2019-03-02 NOTE — Progress Notes (Signed)
Provider:  Veleta Miners, MD Location:  [facility] Room Number: 159 P Place of Service:  SNF (31)  PCP: Sinda Du, MD Patient Care Team: Sinda Du, MD as PCP - General (Pulmonary Disease) Satira Sark, MD as PCP - Cardiology (Cardiology)  Extended Emergency Contact Information Primary Emergency Contact: Docia [place] Phone: [phone] Mobile Phone: [phone] Relation: Daughter Secondary Emergency Contact: Collins [place] Mobile Phone: [phone] Relation: Son  Code Status: Full Code Goals of Care: Advanced Directive information Advanced Directives 03/02/2019  Does Patient Have a Medical Advance Directive? No  Would patient like information on creating a medical advance directive? No - Patient declined      Chief Complaint  Patient presents with  . New Admit To SNF    Admission    HPI: Patient is a 85 y.o. male seen today for admission to SNF for therapy.  Patient has a history of CAD status post CABG , hyperlipidemia , chronic atrial fibrillation on Coumadin, H/o Esophageal Dysphagia S/P barium in 2016 showed Severe Esophageal Dysmotility    Patient says he is been not able to do much for himself for past few months but for last few weeks he was getting progressively more weaker and his family was unable to take care of him.  He was diagnosed with UTI initially and was treated with Keflex as outpatient. But then his family brought him to ED. In ED he also complained of edema in his LE and had CHF on his x-ray.  He was admitted with acute CHF. His EF is 55 to 60% with biatrial dilatation. He was seen by cardiology they discontinued his Digoxin and diuresed him with Lasix.  For his UTI he grew enterococcus and was changed from Keflex to ampicillin. He also was seen by Speech for his Dysphagia He had Bed side Eval done. They recommended D3 Diet Patient is now in SNF for therapy. He is complaining of  feeling weak and nauseated.  He also complained of dysphagia. Patient also had few episodes of Loose stools per Nurses   Denies any cough or shortness of breath.  Denies any chest pain. Patient says that he has done home therapy before.  But at home he is was progressively getting weak and is now unable to ambulate.He lives with his wife and daughter and son-in-law   Past Medical History:  Diagnosis Date  . Atrial fibrillation ([place])   . Cancer ([place])   . Coronary atherosclerosis of native coronary artery    Prior stenting of LAD and diagonal at [facility] with subsequent SVG to diagonal, LVEF 55-60%  . Dysphagia   . GERD   . Hiccups   . History of prostate cancer   . Mixed hyperlipidemia   . Weight loss    Past Surgical History:  Procedure Laterality Date  . Bilateral inguinal hernia repair    . bulging disc    . COLONOSCOPY  06/2010  . CORONARY ARTERY BYPASS GRAFT  1996    Emergent SVG to diagonal at [facility]  . ESOPHAGEAL DILATION N/A 06/10/2015   Procedure: ESOPHAGEAL DILATION;  Surgeon: Rogene Houston, MD;  [facility];  Service: Endoscopy;  Laterality: N/A;  . ESOPHAGOGASTRODUODENOSCOPY     w/esophageal dilation   . ESOPHAGOGASTRODUODENOSCOPY N/A 06/10/2015   Procedure: ESOPHAGOGASTRODUODENOSCOPY (EGD);  Surgeon: Rogene Houston, MD;  Location: [facility];  Service: Endoscopy;  Laterality: N/A;  245  . [place] FX REDUCE  BONE BX UNI/BIL CANNULATION INC/IMAGING  03/07/2017  . IR RADIOLOGIST EVAL & MGMT  02/28/2017  . UPPER GASTROINTESTINAL ENDOSCOPY  06/2010    reports that he has never smoked. He has never used smokeless tobacco. He reports that he does not drink alcohol or use drugs. Social History   Socioeconomic History  . Marital status: Married    Spouse name: Not on file  . Number of children: Not on file  . Years of education: Not on file  . Highest education level: Not on file  Occupational History  . Not on file  Social Needs  . Financial resource  strain: Patient refused  . Food insecurity:    Worry: Patient refused    Inability: Patient refused  . Transportation needs:    Medical: Patient refused    Non-medical: Patient refused  Tobacco Use  . Smoking status: Never Smoker  . Smokeless tobacco: Never Used  Substance and Sexual Activity  . Alcohol use: No    Alcohol/week: 0.0 standard drinks  . Drug use: No  . Sexual activity: Not on file  Lifestyle  . Physical activity:    Days per week: Patient refused    Minutes per session: Patient refused  . Stress: Patient refused  Relationships  . Social connections:    Talks on phone: Patient refused    Gets together: Patient refused    Attends religious service: Patient refused    Active member of club or organization: Patient refused    Attends meetings of clubs or organizations: Patient refused    Relationship status: Patient refused  . Intimate partner violence:    Fear of current or ex partner: Patient refused    Emotionally abused: Patient refused    Physically abused: Patient refused    Forced sexual activity: Patient refused  Other Topics Concern  . Not on file  Social History Narrative   [name]. Has 1 child from 1st marriage and 2 step children from second.    Functional Status Survey:    Family History  Problem Relation Age of Onset  . Coronary artery disease Father     Health Maintenance  Topic Date Due  . TETANUS/TDAP  03/30/2019 (Originally 10/04/1947)  . PNA vac Low Risk Adult (1 of 2 - PCV13) 03/30/2019 (Originally 10/05/1995)  . INFLUENZA VACCINE  05/30/2019    Allergies  Allergen Reactions  . Sulfamethoxazole     Other reaction(s): Red Man Syndrome (ALLERGY)  . Sulfonamide Derivatives     Patient says his wife says he's allergic to sulfa.    Outpatient Encounter Medications as of 03/02/2019  Medication Sig  . acetaminophen (TYLENOL) 325 MG tablet Take 2 tablets (650 mg total) by mouth every 6 (six) hours as needed for mild  pain (or Fever >/= 101).  Marland Kitchen ALPRAZolam (XANAX) 0.25 MG tablet Take 1 tablet (0.25 mg total) by mouth 2 (two) times daily.  Marland Kitchen amoxicillin (AMOXIL) 500 MG capsule Take 1 capsule (500 mg total) by mouth every 8 (eight) hours.  Marland Kitchen atorvastatin (LIPITOR) 20 MG tablet Take 1 tablet (20 mg total) by mouth at bedtime.  Roseanne Kaufman Peru-Castor Oil (VENELEX) OINT Apply externally to sacrum and bilateral buttocks every shift and as needed  . cholecalciferol (VITAMIN D) 1000 UNITS tablet Take 1,000 Units by mouth daily.  . cyanocobalamin 500 MCG tablet Take 500 mcg by mouth daily.  . feeding supplement, ENSURE ENLIVE, (ENSURE ENLIVE) LIQD Take 237 mLs by mouth 2 (two) times daily between  meals.  . furosemide (LASIX) 20 MG tablet Take 1 tablet (20 mg total) by mouth daily.  Marland Kitchen guaiFENesin (ROBITUSSIN) 100 MG/5ML SOLN Take 5 mLs (100 mg total) by mouth every 4 (four) hours while awake.  . Multiple Vitamin (MULTIVITAMIN WITH MINERALS) TABS tablet Take 1 tablet by mouth daily.  . nitroGLYCERIN (NITROSTAT) 0.4 MG SL tablet Place 1 tablet (0.4 mg total) under the tongue every 5 (five) minutes as needed.  . NON FORMULARY Diet Type:  Mechanical soft  . OXYGEN Inhale 2 L/min into the lungs as needed. For SOB, dyspnea or sats < 90  . pantoprazole (PROTONIX) 40 MG tablet Take 1 tablet (40 mg total) by mouth daily before breakfast.  . Propylene Glycol (SYSTANE BALANCE OP) Place 2 drops into both eyes daily as needed (for dry eye relief).   . sertraline (ZOLOFT) 50 MG tablet Take 1 tablet (50 mg total) by mouth daily.  Marland Kitchen warfarin (COUMADIN) 3 MG tablet Take 3 mg by mouth every evening.    No facility-administered encounter medications on file as of 03/02/2019.     Review of Systems  Constitutional: Positive for activity change and appetite change.  HENT: Negative.   Respiratory: Positive for cough.   Cardiovascular: Negative.   Gastrointestinal: Positive for nausea.  Genitourinary: Negative.   Musculoskeletal:  Negative.   Skin: Negative.   Neurological: Positive for weakness and light-headedness.  Psychiatric/Behavioral: Positive for dysphoric mood.    Vitals:   03/02/19 1000  BP: (!) 140/58  Pulse: 79  Resp: 17  Temp: 97.7 F (36.5 C)  Weight: 151 lb 4.8 oz (68.6 kg)  Height: 5\' 10"  (1.778 m)   Body mass index is 21.71 kg/m. Physical Exam Vitals signs reviewed.  Constitutional:      Appearance: Normal appearance.  HENT:     Head: Normocephalic.     Nose: Nose normal.     Mouth/Throat:     Mouth: Mucous membranes are moist.     Pharynx: Oropharynx is clear.  Eyes:     Pupils: Pupils are equal, round, and reactive to light.  Cardiovascular:     Rate and Rhythm: Normal rate and regular rhythm.     Heart sounds: No murmur.  Pulmonary:     Effort: Pulmonary effort is normal. No respiratory distress.     Breath sounds: Normal breath sounds. No wheezing or rales.  Abdominal:     General: Abdomen is flat. Bowel sounds are normal. There is no distension.     Palpations: Abdomen is soft.     Tenderness: There is no abdominal tenderness.  Musculoskeletal:        General: No swelling.  Skin:    General: Skin is warm and dry.  Neurological:     General: No focal deficit present.     Mental Status: He is alert and oriented to person, place, and time.  Psychiatric:        Mood and Affect: Mood normal.        Thought Content: Thought content normal.        Judgment: Judgment normal.     Labs reviewed: Basic Metabolic Panel: Recent Labs    02/23/19 1739 02/24/19 0510 02/25/19 0424 03/02/19 0700  NA 137 138 138 131*  K 5.0 4.7 4.2 4.6  CL 104 104 101 94*  CO2 24 25 26 25   GLUCOSE 126* 107* 111* 113*  BUN 23 20 24* 24*  CREATININE 1.20 1.29* 1.31* 1.15  CALCIUM 8.9 9.0 9.0 9.2  MG 1.7 1.7  --   --    Liver Function Tests: Recent Labs    04/02/18 1031  AST 14  ALT 12  ALKPHOS 107  BILITOT 0.5  PROT 6.8  ALBUMIN 4.0   No results for input(s): LIPASE, AMYLASE  in the last 8760 hours. No results for input(s): AMMONIA in the last 8760 hours. CBC: Recent Labs    02/23/19 1739 02/24/19 0510 02/25/19 0424  WBC 6.9 7.5 7.7  NEUTROABS 4.5  --   --   HGB 10.8* 10.8* 10.5*  HCT 34.8* 36.4* 34.5*  MCV 89.7 91.0 90.6  PLT 254 267 282   Cardiac Enzymes: Recent Labs    02/23/19 1739  TROPONINI <0.03   BNP: Invalid input(s): POCBNP No results found for: HGBA1C Lab Results  Component Value Date   TSH 1.106 02/24/2019   No results found for: VITAMINB12 No results found for: FOLATE No results found for: IRON, TIBC, FERRITIN  Imaging and Procedures obtained prior to SNF admission: No results found.  Assessment/Plan Acute diastolic CHF (congestive heart failure)  Continue same dose of Lasix Will monitor his weight Repeat BMP in 1 week Repeat Chest Xray in 2 weeks to follow his Effusions  Permanent atrial fibrillation Rate Control  Digoxin was stopped On Comadin Same dose  Repeat INR in 1 week  H/O Interstitial pulmonary fibrosis  On Oxygen though says he was not on it at home' Will try to taper it GERD Continue on Protonix  Urinary tract infection  D/C ampicillin as he is having diarrhea and Nausea Send stool for C Diff  Chronic recurrent major depressive disorder  On Zoloft which is new Will make his Xanax PRN  Chronic anemia Hgb Stable Dysphagia His Appetite seems very poor On D3 diet  Will follow in the Facility    Family/ staff Communication:   Labs/tests ordered: BMP And INR in 1 week Also Ordered stools for C DIff  Total time spent in this patient care encounter was  45_  minutes; greater than 50% of the visit spent counseling patient and staff, reviewing records , Labs and coordinating care for problems addressed at this encounter.

## 2019-03-03 ENCOUNTER — Other Ambulatory Visit: Payer: Self-pay | Admitting: Adult Health

## 2019-03-03 MED ORDER — ALPRAZOLAM 0.25 MG PO TABS: 0.2500 mg | ORAL_TABLET | Freq: Two times a day (BID) | ORAL | 0 refills | Status: DC | PRN

## 2019-03-05 ENCOUNTER — Encounter: Payer: Self-pay | Admitting: Adult Health

## 2019-03-05 ENCOUNTER — Non-Acute Institutional Stay (SKILLED_NURSING_FACILITY): Payer: Medicare Other | Admitting: Adult Health

## 2019-03-05 ENCOUNTER — Encounter (HOSPITAL_COMMUNITY)
Admission: RE | Admit: 2019-03-05 | Discharge: 2019-03-05 | Disposition: A | Discharge status: Home / Self Care | Payer: Medicare Other | Source: Skilled Nursing Facility | Attending: *Deleted | Admitting: *Deleted

## 2019-03-05 ENCOUNTER — Ambulatory Visit (HOSPITAL_COMMUNITY): Payer: Medicare Other | Attending: Internal Medicine

## 2019-03-05 DIAGNOSIS — J8410 Pulmonary fibrosis, unspecified: Secondary | ICD-10-CM | POA: Diagnosis not present

## 2019-03-05 DIAGNOSIS — R05 Cough: Secondary | ICD-10-CM | POA: Diagnosis not present

## 2019-03-05 DIAGNOSIS — R0989 Other specified symptoms and signs involving the circulatory and respiratory systems: Secondary | ICD-10-CM | POA: Insufficient documentation

## 2019-03-05 LAB — SARS CORONAVIRUS 2 BY RT PCR (HOSPITAL ORDER, PERFORMED IN ~~LOC~~ HOSPITAL LAB): SARS Coronavirus 2: NEGATIVE

## 2019-03-05 NOTE — Progress Notes (Signed)
Location:   [facility] Room Number: 159 P Place of Service:  SNF (31)   CODE STATUS: Full Code  Allergies  Allergen Reactions  . Sulfamethoxazole     Other reaction(s): Red Man Syndrome (ALLERGY)  . Sulfonamide Derivatives     Patient says his wife says he's allergic to sulfa.    Chief Complaint  Patient presents with  . Acute Visit    Confusion    HPI:  Staff is concerned that he is more confused. He did not recognize his son in law on the phone. He alert and oriented X 3. He tells me that he feels weak and fatigued. He feels that he has been getting weaker for the past week or so. He states that he is not hungry; he scored an 15 on his depression scale. There are no reports of fevers. He does have chronic cough his sputum has changed to tan in color.    Past Medical History:  Diagnosis Date  . Atrial fibrillation ([place])   . Cancer ([place])   . Coronary atherosclerosis of native coronary artery    Prior stenting of LAD and diagonal at [facility] with subsequent SVG to diagonal, LVEF 55-60%  . Dysphagia   . GERD   . Hiccups   . History of prostate cancer   . Mixed hyperlipidemia   . Weight loss     Past Surgical History:  Procedure Laterality Date  . Bilateral inguinal hernia repair    . bulging disc    . COLONOSCOPY  06/2010  . CORONARY ARTERY BYPASS GRAFT  1996    Emergent SVG to diagonal at [facility]  . ESOPHAGEAL DILATION N/A 06/10/2015   Procedure: ESOPHAGEAL DILATION;  Surgeon: Rogene Houston, MD;  Location: [facility];  Service: Endoscopy;  Laterality: N/A;  . ESOPHAGOGASTRODUODENOSCOPY     w/esophageal dilation   . ESOPHAGOGASTRODUODENOSCOPY N/A 06/10/2015   Procedure: ESOPHAGOGASTRODUODENOSCOPY (EGD);  Surgeon: Rogene Houston, MD;  [facility]: [facility];  Service: Endoscopy;  Laterality: N/A;  245  . IR [place] LUMBAR INC FX REDUCE BONE BX UNI/BIL CANNULATION INC/IMAGING  03/07/2017  . IR RADIOLOGIST EVAL & MGMT  02/28/2017  . UPPER  GASTROINTESTINAL ENDOSCOPY  06/2010    Social History   Socioeconomic History  . Marital status: Married    Spouse name: Not on file  . Number of children: Not on file  . Years of education: Not on file  . Highest education level: Not on file  Occupational History  . Not on file  Social Needs  . Financial resource strain: Patient refused  . Food insecurity:    Worry: Patient refused    Inability: Patient refused  . Transportation needs:    Medical: Patient refused    Non-medical: Patient refused  Tobacco Use  . Smoking status: Never Smoker  . Smokeless tobacco: Never Used  Substance and Sexual Activity  . Alcohol use: No    Alcohol/week: 0.0 standard drinks  . Drug use: No  . Sexual activity: Not on file  Lifestyle  . Physical activity:    Days per week: Patient refused    Minutes per session: Patient refused  . Stress: Patient refused  Relationships  . Social connections:    Talks on phone: Patient refused    Gets together: Patient refused    Attends religious service: Patient refused    Active member of club or organization: Patient refused    Attends meetings of clubs or organizations:  Patient refused    Relationship status: Patient refused  . Intimate partner violence:    Fear of current or ex partner: Patient refused    Emotionally abused: Patient refused    Physically abused: Patient refused    Forced sexual activity: Patient refused  Other Topics Concern  . Not on file  Social History Narrative   [name]. Has 1 child from 1st marriage and 2 step children from second.   Family History  Problem Relation Age of Onset  . Coronary artery disease Father       VITAL SIGNS BP 113/79   Pulse 79   Temp 98.3 F (36.8 C)   Resp 16   Ht 5\' 10"  (1.778 m)   Wt 151 lb 6.4 oz (68.7 kg)   BMI 21.72 kg/m   Outpatient Encounter Medications as of 03/05/2019  Medication Sig  . acetaminophen (TYLENOL) 325 MG tablet Take 2 tablets (650 mg total) by  mouth every 6 (six) hours as needed for mild pain (or Fever >/= 101).  Marland Kitchen ALPRAZolam (XANAX) 0.25 MG tablet Take 1 tablet (0.25 mg total) by mouth 2 (two) times daily as needed for up to 13 days for anxiety.  Marland Kitchen atorvastatin (LIPITOR) 20 MG tablet Take 1 tablet (20 mg total) by mouth at bedtime.  Roseanne Kaufman [place]-Castor Oil (VENELEX) OINT Apply externally to sacrum and bilateral buttocks every shift and as needed  . cholecalciferol (VITAMIN D) 1000 UNITS tablet Take 1,000 Units by mouth daily.  . cyanocobalamin 500 MCG tablet Take 500 mcg by mouth daily.  . feeding supplement, ENSURE ENLIVE, (ENSURE ENLIVE) LIQD Take 237 mLs by mouth 2 (two) times daily between meals.  . furosemide (LASIX) 20 MG tablet Take 1 tablet (20 mg total) by mouth daily.  Marland Kitchen guaiFENesin (ROBITUSSIN) 100 MG/5ML SOLN Take 5 mLs by mouth 4 (four) times daily. Eery 4 hours during waking hours  . Multiple Vitamin (MULTIVITAMIN WITH MINERALS) TABS tablet Take 1 tablet by mouth daily.  . nitroGLYCERIN (NITROSTAT) 0.4 MG SL tablet Place 1 tablet (0.4 mg total) under the tongue every 5 (five) minutes as needed.  . NON FORMULARY Diet Type:  Mechanical soft  . Ostomy Supplies (SKIN PREP WIPES) MISC Apply to bilateral heels every shift for prevention  . OXYGEN Inhale 2 L/min into the lungs as needed. For SOB, dyspnea or sats < 90  . pantoprazole (PROTONIX) 40 MG tablet Take 1 tablet (40 mg total) by mouth daily before breakfast.  . Propylene Glycol (SYSTANE BALANCE OP) Place 2 drops into both eyes daily as needed (for dry eye relief).   . sertraline (ZOLOFT) 50 MG tablet Take 1 tablet (50 mg total) by mouth daily.  Marland Kitchen warfarin (COUMADIN) 3 MG tablet Take 3 mg by mouth every evening.   . [DISCONTINUED] amoxicillin (AMOXIL) 500 MG capsule Take 1 capsule (500 mg total) by mouth every 8 (eight) hours. (Patient not taking: Reported on 03/05/2019)  . [DISCONTINUED] guaiFENesin (ROBITUSSIN) 100 MG/5ML SOLN Take 5 mLs (100 mg total) by mouth every 4  (four) hours while awake. (Patient not taking: Reported on 03/05/2019)   No facility-administered encounter medications on file as of 03/05/2019.      SIGNIFICANT DIAGNOSTIC EXAMS  PREVIOUS;   02-23-19: chest x-ray: Congestive heart failure superimposed on interstitial fibrosis, and right greater than left pleural effusion. Surgical changes of median sternotomy and CABG  02-24-19: 2-d echo:   1. The left ventricle has normal systolic function, with an ejection fraction  of 55-60%. There is anterosepatal hypokinesis. The cavity size was normal. There is mildly increased left ventricular wall thickness. Left ventricular diastolic Doppler parameters are indeterminate. There is right ventricular volume overload.  2. The right ventricle has normal systolic function. The cavity was normal. There is no increase in right ventricular wall thickness. Right ventricular systolic pressure is moderately elevated with an estimated pressure of 52.4 mmHg.  3. Left atrial size was severely dilated.  4. Right atrial size was severely dilated.  5. The aortic valve is tricuspid. Mild calcification of the aortic valve. Aortic valve regurgitation is mild by color flow Doppler. Moderate aortic annular calcification noted.  6. The mitral valve is grossly normal. Mild thickening of the mitral valve leaflet. There is mild mitral annular calcification present.  7. The tricuspid valve is grossly normal. There is mild tricuspid regurgitation.  8. The aortic root is normal in size and structure.  9. The inferior vena cava was dilated in size with >50% respiratory variability.  TODAY:   LABS REVIEWED PREVIOUS:   04-12-18: chol 130; ldl 75 trig 121; hdl 31  02-23-19: wbc 6.9; hgb 10.8; hct 34.8; mcv 89.7; plt 254; glucose 126; bun 23; creat 1.20 ;k+ 5.0; na++ 137; ca 8.9; mag 1.7 BNP 225.0; INR 2.4 02-24-19: tsh 1.106 dig 0.7  02-25-19: wbc 7.7; hgb 10.5; hct 34.5; mcv 90.6 plt 282; glucose 111; bun 24; creat 1.31; k+ 4.2; na++  138; ca 9.0;  02-26-19: INR 2.4  TODAY:   03-02-19: glucose 113; bun 24; creat 1.15; k+ 4.6; na++ 131; ca 9.2 INR  2.4    Review of Systems  Constitutional: Positive for malaise/fatigue.  Respiratory: Positive for sputum production. Negative for cough and shortness of breath.        Sputum now tan in color   Cardiovascular: Negative for chest pain, palpitations and leg swelling.  Gastrointestinal: Negative for abdominal pain, constipation and heartburn.  Musculoskeletal: Negative for back pain, joint pain and myalgias.  Skin: Negative.   Neurological: Negative for dizziness.  Psychiatric/Behavioral: Positive for depression. Negative for suicidal ideas. The patient is not nervous/anxious.        Poor appetite      Physical Exam Constitutional:      General: He is not in acute distress.    Appearance: He is well-developed. He is not diaphoretic.     Comments: thin  Eyes:     Comments:  History of bilateral cataract removal and lens implant    Neck:     Musculoskeletal: Neck supple.     Thyroid: No thyromegaly.  Cardiovascular:     Rate and Rhythm: Normal rate. Rhythm irregular.     Heart sounds: Normal heart sounds.     Comments: History of CABG  Pulmonary:     Effort: Pulmonary effort is normal. No respiratory distress.     Breath sounds: Normal breath sounds.  Abdominal:     General: Bowel sounds are normal. There is no distension.     Palpations: Abdomen is soft.     Tenderness: There is no abdominal tenderness.     Comments:  History of esophageal dilatation    Musculoskeletal: Normal range of motion.     Right lower leg: No edema.     Left lower leg: No edema.     Comments: Slight kyphosis  History of lumbar surgery    Lymphadenopathy:     Cervical: No cervical adenopathy.  Skin:    General: Skin is warm and dry.  Neurological:     Mental Status: He is alert and oriented to person, place, and time.  Psychiatric:     Comments: Sad       ASSESSMENT/ PLAN:   TODAY:   1.  Interstitial pulmonary fibrosis Status without change: will get a chest x-ray and will treat further as indicated   MD is aware of resident's narcotic use and is in agreement with current plan of care. We will attempt to wean resident as apropriate   Ok Edwards NP [facility]  Contact [phone] Monday through Friday 8am- 5pm  After hours call [phone]

## 2019-03-06 ENCOUNTER — Non-Acute Institutional Stay (SKILLED_NURSING_FACILITY): Payer: Medicare Other | Admitting: Adult Health

## 2019-03-06 ENCOUNTER — Encounter: Payer: Self-pay | Admitting: Adult Health

## 2019-03-06 DIAGNOSIS — K219 Gastro-esophageal reflux disease without esophagitis: Secondary | ICD-10-CM | POA: Diagnosis not present

## 2019-03-06 DIAGNOSIS — I4821 Permanent atrial fibrillation: Secondary | ICD-10-CM | POA: Diagnosis not present

## 2019-03-06 DIAGNOSIS — E782 Mixed hyperlipidemia: Secondary | ICD-10-CM

## 2019-03-06 NOTE — Progress Notes (Signed)
Location:   [place] Room Number: 159 P Place of Service:  SNF (31)   CODE STATUS: Full Code  Allergies  Allergen Reactions  . Sulfamethoxazole     Other reaction(s): Red Man Syndrome (ALLERGY)  . Sulfonamide Derivatives     Patient says his wife says he's allergic to sulfa.    Chief Complaint  Patient presents with  . Medical Management of Chronic Issues    Gastroesophageal reflux disease without esophagitis; permanent atrial fibrillation; mixed hyperlipidemia  Weekly follow up for the first 30 days post hospitalization.      HPI:  He is a 85 year old short term rehab patient being seen for the management of his chronic illnesses: gerd; afib; hyperlipidemia. He denies any worsening cough or worsening sputum production. He continues to have a poor appetite. He denies any uncontrolled pain no reports of fevers present.   Past Medical History:  Diagnosis Date  . Atrial fibrillation ([place])   . Cancer ([employer])   . Coronary atherosclerosis of native coronary artery    Prior stenting of LAD and diagonal at [facility] with subsequent SVG to diagonal, LVEF 55-60%  . Dysphagia   . GERD   . Hiccups   . History of prostate cancer   . Mixed hyperlipidemia   . Weight loss     Past Surgical History:  Procedure Laterality Date  . Bilateral inguinal hernia repair    . bulging disc    . COLONOSCOPY  06/2010  . CORONARY ARTERY BYPASS GRAFT  1996    Emergent SVG to diagonal at [facility]  . ESOPHAGEAL DILATION N/A 06/10/2015   Procedure: ESOPHAGEAL DILATION;  Surgeon: Rogene Houston, MD;  Location: [facility] ENDO [facility];  Service: Endoscopy;  Laterality: N/A;  . ESOPHAGOGASTRODUODENOSCOPY     w/esophageal dilation   . ESOPHAGOGASTRODUODENOSCOPY N/A 06/10/2015   Procedure: ESOPHAGOGASTRODUODENOSCOPY (EGD);  Surgeon: Rogene Houston, MD;  Location: [facility];  Service: Endoscopy;  Laterality: N/A;  245  . IR KYPHO LUMBAR INC FX REDUCE BONE BX UNI/BIL CANNULATION INC/IMAGING   03/07/2017  . IR RADIOLOGIST EVAL & MGMT  02/28/2017  . UPPER GASTROINTESTINAL ENDOSCOPY  06/2010    Social History   Socioeconomic History  . Marital status: Married    Spouse name: Not on file  . Number of children: Not on file  . Years of education: Not on file  . Highest education level: Not on file  Occupational History  . Not on file  Social Needs  . Financial resource strain: Patient refused  . Food insecurity:    Worry: Patient refused    Inability: Patient refused  . Transportation needs:    Medical: Patient refused    Non-medical: Patient refused  Tobacco Use  . Smoking status: Never Smoker  . Smokeless tobacco: Never Used  Substance and Sexual Activity  . Alcohol use: No    Alcohol/week: 0.0 standard drinks  . Drug use: No  . Sexual activity: Not on file  Lifestyle  . Physical activity:    Days per week: Patient refused    Minutes per session: Patient refused  . Stress: Patient refused  Relationships  . Social connections:    Talks on phone: Patient refused    Gets together: Patient refused    Attends religious service: Patient refused    Active member of club or organization: Patient refused    Attends meetings of clubs or organizations: Patient refused    Relationship status: Patient refused  .  Intimate partner violence:    Fear of current or ex partner: Patient refused    Emotionally abused: Patient refused    Physically abused: Patient refused    Forced sexual activity: Patient refused  Other Topics Concern  . Not on file  Social History Narrative   [name]. Has 1 child from 1st marriage and 2 step children from second.   Family History  Problem Relation Age of Onset  . Coronary artery disease Father       VITAL SIGNS BP 116/70   Pulse 80   Temp (!) 97.5 F (36.4 C)   Resp 17   Ht 5\' 10"  (1.778 m)   Wt 151 lb 6.4 oz (68.7 kg)   BMI 21.72 kg/m   Outpatient Encounter Medications as of 03/06/2019  Medication Sig  .  acetaminophen (TYLENOL) 325 MG tablet Take 2 tablets (650 mg total) by mouth every 6 (six) hours as needed for mild pain (or Fever >/= 101).  Marland Kitchen ALPRAZolam (XANAX) 0.25 MG tablet Take 1 tablet (0.25 mg total) by mouth 2 (two) times daily as needed for up to 13 days for anxiety.  Marland Kitchen atorvastatin (LIPITOR) 20 MG tablet Take 1 tablet (20 mg total) by mouth at bedtime.  Roseanne Kaufman Peru-Castor Oil (VENELEX) OINT Apply externally to sacrum and bilateral buttocks every shift and as needed  . cholecalciferol (VITAMIN D) 1000 UNITS tablet Take 1,000 Units by mouth daily.  . cyanocobalamin 500 MCG tablet Take 500 mcg by mouth daily.  . feeding supplement, ENSURE ENLIVE, (ENSURE ENLIVE) LIQD Take 237 mLs by mouth 2 (two) times daily between meals.  . furosemide (LASIX) 20 MG tablet Take 1 tablet (20 mg total) by mouth daily.  Marland Kitchen guaiFENesin (ROBITUSSIN) 100 MG/5ML SOLN Take 5 mLs by mouth 4 (four) times daily. Eery 4 hours during waking hours  . Multiple Vitamin (MULTIVITAMIN WITH MINERALS) TABS tablet Take 1 tablet by mouth daily.  . nitroGLYCERIN (NITROSTAT) 0.4 MG SL tablet Place 1 tablet (0.4 mg total) under the tongue every 5 (five) minutes as needed.  . NON FORMULARY Diet Type:  Mechanical soft  . Ostomy Supplies (SKIN PREP WIPES) MISC Apply to bilateral heels every shift for prevention  . OXYGEN Inhale 2 L/min into the lungs as needed. For SOB, dyspnea or sats < 90  . pantoprazole (PROTONIX) 40 MG tablet Take 1 tablet (40 mg total) by mouth daily before breakfast.  . Propylene Glycol (SYSTANE BALANCE OP) Place 2 drops into both eyes daily as needed (for dry eye relief).   . sertraline (ZOLOFT) 50 MG tablet Take 1 tablet (50 mg total) by mouth daily.  Marland Kitchen warfarin (COUMADIN) 3 MG tablet Take 3 mg by mouth every evening.    No facility-administered encounter medications on file as of 03/06/2019.      SIGNIFICANT DIAGNOSTIC EXAMS   PREVIOUS;   02-23-19: chest x-ray: Congestive heart failure superimposed  on interstitial fibrosis, and right greater than left pleural effusion. Surgical changes of median sternotomy and CABG  02-24-19: 2-d echo:   1. The left ventricle has normal systolic function, with an ejection fraction of 55-60%. There is anterosepatal hypokinesis. The cavity size was normal. There is mildly increased left ventricular wall thickness. Left ventricular diastolic Doppler parameters are indeterminate. There is right ventricular volume overload.  2. The right ventricle has normal systolic function. The cavity was normal. There is no increase in right ventricular wall thickness. Right ventricular systolic pressure is moderately elevated with an  estimated pressure of 52.4 mmHg.  3. Left atrial size was severely dilated.  4. Right atrial size was severely dilated.  5. The aortic valve is tricuspid. Mild calcification of the aortic valve. Aortic valve regurgitation is mild by color flow Doppler. Moderate aortic annular calcification noted.  6. The mitral valve is grossly normal. Mild thickening of the mitral valve leaflet. There is mild mitral annular calcification present.  7. The tricuspid valve is grossly normal. There is mild tricuspid regurgitation.  8. The aortic root is normal in size and structure.  9. The inferior vena cava was dilated in size with >50% respiratory variability.  TODAY:   03-05-19: chest x-ray: Improved aeration since 02/23/2019. Cardiomegaly with small pleural effusions or thickening, but decreased interstitial edema. There is underlying chronic interstitial lung disease.  LABS REVIEWED PREVIOUS:   04-12-18: chol 130; ldl 75 trig 121; hdl 31  02-23-19: wbc 6.9; hgb 10.8; hct 34.8; mcv 89.7; plt 254; glucose 126; bun 23; creat 1.20 ;k+ 5.0; na++ 137; ca 8.9; mag 1.7 BNP 225.0; INR 2.4 02-24-19: tsh 1.106 dig 0.7  02-25-19: wbc 7.7; hgb 10.5; hct 34.5; mcv 90.6 plt 282; glucose 111; bun 24; creat 1.31; k+ 4.2; na++ 138; ca 9.0;  02-26-19: INR 2.4 03-02-19: glucose 113;  bun 24; creat 1.15; k+ 4.6; na++ 131; ca 9.2 INR  2.4   NO NEW LABS.    Review of Systems  Constitutional: Positive for malaise/fatigue.       Poor appetite   Respiratory: Positive for cough and sputum production. Negative for shortness of breath.        Clear today   Cardiovascular: Negative for chest pain, palpitations and leg swelling.  Gastrointestinal: Negative for abdominal pain, constipation and heartburn.  Musculoskeletal: Negative for back pain, joint pain and myalgias.  Skin: Negative.   Neurological: Negative for dizziness.  Psychiatric/Behavioral: Positive for depression. The patient is not nervous/anxious.     Physical Exam Constitutional:      General: He is not in acute distress.    Appearance: He is well-developed. He is not diaphoretic.     Comments: thin  Eyes:     Comments: History of bilateral cataract removal and lens implant     Neck:     Musculoskeletal: Neck supple.     Thyroid: No thyromegaly.  Cardiovascular:     Rate and Rhythm: Normal rate. Rhythm irregular.     Pulses: Normal pulses.     Heart sounds: Normal heart sounds.     Comments: History of CABG  Pulmonary:     Effort: Pulmonary effort is normal. No respiratory distress.     Breath sounds: Normal breath sounds.     Comments: 02 dependent  Abdominal:     General: Bowel sounds are normal. There is no distension.     Palpations: Abdomen is soft.     Tenderness: There is no abdominal tenderness.     Comments: History of esophageal dilatation     Musculoskeletal: Normal range of motion.     Right lower leg: No edema.     Left lower leg: No edema.     Comments: Slight kyphosis  History of lumbar surgery     Lymphadenopathy:     Cervical: No cervical adenopathy.  Skin:    General: Skin is warm and dry.  Neurological:     Mental Status: He is alert and oriented to person, place, and time.  Psychiatric:        Mood and Affect:  Mood normal.      ASSESSMENT/ PLAN:  TODAY:   1.  GERD without esophagitis: is stable will continue protonix 40 mg daily  2. Mixed hyperlipidemia: is stable LDL 75; will continue lipitor 20 mg daily   3. Permanent atrial fibrillation: heart rate stable is on long term coumadin therapy.    PREVIOUS  4. Acute diastolic CHF (congestive heart failure) EF 55-60% (02-24-19): will continue lasix 20 mg daily   5. Coronary artery disease involving coronary artery bypass graft of native heart with angina pectoris: is stable is on long term coumadin therapy and has prn ntg  6. Interstitial pulmonary fibrosis (on chest x-ray 02-23-19) is without change: has chronic cough with sputum will continue robitussin 100 mg every 4 hours while awake has 02 prn.   7. Chronic recurrent major depressive disorder: is without change: will continue zoloft 50 mg daily and xanax 0.25 mg twice daily   8. UTI: is stable will continue amoxicillin 500 mg every 8 hours and will monitor  10. Chronic anemia: hgb 10.5 is stable will monitor  11. Dysphagia: is without signs of aspiration present; will not make changes will monitor his status.    MD is aware of resident's narcotic use and is in agreement with current plan of care. We will attempt to wean resident as apropriate   Ok Edwards NP [facility]  Contact [phone] Monday through Friday 8am- 5pm  After hours call [phone]

## 2019-03-08 ENCOUNTER — Encounter (HOSPITAL_COMMUNITY)
Admission: RE | Admit: 2019-03-08 | Discharge: 2019-03-08 | Disposition: A | Discharge status: Home / Self Care | Payer: Medicare Other | Source: Skilled Nursing Facility | Attending: *Deleted | Admitting: *Deleted

## 2019-03-08 LAB — C DIFFICILE QUICK SCREEN W PCR REFLEX
C Diff antigen: NEGATIVE
C Diff interpretation: NOT DETECTED
C Diff toxin: NEGATIVE

## 2019-03-09 ENCOUNTER — Encounter: Payer: Self-pay | Admitting: Internal Medicine

## 2019-03-09 ENCOUNTER — Encounter (HOSPITAL_COMMUNITY)
Admission: RE | Admit: 2019-03-09 | Discharge: 2019-03-09 | Disposition: A | Discharge status: Home / Self Care | Payer: Medicare Other | Source: Skilled Nursing Facility | Attending: *Deleted | Admitting: *Deleted

## 2019-03-09 ENCOUNTER — Non-Acute Institutional Stay (SKILLED_NURSING_FACILITY): Payer: Medicare Other | Admitting: Internal Medicine

## 2019-03-09 DIAGNOSIS — K219 Gastro-esophageal reflux disease without esophagitis: Secondary | ICD-10-CM

## 2019-03-09 DIAGNOSIS — I4821 Permanent atrial fibrillation: Secondary | ICD-10-CM

## 2019-03-09 DIAGNOSIS — I5031 Acute diastolic (congestive) heart failure: Secondary | ICD-10-CM

## 2019-03-09 DIAGNOSIS — J8410 Pulmonary fibrosis, unspecified: Secondary | ICD-10-CM

## 2019-03-09 LAB — COMPREHENSIVE METABOLIC PANEL
ALT: 34 U/L (ref 0–44)
AST: 31 U/L (ref 15–41)
Albumin: 3.4 g/dL — ABNORMAL LOW (ref 3.5–5.0)
Alkaline Phosphatase: 120 U/L (ref 38–126)
Anion gap: 13 (ref 5–15)
BUN: 41 mg/dL — ABNORMAL HIGH (ref 8–23)
CO2: 24 mmol/L (ref 22–32)
Calcium: 9.4 mg/dL (ref 8.9–10.3)
Chloride: 93 mmol/L — ABNORMAL LOW (ref 98–111)
Creatinine, Ser: 0.94 mg/dL (ref 0.61–1.24)
GFR calc Af Amer: >60 mL/min (ref >60)
GFR calc non Af Amer: >60 mL/min (ref >60)
Glucose, Bld: 130 mg/dL — ABNORMAL HIGH (ref 70–99)
Potassium: 4.8 mmol/L (ref 3.5–5.1)
Sodium: 130 mmol/L — ABNORMAL LOW (ref 135–145)
Total Bilirubin: 0.7 mg/dL (ref 0.3–1.2)
Total Protein: 7.1 g/dL (ref 6.5–8.1)

## 2019-03-09 LAB — CBC WITH DIFFERENTIAL/PLATELET
Abs Immature Granulocytes: 0.04 10*3/uL (ref 0.00–0.07)
Basophils Absolute: 0.1 10*3/uL (ref 0.0–0.1)
Basophils Relative: 1 %
Eosinophils Absolute: 0.4 10*3/uL (ref 0.0–0.5)
Eosinophils Relative: 3 %
HCT: 38.6 % — ABNORMAL LOW (ref 39.0–52.0)
Hemoglobin: 12.2 g/dL — ABNORMAL LOW (ref 13.0–17.0)
Immature Granulocytes: 0 %
Lymphocytes Relative: 22 %
Lymphs Abs: 2.4 10*3/uL (ref 0.7–4.0)
MCH: 27.4 pg (ref 26.0–34.0)
MCHC: 31.6 g/dL (ref 30.0–36.0)
MCV: 86.7 fL (ref 80.0–100.0)
Monocytes Absolute: 1.1 10*3/uL — ABNORMAL HIGH (ref 0.1–1.0)
Monocytes Relative: 10 %
Neutro Abs: 6.9 10*3/uL (ref 1.7–7.7)
Neutrophils Relative %: 64 %
Platelets: 274 10*3/uL (ref 150–400)
RBC: 4.45 MIL/uL (ref 4.22–5.81)
RDW: 16.1 % — ABNORMAL HIGH (ref 11.5–15.5)
WBC: 10.8 10*3/uL — ABNORMAL HIGH (ref 4.0–10.5)
nRBC: 0 % (ref 0.0–0.2)

## 2019-03-09 LAB — PROTIME-INR
INR: 2.1 — ABNORMAL HIGH (ref 0.8–1.2)
Prothrombin Time: 22.9 seconds — ABNORMAL HIGH (ref 11.4–15.2)

## 2019-03-09 NOTE — Progress Notes (Signed)
Location:  [place] Room Number: [place] of Service:  SNF [id number]) Provider:  Veleta Miners, MD  Sinda Du, MD  Patient Care Team: Sinda Du, MD as PCP - General (Pulmonary Disease) Satira Sark, MD as PCP - Cardiology (Cardiology)  Extended Emergency Contact Information Primary Emergency Contact: Docia [place] Phone: [phone] Mobile Phone: [phone] Relation: Daughter Secondary Emergency Contact: Collins [place] Mobile Phone: [phone] Relation: Son  Code Status:  Full Code Goals of care: Advanced Directive information Advanced Directives 03/09/2019  Does Patient Have a Medical Advance Directive? No  Would patient like information on creating a medical advance directive? No - Patient declined     Chief Complaint  Patient presents with  . Acute Visit    Difficulty swallowing, depression, loss of appetite    HPI:  Pt is a 85 y.o. male seen today for an acute visit for Continue Depression and Poor Appetite  Patient has a history of CAD status post CABG , hyperlipidemia , chronic atrial fibrillation on Coumadin, H/o Esophageal Dysphagia S/P barium in 2016 showed Severe Esophageal Dysmotility Patient was admitted after his family was unable to take care of him at home and brought him to hospital. In ED he also complained of edema in his LE and had CHF on his x-ray.  He was admitted with acute CHF. His EF is 55 to 60% with biatrial dilatation. He was seen by cardiology they discontinued his Digoxin and diuresed him with Lasix  Since he has been in facility he has not made much progress.  He continues to be weak.   Therapy is not able to do much with him.  Still cannot stand without Mod assist and needs help with transfers He refuses to eat sometimes.He says he has no appetite Though his nausea is better.  And his diarrhea has resolved.  Denies any abdominal pain He cough.  Denies any shortness of  breath. He continues  to be very Depressed  Past Medical History:  Diagnosis Date  . Atrial fibrillation ([place])   . Cancer ([place])   . Coronary atherosclerosis of native coronary artery    Prior stenting of LAD and diagonal at [facility] with subsequent SVG to diagonal, LVEF 55-60%  . Dysphagia   . GERD   . Hiccups   . History of prostate cancer   . Mixed hyperlipidemia   . Weight loss    Past Surgical History:  Procedure Laterality Date  . Bilateral inguinal hernia repair    . bulging disc    . COLONOSCOPY  06/2010  . CORONARY ARTERY BYPASS GRAFT  1996    Emergent SVG to diagonal at [facility]  . ESOPHAGEAL DILATION N/A 06/10/2015   Procedure: ESOPHAGEAL DILATION;  Surgeon: Rogene Houston, MD;  Location: [facility];  Service: Endoscopy;  Laterality: N/A;  . ESOPHAGOGASTRODUODENOSCOPY     w/esophageal dilation   . ESOPHAGOGASTRODUODENOSCOPY N/A 06/10/2015   Procedure: ESOPHAGOGASTRODUODENOSCOPY (EGD);  Surgeon: Rogene Houston, MD;  Location: [facility];  Service: Endoscopy;  Laterality: N/A;  245  . IR KYPHO LUMBAR INC FX REDUCE BONE BX UNI/BIL CANNULATION INC/IMAGING  03/07/2017  . IR RADIOLOGIST EVAL & MGMT  02/28/2017  . UPPER GASTROINTESTINAL ENDOSCOPY  06/2010    Allergies  Allergen Reactions  . Sulfamethoxazole     Other reaction(s): Red Man Syndrome (ALLERGY)  . Sulfonamide Derivatives     Patient says his wife says he's allergic to sulfa.  Outpatient Encounter Medications as of 03/09/2019  Medication Sig  . acetaminophen (TYLENOL) 325 MG tablet Take 2 tablets (650 mg total) by mouth every 6 (six) hours as needed for mild pain (or Fever >/= 101).  Marland Kitchen ALPRAZolam (XANAX) 0.25 MG tablet Take 1 tablet (0.25 mg total) by mouth 2 (two) times daily as needed for up to 13 days for anxiety.  Marland Kitchen atorvastatin (LIPITOR) 20 MG tablet Take 1 tablet (20 mg total) by mouth at bedtime.  Roseanne Kaufman Peru-Castor Oil (VENELEX) OINT Apply externally to sacrum and bilateral buttocks every shift and as  needed  . cholecalciferol (VITAMIN D) 1000 UNITS tablet Take 1,000 Units by mouth daily.  . cyanocobalamin 500 MCG tablet Take 500 mcg by mouth daily.  . feeding supplement, ENSURE ENLIVE, (ENSURE ENLIVE) LIQD Take 237 mLs by mouth 2 (two) times daily between meals.  . furosemide (LASIX) 20 MG tablet Take 1 tablet (20 mg total) by mouth daily.  Marland Kitchen guaiFENesin (ROBITUSSIN) 100 MG/5ML SOLN Take 5 mLs by mouth 4 (four) times daily. Eery 4 hours during waking hours  . Multiple Vitamin (MULTIVITAMIN WITH MINERALS) TABS tablet Take 1 tablet by mouth daily.  . nitroGLYCERIN (NITROSTAT) 0.4 MG SL tablet Place 1 tablet (0.4 mg total) under the tongue every 5 (five) minutes as needed.  . NON FORMULARY Diet Type:  Mechanical soft  . Ostomy Supplies (SKIN PREP WIPES) MISC Apply to bilateral heels every shift for prevention  . OXYGEN Inhale 2 L/min into the lungs as needed. For SOB, dyspnea or sats < 90  . pantoprazole (PROTONIX) 40 MG tablet Take 1 tablet (40 mg total) by mouth daily before breakfast.  . Propylene Glycol (SYSTANE BALANCE OP) Place 2 drops into both eyes daily as needed (for dry eye relief).   . sertraline (ZOLOFT) 50 MG tablet Take 1 tablet (50 mg total) by mouth daily.  Marland Kitchen warfarin (COUMADIN) 3 MG tablet Take 3 mg by mouth every evening.    No facility-administered encounter medications on file as of 03/09/2019.     Review of Systems  Constitutional: Positive for activity change, appetite change and unexpected weight change.  HENT: Negative.   Respiratory: Positive for cough.   Cardiovascular: Negative.   Gastrointestinal: Negative.   Genitourinary: Negative.   Musculoskeletal: Positive for back pain.  Skin: Negative.   Neurological: Positive for weakness.  Psychiatric/Behavioral: Positive for confusion and dysphoric mood.    Immunization History  Administered Date(s) Administered  . Influenza,inj,Quad PF,6+ Mos 08/25/2014   Pertinent  Health Maintenance Due  Topic Date Due   . PNA vac Low Risk Adult (1 of 2 - PCV13) 03/30/2019 (Originally 10/05/1995)  . INFLUENZA VACCINE  05/30/2019   No flowsheet data found. Functional Status Survey:    Vitals:   03/09/19 1053  BP: 138/79  Pulse: 81  Resp: 20  Temp: (!) 97.5 F (36.4 C)  Weight: 146 lb 4.8 oz (66.4 kg)  Height: 5\' 10"  (1.778 m)   Body mass index is 20.99 kg/m. Physical Exam Vitals signs reviewed.  Constitutional:      Appearance: Normal appearance.  HENT:     Head: Normocephalic.     Nose: Nose normal.     Mouth/Throat:     Mouth: Mucous membranes are dry.  Eyes:     Pupils: Pupils are equal, round, and reactive to light.  Neck:     Musculoskeletal: Neck supple.  Cardiovascular:     Rate and Rhythm: Normal rate and regular rhythm.  Pulses: Normal pulses.     Heart sounds: Normal heart sounds.  Pulmonary:     Effort: Pulmonary effort is normal.     Breath sounds: Normal breath sounds.  Abdominal:     General: Abdomen is flat. Bowel sounds are normal.     Palpations: Abdomen is soft.  Musculoskeletal:        General: No swelling.  Skin:    General: Skin is warm and dry.  Neurological:     General: No focal deficit present.     Mental Status: He is alert.  Psychiatric:        Mood and Affect: Mood is anxious and depressed.        Speech: Speech is delayed.        Behavior: Behavior is slowed.     Labs reviewed: Recent Labs    02/23/19 1739 02/24/19 0510 02/25/19 0424 03/02/19 0700 03/09/19 0731  NA 137 138 138 131* 130*  K 5.0 4.7 4.2 4.6 4.8  CL 104 104 101 94* 93*  CO2 24 25 26 25 24   GLUCOSE 126* 107* 111* 113* 130*  BUN 23 20 24* 24* 41*  CREATININE 1.20 1.29* 1.31* 1.15 0.94  CALCIUM 8.9 9.0 9.0 9.2 9.4  MG 1.7 1.7  --   --   --    Recent Labs    04/02/18 1031 03/09/19 0731  AST 14 31  ALT 12 34  ALKPHOS 107 120  BILITOT 0.5 0.7  PROT 6.8 7.1  ALBUMIN 4.0 3.4*   Recent Labs    02/23/19 1739 02/24/19 0510 02/25/19 0424 03/09/19 0731  WBC 6.9  7.5 7.7 10.8*  NEUTROABS 4.5  --   --  6.9  HGB 10.8* 10.8* 10.5* 12.2*  HCT 34.8* 36.4* 34.5* 38.6*  MCV 89.7 91.0 90.6 86.7  PLT 254 267 282 274   Lab Results  Component Value Date   TSH 1.106 02/24/2019   No results found for: HGBA1C Lab Results  Component Value Date   CHOL 130 04/02/2018   HDL 31 (L) 04/02/2018   LDLCALC 75 04/02/2018   TRIG 121 04/02/2018   CHOLHDL 4.2 04/02/2018    Significant Diagnostic Results in last 30 days:  Dg Chest 2 View  Result Date: 03/05/2019 CLINICAL DATA:  Cough congestion EXAM: CHEST - 2 VIEW COMPARISON:  02/23/2019, 08/25/2015, CT 08/13/2018 FINDINGS: Post sternotomy changes. Small bilateral pleural effusions or pleural scarring. Interstitial fibrosis. Overall findings are improved as compared with 02/23/2019. Cardiomegaly with aortic atherosclerosis. No pneumothorax. IMPRESSION: Improved aeration since 02/23/2019. Cardiomegaly with small pleural effusions or thickening, but decreased interstitial edema. There is underlying chronic interstitial lung disease. Electronically Signed   By: Donavan Foil M.D.   On: 03/05/2019 19:35   Dg Chest 2 View  Result Date: 02/23/2019 CLINICAL DATA:  85 year old male with weakness and inability to walk EXAM: CHEST - 2 VIEW COMPARISON:  08/24/2017, CT 08/13/2018 FINDINGS: Cardiomediastinal silhouette unchanged in size and contour. Surgical changes of median sternotomy and CABG. Reticular pattern of opacity of the bilateral lungs, worsening since the prior. Interlobular septal thickening. Opacities at the lung bases with blunting of the bilateral costophrenic angles, worse on the right. Meniscus on the lateral view. IMPRESSION: Congestive heart failure superimposed on interstitial fibrosis, and right greater than left pleural effusion. Surgical changes of median sternotomy and CABG Electronically Signed   By: Corrie Mckusick D.O.   On: 02/23/2019 18:33    Assessment/Plan   Acute diastolic CHF (congestive heart  failure)  He has lost 4 lbs since he has been here His Bun is elevated today Will decrase his Lasix to 3/week Repeat Bmp in 1 week Repeat Chest Xray showed almost resolve Pleural effusion But he has ILD Permanent atrial fibrillation Rate Control  Digoxin was stopped On Comadin Continue same doe of Coumdin INR was therapeutic today Repeat INR in 2 weeks  H/O Interstitial pulmonary fibrosis  Continue on Oxygen GERD Continue on Protonix Chronic anemia Hgb Stable Mild Leucocytosis Chest Xray was negative recenlty and he was treated for UTI with Ampicillin . Continue to monitor Vitals Repeat CBC Dysphagia His Appetite continues to be  very poor On D3 diet  D/W dietician Depression Will start on Remeron 7.5 mg and slowly increase it Psych will chiim tomorrow Hyponatremia with Sodium of 130 will taper Zoloft to 25 mg. Chronic back Pain Continue on tylenol PRn Failure to thrive Patient seems to be continuing doing poor Overall prognosis is guarded  Family/ staff Communication:   Labs/tests ordered:  BMP and CBC in 1 week PT INR in 2 weeks

## 2019-03-11 ENCOUNTER — Non-Acute Institutional Stay (SKILLED_NURSING_FACILITY): Payer: Medicare Other | Admitting: Adult Health

## 2019-03-11 ENCOUNTER — Encounter: Payer: Self-pay | Admitting: Adult Health

## 2019-03-11 DIAGNOSIS — R627 Adult failure to thrive: Secondary | ICD-10-CM

## 2019-03-11 DIAGNOSIS — I5032 Chronic diastolic (congestive) heart failure: Secondary | ICD-10-CM

## 2019-03-11 DIAGNOSIS — J8410 Pulmonary fibrosis, unspecified: Secondary | ICD-10-CM

## 2019-03-11 NOTE — Progress Notes (Signed)
Location:   [place] Room Number: 128 P Place of Service:  SNF (31)   CODE STATUS: Full Code  Allergies  Allergen Reactions  . Sulfamethoxazole     Other reaction(s): Red Man Syndrome (ALLERGY)  . Sulfonamide Derivatives     Patient says his wife says he's allergic to sulfa.    Chief Complaint  Patient presents with  . Acute Visit    Care Plan Meeting    HPI:  We have come together for his routine care plan meeting. His family is present via phone. His goal continues to return back home. He will need to be able to stand and transfer himself in order to return back home. He continues to require extensive assist with adls. He is not participating well with therapy. He needs assistance with feeding. His appetite is nil. There are no reports of fevers present. I have discussed his overall status with his family. He has a poor prognosis; they are aware and do understand that his outcome may be very poor.    Past Medical History:  Diagnosis Date  . Atrial fibrillation ([place])   . Cancer ([place])   . Coronary atherosclerosis of native coronary artery    Prior stenting of LAD and diagonal at [facility] with subsequent SVG to diagonal, LVEF 55-60%  . Dysphagia   . GERD   . Hiccups   . History of prostate cancer   . Mixed hyperlipidemia   . Weight loss     Past Surgical History:  Procedure Laterality Date  . Bilateral inguinal hernia repair    . bulging disc    . COLONOSCOPY  06/2010  . CORONARY ARTERY BYPASS GRAFT  1996    Emergent SVG to diagonal at [facility]  . ESOPHAGEAL DILATION N/A 06/10/2015   Procedure: ESOPHAGEAL DILATION;  Surgeon: Rogene Houston, MD;  Location: [facility];  Service: Endoscopy;  Laterality: N/A;  . ESOPHAGOGASTRODUODENOSCOPY     w/esophageal dilation   . ESOPHAGOGASTRODUODENOSCOPY N/A 06/10/2015   Procedure: ESOPHAGOGASTRODUODENOSCOPY (EGD);  Surgeon: Rogene Houston, MD;  Location: [facility] SU[facility];  Service: Endoscopy;  Laterality:  N/A;  245  . IR KYPHO LUMBAR INC FX REDUCE BONE BX UNI/BIL CANNULATION INC/IMAGING  03/07/2017  . IR RADIOLOGIST EVAL & MGMT  02/28/2017  . UPPER GASTROINTESTINAL ENDOSCOPY  06/2010    Social History   Socioeconomic History  . Marital status: Married    Spouse name: Not on file  . Number of children: Not on file  . Years of education: Not on file  . Highest education level: Not on file  Occupational History  . Not on file  Social Needs  . Financial resource strain: Patient refused  . Food insecurity:    Worry: Patient refused    Inability: Patient refused  . Transportation needs:    Medical: Patient refused    Non-medical: Patient refused  Tobacco Use  . Smoking status: Never Smoker  . Smokeless tobacco: Never Used  Substance and Sexual Activity  . Alcohol use: No    Alcohol/week: 0.0 standard drinks  . Drug use: No  . Sexual activity: Not on file  Lifestyle  . Physical activity:    Days per week: Patient refused    Minutes per session: Patient refused  . Stress: Patient refused  Relationships  . Social connections:    Talks on phone: Patient refused    Gets together: Patient refused    Attends religious service: Patient refused  Active member of club or organization: Patient refused    Attends meetings of clubs or organizations: Patient refused    Relationship status: Patient refused  . Intimate partner violence:    Fear of current or ex partner: Patient refused    Emotionally abused: Patient refused    Physically abused: Patient refused    Forced sexual activity: Patient refused  Other Topics Concern  . Not on file  Social History Narrative   [name]. Has 1 child from 1st marriage and 2 step children from second.   Family History  Problem Relation Age of Onset  . Coronary artery disease Father       VITAL SIGNS BP 113/70   Pulse 80   Temp 97.6 F (36.4 C)   Resp 20   Ht 5\' 10"  (1.778 m)   Wt 147 lb (66.7 kg)   BMI 21.09 kg/m    Outpatient Encounter Medications as of 03/11/2019  Medication Sig  . acetaminophen (TYLENOL) 325 MG tablet Take 2 tablets (650 mg total) by mouth every 6 (six) hours as needed for mild pain (or Fever >/= 101).  Marland Kitchen ALPRAZolam (XANAX) 0.25 MG tablet Take 1 tablet (0.25 mg total) by mouth 2 (two) times daily as needed for up to 13 days for anxiety.  Marland Kitchen atorvastatin (LIPITOR) 20 MG tablet Take 1 tablet (20 mg total) by mouth at bedtime.  Roseanne Kaufman Peru-Castor Oil (VENELEX) OINT Apply externally to sacrum and bilateral buttocks every shift and as needed  . cholecalciferol (VITAMIN D) 1000 UNITS tablet Take 1,000 Units by mouth daily.  . cyanocobalamin 500 MCG tablet Take 500 mcg by mouth daily.  . feeding supplement, ENSURE ENLIVE, (ENSURE ENLIVE) LIQD Take 237 mLs by mouth 2 (two) times daily between meals.  . furosemide (LASIX) 20 MG tablet Take 1 tablet (20 mg total) by mouth daily.  Marland Kitchen guaiFENesin (ROBITUSSIN) 100 MG/5ML SOLN Take 5 mLs by mouth 4 (four) times daily. Eery 4 hours during waking hours  . mirtazapine (REMERON) 7.5 MG tablet Take 7.5 mg by mouth at bedtime.  . Multiple Vitamin (MULTIVITAMIN WITH MINERALS) TABS tablet Take 1 tablet by mouth daily.  . nitroGLYCERIN (NITROSTAT) 0.4 MG SL tablet Place 1 tablet (0.4 mg total) under the tongue every 5 (five) minutes as needed.  . NON FORMULARY Diet Type:  Mechanical soft  . Ostomy Supplies (SKIN PREP WIPES) MISC Apply to bilateral heels every shift for prevention  . OXYGEN Inhale 2 L/min into the lungs as needed. For SOB, dyspnea or sats < 90  . pantoprazole (PROTONIX) 40 MG tablet Take 1 tablet (40 mg total) by mouth daily before breakfast.  . Propylene Glycol (SYSTANE BALANCE OP) Place 2 drops into both eyes daily as needed (for dry eye relief).   . sertraline (ZOLOFT) 50 MG tablet Take 1 tablet (50 mg total) by mouth daily.  Marland Kitchen warfarin (COUMADIN) 3 MG tablet Take 3 mg by mouth every evening.    No facility-administered encounter  medications on file as of 03/11/2019.      SIGNIFICANT DIAGNOSTIC EXAMS  PREVIOUS;   02-23-19: chest x-ray: Congestive heart failure superimposed on interstitial fibrosis, and right greater than left pleural effusion. Surgical changes of median sternotomy and CABG  02-24-19: 2-d echo:   1. The left ventricle has normal systolic function, with an ejection fraction of 55-60%. There is anterosepatal hypokinesis. The cavity size was normal. There is mildly increased left ventricular wall thickness. Left ventricular diastolic Doppler parameters are  indeterminate. There is right ventricular volume overload.  2. The right ventricle has normal systolic function. The cavity was normal. There is no increase in right ventricular wall thickness. Right ventricular systolic pressure is moderately elevated with an estimated pressure of 52.4 mmHg.  3. Left atrial size was severely dilated.  4. Right atrial size was severely dilated.  5. The aortic valve is tricuspid. Mild calcification of the aortic valve. Aortic valve regurgitation is mild by color flow Doppler. Moderate aortic annular calcification noted.  6. The mitral valve is grossly normal. Mild thickening of the mitral valve leaflet. There is mild mitral annular calcification present.  7. The tricuspid valve is grossly normal. There is mild tricuspid regurgitation.  8. The aortic root is normal in size and structure.  9. The inferior vena cava was dilated in size with >50% respiratory variability.  03-05-19: chest x-ray: Improved aeration since 02/23/2019. Cardiomegaly with small pleural effusions or thickening, but decreased interstitial edema. There is underlying chronic interstitial lung disease.  NO NEW EXAMS.   LABS REVIEWED PREVIOUS:   04-12-18: chol 130; ldl 75 trig 121; hdl 31  02-23-19: wbc 6.9; hgb 10.8; hct 34.8; mcv 89.7; plt 254; glucose 126; bun 23; creat 1.20 ;k+ 5.0; na++ 137; ca 8.9; mag 1.7 BNP 225.0; INR 2.4 02-24-19: tsh 1.106 dig  0.7  02-25-19: wbc 7.7; hgb 10.5; hct 34.5; mcv 90.6 plt 282; glucose 111; bun 24; creat 1.31; k+ 4.2; na++ 138; ca 9.0;  02-26-19: INR 2.4 03-02-19: glucose 113; bun 24; creat 1.15; k+ 4.6; na++ 131; ca 9.2 INR  2.4   TODAY:   03-09-19: wbc 10.8; hgb 12.2; hct 38.6; mcv 86.7 plt 274 glucose 130; bun 41; creat 0.94; k+ 4.8; na++ 130; ca 9.4; liver normal albumin 3.4 INR 2.1    Review of Systems  Constitutional: Positive for malaise/fatigue.  Respiratory: Positive for cough and shortness of breath.   Cardiovascular: Negative for chest pain, palpitations and leg swelling.  Gastrointestinal: Negative for abdominal pain, constipation and heartburn.  Musculoskeletal: Negative for back pain, joint pain and myalgias.  Skin: Negative.   Neurological: Negative for dizziness.  Psychiatric/Behavioral: The patient is not nervous/anxious.    Physical Exam Constitutional:      General: He is not in acute distress.    Appearance: He is underweight. He is not diaphoretic.  Eyes:     Comments:  History of bilateral cataract removal and lens implant  Neck:     Musculoskeletal: Neck supple.     Thyroid: No thyromegaly.  Cardiovascular:     Rate and Rhythm: Normal rate. Rhythm irregular.     Pulses: Normal pulses.     Heart sounds: Normal heart sounds.     Comments: History of CABG  Pulmonary:     Effort: Pulmonary effort is normal. No respiratory distress.     Breath sounds: Normal breath sounds.     Comments: 02 dependent  Abdominal:     General: Bowel sounds are normal. There is no distension.     Palpations: Abdomen is soft.     Tenderness: There is no abdominal tenderness.     Comments:  History of esophageal dilatation      Musculoskeletal:     Right lower leg: No edema.     Left lower leg: No edema.     Comments: Slight kyphosis  History of lumbar surgery      Lymphadenopathy:     Cervical: No cervical adenopathy.  Skin:    General: Skin  is warm and dry.  Neurological:     Mental  Status: He is alert and oriented to person, place, and time.  Psychiatric:        Mood and Affect: Mood normal.      ASSESSMENT/ PLAN:  TODAY:   1. Chronic diastolic heart failure 2. Interstitial pulmonary fibrosis 3. Adult failure to thrive  Will continue his current plan of care Will continue therapy as directed Will continue to monitor his status.    MD is aware of resident's narcotic use and is in agreement with current plan of care. We will attempt to wean resident as apropriate   Ok Edwards NP [facility]  Contact [phone] Monday through Friday 8am- 5pm  After hours call [phone]

## 2019-03-13 ENCOUNTER — Non-Acute Institutional Stay (SKILLED_NURSING_FACILITY): Payer: Medicare Other | Admitting: Adult Health

## 2019-03-13 ENCOUNTER — Encounter: Payer: Self-pay | Admitting: Adult Health

## 2019-03-13 DIAGNOSIS — I5032 Chronic diastolic (congestive) heart failure: Secondary | ICD-10-CM

## 2019-03-13 DIAGNOSIS — R627 Adult failure to thrive: Secondary | ICD-10-CM | POA: Insufficient documentation

## 2019-03-13 DIAGNOSIS — J8410 Pulmonary fibrosis, unspecified: Secondary | ICD-10-CM

## 2019-03-13 NOTE — Progress Notes (Signed)
Location:    [place] Room Number: 128/P Place of Service:  SNF (31)   CODE STATUS: Full Code  Allergies  Allergen Reactions  . Sulfamethoxazole     Other reaction(s): Red Man Syndrome (ALLERGY)  . Sulfonamide Derivatives     Patient says his wife says he's allergic to sulfa.    Chief Complaint  Patient presents with  . Medical Management of Chronic Issues    Chronic diastolic heart failure; interstitial pulmonary fibrosis; adult failure to thrive.   Weekly follow up for the first 30 days post hospitalization.     HPI:  He is a 85 year old short term rehab patient being seen for the management of his chronic illnesses: heart failure; pulmonary fibrosis failure to thrive. He is not eating or drinking well. He is more lethargic and weak; is not tolerating remeron well. He has not used his xanax; will need to lower his zoloft. Will need to stop lipitor due to his poor po intake. He was able to get out of bed today for 15 minutes and did do exercises. He denies any uncontrolled pain. No fevers present.   Past Medical History:  Diagnosis Date  . Atrial fibrillation ([place])   . Cancer ([place])   . Coronary atherosclerosis of native coronary artery    Prior stenting of LAD and diagonal at [facility] with subsequent SVG to diagonal, LVEF 55-60%  . Dysphagia   . GERD   . Hiccups   . History of prostate cancer   . Mixed hyperlipidemia   . Weight loss     Past Surgical History:  Procedure Laterality Date  . Bilateral inguinal hernia repair    . bulging disc    . COLONOSCOPY  06/2010  . CORONARY ARTERY BYPASS GRAFT  1996    Emergent SVG to diagonal at [facility]  . ESOPHAGEAL DILATION N/A 06/10/2015   Procedure: ESOPHAGEAL DILATION;  Surgeon: Rogene Houston, MD;  Location[facility];  Service: Endoscopy;  Laterality: N/A;  . ESOPHAGOGASTRODUODENOSCOPY     w/esophageal dilation   . ESOPHAGOGASTRODUODENOSCOPY N/A 06/10/2015   Procedure: ESOPHAGOGASTRODUODENOSCOPY  (EGD);  Surgeon: Rogene Houston, MD;  Location: [facility];  Service: Endoscopy;  Laterality: N/A;  245  . IR KYPHO LUMBAR INC FX REDUCE BONE BX UNI/BIL CANNULATION INC/IMAGING  03/07/2017  . IR RADIOLOGIST EVAL & MGMT  02/28/2017  . UPPER GASTROINTESTINAL ENDOSCOPY  06/2010    Social History   Socioeconomic History  . Marital status: Married    Spouse name: Not on file  . Number of children: Not on file  . Years of education: Not on file  . Highest education level: Not on file  Occupational History  . Not on file  Social Needs  . Financial resource strain: Patient refused  . Food insecurity:    Worry: Patient refused    Inability: Patient refused  . Transportation needs:    Medical: Patient refused    Non-medical: Patient refused  Tobacco Use  . Smoking status: Never Smoker  . Smokeless tobacco: Never Used  Substance and Sexual Activity  . Alcohol use: No    Alcohol/week: 0.0 standard drinks  . Drug use: No  . Sexual activity: Not on file  Lifestyle  . Physical activity:    Days per week: Patient refused    Minutes per session: Patient refused  . Stress: Patient refused  Relationships  . Social connections:    Talks on phone: Patient refused  Gets together: Patient refused    Attends religious service: Patient refused    Active member of club or organization: Patient refused    Attends meetings of clubs or organizations: Patient refused    Relationship status: Patient refused  . Intimate partner violence:    Fear of current or ex partner: Patient refused    Emotionally abused: Patient refused    Physically abused: Patient refused    Forced sexual activity: Patient refused  Other Topics Concern  . Not on file  Social History Narrative   [name]. Has 1 child from 1st marriage and 2 step children from second.   Family History  Problem Relation Age of Onset  . Coronary artery disease Father       VITAL SIGNS BP 109/73   Pulse 78   Temp  97.6 F (36.4 C) (Oral)   Resp 20   Ht 5\' 10"  (1.778 m)   Wt 144 lb 12.8 oz (65.7 kg)   BMI 20.78 kg/m   Outpatient Encounter Medications as of 03/13/2019  Medication Sig  . acetaminophen (TYLENOL) 325 MG tablet Take 2 tablets (650 mg total) by mouth every 6 (six) hours as needed for mild pain (or Fever >/= 101).  Marland Kitchen ALPRAZolam (XANAX) 0.25 MG tablet Take 1 tablet (0.25 mg total) by mouth 2 (two) times daily as needed for up to 13 days for anxiety.  Marland Kitchen atorvastatin (LIPITOR) 20 MG tablet Take 1 tablet (20 mg total) by mouth at bedtime.  Roseanne Kaufman Peru-Castor Oil (VENELEX) OINT Apply externally to sacrum and bilateral buttocks every shift and as needed  . cholecalciferol (VITAMIN D) 1000 UNITS tablet Take 1,000 Units by mouth daily.  . cyanocobalamin 500 MCG tablet Take 500 mcg by mouth daily.  . feeding supplement, ENSURE ENLIVE, (ENSURE ENLIVE) LIQD Take 237 mLs by mouth 2 (two) times daily between meals.  . furosemide (LASIX) 20 MG tablet Take 20 mg by mouth daily. M- W- F  . guaiFENesin (ROBITUSSIN) 100 MG/5ML SOLN Take 5 mLs by mouth 4 (four) times daily. Eery 4 hours during waking hours  . mirtazapine (REMERON) 7.5 MG tablet Take 7.5 mg by mouth at bedtime.  . Multiple Vitamin (MULTIVITAMIN WITH MINERALS) TABS tablet Take 1 tablet by mouth daily.  . nitroGLYCERIN (NITROSTAT) 0.4 MG SL tablet Place 1 tablet (0.4 mg total) under the tongue every 5 (five) minutes as needed.  . NON FORMULARY Diet Type:  Mechanical soft  . Ostomy Supplies (SKIN PREP WIPES) MISC Apply to bilateral heels every shift for prevention  . OXYGEN Inhale 2 L/min into the lungs as needed. For SOB, dyspnea or sats < 90  . pantoprazole (PROTONIX) 40 MG tablet Take 1 tablet (40 mg total) by mouth daily before breakfast.  . Propylene Glycol (SYSTANE BALANCE OP) Place 2 drops into both eyes daily as needed (for dry eye relief).   . sertraline (ZOLOFT) 50 MG tablet Take 1 tablet (50 mg total) by mouth daily.  Marland Kitchen warfarin  (COUMADIN) 3 MG tablet Take 3 mg by mouth every evening.   . [DISCONTINUED] furosemide (LASIX) 20 MG tablet Take 1 tablet (20 mg total) by mouth daily. (Patient taking differently: Take 20 mg by mouth daily. M-W-F)   No facility-administered encounter medications on file as of 03/13/2019.      SIGNIFICANT DIAGNOSTIC EXAMS   PREVIOUS;   02-23-19: chest x-ray: Congestive heart failure superimposed on interstitial fibrosis, and right greater than left pleural effusion. Surgical changes of median  sternotomy and CABG  02-24-19: 2-d echo:   1. The left ventricle has normal systolic function, with an ejection fraction of 55-60%. There is anterosepatal hypokinesis. The cavity size was normal. There is mildly increased left ventricular wall thickness. Left ventricular diastolic Doppler parameters are indeterminate. There is right ventricular volume overload.  2. The right ventricle has normal systolic function. The cavity was normal. There is no increase in right ventricular wall thickness. Right ventricular systolic pressure is moderately elevated with an estimated pressure of 52.4 mmHg.  3. Left atrial size was severely dilated.  4. Right atrial size was severely dilated.  5. The aortic valve is tricuspid. Mild calcification of the aortic valve. Aortic valve regurgitation is mild by color flow Doppler. Moderate aortic annular calcification noted.  6. The mitral valve is grossly normal. Mild thickening of the mitral valve leaflet. There is mild mitral annular calcification present.  7. The tricuspid valve is grossly normal. There is mild tricuspid regurgitation.  8. The aortic root is normal in size and structure.  9. The inferior vena cava was dilated in size with >50% respiratory variability.  03-05-19: chest x-ray: Improved aeration since 02/23/2019. Cardiomegaly with small pleural effusions or thickening, but decreased interstitial edema. There is underlying chronic interstitial lung disease.  NO  NEW EXAMS.   LABS REVIEWED PREVIOUS:   04-12-18: chol 130; ldl 75 trig 121; hdl 31  02-23-19: wbc 6.9; hgb 10.8; hct 34.8; mcv 89.7; plt 254; glucose 126; bun 23; creat 1.20 ;k+ 5.0; na++ 137; ca 8.9; mag 1.7 BNP 225.0; INR 2.4 02-24-19: tsh 1.106 dig 0.7  02-25-19: wbc 7.7; hgb 10.5; hct 34.5; mcv 90.6 plt 282; glucose 111; bun 24; creat 1.31; k+ 4.2; na++ 138; ca 9.0;  02-26-19: INR 2.4 03-02-19: glucose 113; bun 24; creat 1.15; k+ 4.6; na++ 131; ca 9.2 INR  2.4  03-09-19: wbc 10.8; hgb 12.2; hct 38.6; mcv 86.7 plt 274 glucose 130; bun 41; creat 0.94; k+ 4.8; na++ 130; ca 9.4; liver normal albumin 3.4 INR 2.1   NO NEW LABS.   Review of Systems  Constitutional: Positive for malaise/fatigue.  Respiratory: Positive for cough, sputum production and shortness of breath.   Cardiovascular: Negative for chest pain, palpitations and leg swelling.  Gastrointestinal: Negative for abdominal pain, constipation and heartburn.       Poor appetite   Musculoskeletal: Negative for back pain, joint pain and myalgias.  Skin: Negative.   Neurological: Positive for dizziness and weakness.  Psychiatric/Behavioral: The patient is not nervous/anxious.      Physical Exam Constitutional:      General: He is not in acute distress.    Appearance: He is underweight. He is not diaphoretic.  Eyes:     Comments: History of bilateral cataract removal and lens implant   Neck:     Musculoskeletal: Neck supple.     Thyroid: No thyromegaly.  Cardiovascular:     Rate and Rhythm: Normal rate. Rhythm irregular.     Pulses: Normal pulses.     Heart sounds: Normal heart sounds.     Comments: History of CABG  Pulmonary:     Effort: Pulmonary effort is normal. No respiratory distress.     Breath sounds: Normal breath sounds.     Comments: 02 dependent  Abdominal:     General: Bowel sounds are normal. There is no distension.     Palpations: Abdomen is soft.     Tenderness: There is no abdominal tenderness.     Comments:  History of esophageal dilatation       Musculoskeletal:     Right lower leg: No edema.     Left lower leg: No edema.     Comments: Slight kyphosis  History of lumbar surgery     Is able to move all extremities   Lymphadenopathy:     Cervical: No cervical adenopathy.  Skin:    General: Skin is warm and dry.  Neurological:     Mental Status: He is alert and oriented to person, place, and time.  Psychiatric:        Mood and Affect: Mood normal.     ASSESSMENT/ PLAN:  TODAY:   1. Failure to thrive in adult: weight is 144 pounds; will stop the remeron due to poor tolerance; will continue supplements as directed.  2. Chronic diastolic heart failure EF 55-60% (02-24-19) will continue lasix 20 mg three times weekly  3. Interstitial pulmonary fibrosis (on chest x-ray 02-23-19) is without change has chronic cough with sputum production; will continue robitussin 100 mg every 4 hours while awake is on chronic 02 therapy.     PREVIOUS  4. GERD without esophagitis: is stable will continue protonix 40 mg daily  5. Mixed hyperlipidemia: is stable LDL 75; due to poor appetite will stop lipitor   6. Permanent atrial fibrillation: heart rate stable is on long term coumadin therapy.   7. Coronary artery disease involving coronary artery bypass graft of native heart with angina pectoris: is stable is on long term coumadin therapy and has prn ntg  8. Chronic recurrent major depressive disorder: is without change: will lower zoloft to 25 mg daily will stop xanax due to non use  9. Chronic anemia: hgb 12.2 is stable will monitor  10. Dysphagia: is without signs of aspiration present; will not make changes will monitor his status.    MD is aware of resident's narcotic use and is in agreement with current plan of care. We will attempt to wean resident as apropriate   Ok Edwards NP [facility]  Contact [phone] Monday through Friday 8am- 5pm  After hours call [phone]

## 2019-03-14 ENCOUNTER — Encounter (HOSPITAL_COMMUNITY)
Admission: RE | Admit: 2019-03-14 | Discharge: 2019-03-14 | Disposition: A | Discharge status: Home / Self Care | Payer: Medicare Other | Source: Skilled Nursing Facility | Attending: Internal Medicine | Admitting: Internal Medicine

## 2019-03-14 LAB — CBC WITH DIFFERENTIAL/PLATELET
Abs Immature Granulocytes: 0.05 10*3/uL (ref 0.00–0.07)
Basophils Absolute: 0 10*3/uL (ref 0.0–0.1)
Basophils Relative: 0 %
Eosinophils Absolute: 0.2 10*3/uL (ref 0.0–0.5)
Eosinophils Relative: 2 %
HCT: 39.9 % (ref 39.0–52.0)
Hemoglobin: 12.8 g/dL — ABNORMAL LOW (ref 13.0–17.0)
Immature Granulocytes: 0 %
Lymphocytes Relative: 17 %
Lymphs Abs: 1.9 10*3/uL (ref 0.7–4.0)
MCH: 28 pg (ref 26.0–34.0)
MCHC: 32.1 g/dL (ref 30.0–36.0)
MCV: 87.3 fL (ref 80.0–100.0)
Monocytes Absolute: 1.1 10*3/uL — ABNORMAL HIGH (ref 0.1–1.0)
Monocytes Relative: 9 %
Neutro Abs: 7.9 10*3/uL — ABNORMAL HIGH (ref 1.7–7.7)
Neutrophils Relative %: 72 %
Platelets: 242 10*3/uL (ref 150–400)
RBC: 4.57 MIL/uL (ref 4.22–5.81)
RDW: 16.8 % — ABNORMAL HIGH (ref 11.5–15.5)
WBC: 11.1 10*3/uL — ABNORMAL HIGH (ref 4.0–10.5)
nRBC: 0 % (ref 0.0–0.2)

## 2019-03-14 LAB — COMPREHENSIVE METABOLIC PANEL
ALT: 28 U/L (ref 0–44)
AST: 23 U/L (ref 15–41)
Albumin: 3.4 g/dL — ABNORMAL LOW (ref 3.5–5.0)
Alkaline Phosphatase: 134 U/L — ABNORMAL HIGH (ref 38–126)
Anion gap: 12 (ref 5–15)
BUN: 42 mg/dL — ABNORMAL HIGH (ref 8–23)
CO2: 25 mmol/L (ref 22–32)
Calcium: 9.2 mg/dL (ref 8.9–10.3)
Chloride: 98 mmol/L (ref 98–111)
Creatinine, Ser: 1.1 mg/dL (ref 0.61–1.24)
GFR calc Af Amer: >60 mL/min (ref >60)
GFR calc non Af Amer: 60 mL/min — ABNORMAL LOW (ref >60)
Glucose, Bld: 129 mg/dL — ABNORMAL HIGH (ref 70–99)
Potassium: 4.6 mmol/L (ref 3.5–5.1)
Sodium: 135 mmol/L (ref 135–145)
Total Bilirubin: 0.8 mg/dL (ref 0.3–1.2)
Total Protein: 7.7 g/dL (ref 6.5–8.1)

## 2019-03-16 ENCOUNTER — Other Ambulatory Visit: Payer: Self-pay | Admitting: Adult Health

## 2019-03-16 ENCOUNTER — Non-Acute Institutional Stay (SKILLED_NURSING_FACILITY): Payer: Medicare Other | Admitting: Adult Health

## 2019-03-16 ENCOUNTER — Encounter (HOSPITAL_COMMUNITY)
Admission: RE | Admit: 2019-03-16 | Discharge: 2019-03-16 | Disposition: A | Discharge status: Home / Self Care | Payer: Medicare Other | Source: Skilled Nursing Facility | Attending: *Deleted | Admitting: *Deleted

## 2019-03-16 ENCOUNTER — Encounter: Payer: Self-pay | Admitting: Adult Health

## 2019-03-16 DIAGNOSIS — R627 Adult failure to thrive: Secondary | ICD-10-CM | POA: Diagnosis not present

## 2019-03-16 DIAGNOSIS — J8410 Pulmonary fibrosis, unspecified: Secondary | ICD-10-CM | POA: Diagnosis not present

## 2019-03-16 DIAGNOSIS — I5032 Chronic diastolic (congestive) heart failure: Secondary | ICD-10-CM | POA: Diagnosis not present

## 2019-03-16 LAB — CBC WITH DIFFERENTIAL/PLATELET
Abs Immature Granulocytes: 0.02 10*3/uL (ref 0.00–0.07)
Basophils Absolute: 0.1 10*3/uL (ref 0.0–0.1)
Basophils Relative: 1 %
Eosinophils Absolute: 0.3 10*3/uL (ref 0.0–0.5)
Eosinophils Relative: 3 %
HCT: 37.6 % — ABNORMAL LOW (ref 39.0–52.0)
Hemoglobin: 11.7 g/dL — ABNORMAL LOW (ref 13.0–17.0)
Immature Granulocytes: 0 %
Lymphocytes Relative: 17 %
Lymphs Abs: 1.7 10*3/uL (ref 0.7–4.0)
MCH: 27.8 pg (ref 26.0–34.0)
MCHC: 31.1 g/dL (ref 30.0–36.0)
MCV: 89.3 fL (ref 80.0–100.0)
Monocytes Absolute: 0.9 10*3/uL (ref 0.1–1.0)
Monocytes Relative: 9 %
Neutro Abs: 6.8 10*3/uL (ref 1.7–7.7)
Neutrophils Relative %: 70 %
Platelets: 204 10*3/uL (ref 150–400)
RBC: 4.21 MIL/uL — ABNORMAL LOW (ref 4.22–5.81)
RDW: 16.9 % — ABNORMAL HIGH (ref 11.5–15.5)
WBC: 9.7 10*3/uL (ref 4.0–10.5)
nRBC: 0 % (ref 0.0–0.2)

## 2019-03-16 LAB — COMPREHENSIVE METABOLIC PANEL
ALT: 37 U/L (ref 0–44)
AST: 34 U/L (ref 15–41)
Albumin: 3.1 g/dL — ABNORMAL LOW (ref 3.5–5.0)
Alkaline Phosphatase: 111 U/L (ref 38–126)
Anion gap: 9 (ref 5–15)
BUN: 41 mg/dL — ABNORMAL HIGH (ref 8–23)
CO2: 27 mmol/L (ref 22–32)
Calcium: 9.1 mg/dL (ref 8.9–10.3)
Chloride: 99 mmol/L (ref 98–111)
Creatinine, Ser: 1.01 mg/dL (ref 0.61–1.24)
GFR calc Af Amer: >60 mL/min (ref >60)
GFR calc non Af Amer: >60 mL/min (ref >60)
Glucose, Bld: 140 mg/dL — ABNORMAL HIGH (ref 70–99)
Potassium: 5.1 mmol/L (ref 3.5–5.1)
Sodium: 135 mmol/L (ref 135–145)
Total Bilirubin: 0.5 mg/dL (ref 0.3–1.2)
Total Protein: 6.7 g/dL (ref 6.5–8.1)

## 2019-03-16 LAB — TSH: TSH: 1.041 u[IU]/mL (ref 0.350–4.500)

## 2019-03-16 MED ORDER — DRONABINOL 2.5 MG PO CAPS: 2.5000 mg | ORAL_CAPSULE | Freq: Every day | ORAL | 0 refills | Status: AC

## 2019-03-16 NOTE — Progress Notes (Addendum)
Location:   [place] Room Number: 128 P Place of Service:  SNF (31)   CODE STATUS: DNR  Allergies  Allergen Reactions  . Sulfamethoxazole     Other reaction(s): Red Man Syndrome (ALLERGY)  . Sulfonamide Derivatives     Patient says his wife says he's allergic to sulfa.    Chief Complaint  Patient presents with  . Acute Visit    Dehydration    HPI:  His status is declining. He is not eating or drinking. He tells me that he gets congested when he tries to eat. He does not have an appetite. He denies any pain. There are no reports of fevers present.  We discussed his advanced directives including cpr; tube feeding hospitalizations; with pros and cons of each decision. He does not want CPR; he does not want a tube feeding.  I have spoken with his daughter; she agrees to the DNR and no tube feeding. Will stop his zoloft due to poor tolerance. She is interested in an appetite stimulant. She does understand that his prognosis is very poor.    Past Medical History:  Diagnosis Date  . Atrial fibrillation ([place])   . Cancer ([place])   . Coronary atherosclerosis of native coronary artery    Prior stenting of LAD and diagonal at [facility] with subsequent SVG to diagonal, LVEF 55-60%  . Dysphagia   . GERD   . Hiccups   . History of prostate cancer   . Mixed hyperlipidemia   . Weight loss     Past Surgical History:  Procedure Laterality Date  . Bilateral inguinal hernia repair    . bulging disc    . COLONOSCOPY  06/2010  . CORONARY ARTERY BYPASS GRAFT  1996    Emergent SVG to diagonal at [facility]  . ESOPHAGEAL DILATION N/A 06/10/2015   Procedure: ESOPHAGEAL DILATION;  Surgeon: Rogene Houston, MD;  Location: [facility] ENDO SUITE;  Service: Endoscopy;  Laterality: N/A;  . ESOPHAGOGASTRODUODENOSCOPY     w/esophageal dilation   . ESOPHAGOGASTRODUODENOSCOPY N/A 06/10/2015   Procedure: ESOPHAGOGASTRODUODENOSCOPY (EGD);  Surgeon: Rogene Houston, MD;  Location: [facility] ENDO  SUITE;  Service: Endoscopy;  Laterality: N/A;  245  . IR [place] LUMBAR INC FX REDUCE BONE BX UNI/BIL CANNULATION INC/IMAGING  03/07/2017  . IR RADIOLOGIST EVAL & MGMT  02/28/2017  . UPPER GASTROINTESTINAL ENDOSCOPY  06/2010    Social History   Socioeconomic History  . Marital status: Married    Spouse name: Not on file  . Number of children: Not on file  . Years of education: Not on file  . Highest education level: Not on file  Occupational History  . Not on file  Social Needs  . Financial resource strain: Patient refused  . Food insecurity:    Worry: Patient refused    Inability: Patient refused  . Transportation needs:    Medical: Patient refused    Non-medical: Patient refused  Tobacco Use  . Smoking status: Never Smoker  . Smokeless tobacco: Never Used  Substance and Sexual Activity  . Alcohol use: No    Alcohol/week: 0.0 standard drinks  . Drug use: No  . Sexual activity: Not on file  Lifestyle  . Physical activity:    Days per week: Patient refused    Minutes per session: Patient refused  . Stress: Patient refused  Relationships  . Social connections:    Talks on phone: Patient refused    Gets together: Patient refused  Attends religious service: Patient refused    Active member of club or organization: Patient refused    Attends meetings of clubs or organizations: Patient refused    Relationship status: Patient refused  . Intimate partner violence:    Fear of current or ex partner: Patient refused    Emotionally abused: Patient refused    Physically abused: Patient refused    Forced sexual activity: Patient refused  Other Topics Concern  . Not on file  Social History Narrative   [name]. Has 1 child from 1st marriage and 2 step children from second.   Family History  Problem Relation Age of Onset  . Coronary artery disease Father       VITAL SIGNS BP 115/76   Pulse 87   Temp 98.1 F (36.7 C)   Resp 18   Ht '5\' 10"'$  (1.778 m)   Wt  139 lb 3.2 oz (63.1 kg)   SpO2 98%   BMI 19.97 kg/m   Outpatient Encounter Medications as of 03/16/2019  Medication Sig  . acetaminophen (TYLENOL) 325 MG tablet Take 2 tablets (650 mg total) by mouth every 6 (six) hours as needed for mild pain (or Fever >/= 101).  Roseanne Kaufman [place]-Castor Oil (VENELEX) OINT Apply externally to sacrum and bilateral buttocks every shift and as needed  . cholecalciferol (VITAMIN D) 1000 UNITS tablet Take 1,000 Units by mouth daily.  . cyanocobalamin 500 MCG tablet Take 500 mcg by mouth daily.  Marland Kitchen guaiFENesin (ROBITUSSIN) 100 MG/5ML SOLN Take 5 mLs by mouth 4 (four) times daily. Eery 4 hours during waking hours  . Multiple Vitamin (MULTIVITAMIN WITH MINERALS) TABS tablet Take 1 tablet by mouth daily.  . nitroGLYCERIN (NITROSTAT) 0.4 MG SL tablet Place 1 tablet (0.4 mg total) under the tongue every 5 (five) minutes as needed.  . NON FORMULARY Diet Type:  Mechanical soft  . Nutritional Supplements (ENSURE ENLIVE PO) Take 237 mLs by mouth 3 (three) times daily before meals. And at bedtime  . Ostomy Supplies (SKIN PREP WIPES) MISC Apply to bilateral heels every shift for prevention  . OXYGEN Inhale 2 L/min into the lungs as needed. For SOB, dyspnea or sats < 90  . pantoprazole (PROTONIX) 40 MG tablet Take 1 tablet (40 mg total) by mouth daily before breakfast.  . Propylene Glycol (SYSTANE BALANCE OP) Place 2 drops into both eyes daily as needed (for dry eye relief).   . warfarin (COUMADIN) 3 MG tablet Take 3 mg by mouth every evening.   . [DISCONTINUED] ALPRAZolam (XANAX) 0.25 MG tablet Take 1 tablet (0.25 mg total) by mouth 2 (two) times daily as needed for up to 13 days for anxiety. (Patient not taking: Reported on 03/16/2019)  . [DISCONTINUED] atorvastatin (LIPITOR) 20 MG tablet Take 1 tablet (20 mg total) by mouth at bedtime. (Patient not taking: Reported on 03/16/2019)  . [DISCONTINUED] feeding supplement, ENSURE ENLIVE, (ENSURE ENLIVE) LIQD Take 237 mLs by mouth 2 (two)  times daily between meals. (Patient not taking: Reported on 03/16/2019)  . [DISCONTINUED] furosemide (LASIX) 20 MG tablet Take 20 mg by mouth daily. M- W- F  . [DISCONTINUED] mirtazapine (REMERON) 7.5 MG tablet Take 7.5 mg by mouth at bedtime.  . [DISCONTINUED] sertraline (ZOLOFT) 50 MG tablet Take 1 tablet (50 mg total) by mouth daily. (Patient not taking: Reported on 03/16/2019)   No facility-administered encounter medications on file as of 03/16/2019.      SIGNIFICANT DIAGNOSTIC EXAMS   PREVIOUS;   02-23-19:  chest x-ray: Congestive heart failure superimposed on interstitial fibrosis, and right greater than left pleural effusion. Surgical changes of median sternotomy and CABG  02-24-19: 2-d echo:   1. The left ventricle has normal systolic function, with an ejection fraction of 55-60%. There is anterosepatal hypokinesis. The cavity size was normal. There is mildly increased left ventricular wall thickness. Left ventricular diastolic Doppler parameters are indeterminate. There is right ventricular volume overload.  2. The right ventricle has normal systolic function. The cavity was normal. There is no increase in right ventricular wall thickness. Right ventricular systolic pressure is moderately elevated with an estimated pressure of 52.4 mmHg.  3. Left atrial size was severely dilated.  4. Right atrial size was severely dilated.  5. The aortic valve is tricuspid. Mild calcification of the aortic valve. Aortic valve regurgitation is mild by color flow Doppler. Moderate aortic annular calcification noted.  6. The mitral valve is grossly normal. Mild thickening of the mitral valve leaflet. There is mild mitral annular calcification present.  7. The tricuspid valve is grossly normal. There is mild tricuspid regurgitation.  8. The aortic root is normal in size and structure.  9. The inferior vena cava was dilated in size with >50% respiratory variability.  03-05-19: chest x-ray: Improved aeration  since 02/23/2019. Cardiomegaly with small pleural effusions or thickening, but decreased interstitial edema. There is underlying chronic interstitial lung disease.  NO NEW EXAMS.   LABS REVIEWED PREVIOUS:   04-12-18: chol 130; ldl 75 trig 121; hdl 31  02-23-19: wbc 6.9; hgb 10.8; hct 34.8; mcv 89.7; plt 254; glucose 126; bun 23; creat 1.20 ;k+ 5.0; na++ 137; ca 8.9; mag 1.7 BNP 225.0; INR 2.4 02-24-19: tsh 1.106 dig 0.7  02-25-19: wbc 7.7; hgb 10.5; hct 34.5; mcv 90.6 plt 282; glucose 111; bun 24; creat 1.31; k+ 4.2; na++ 138; ca 9.0;  02-26-19: INR 2.4 03-02-19: glucose 113; bun 24; creat 1.15; k+ 4.6; na++ 131; ca 9.2 INR  2.4  03-09-19: wbc 10.8; hgb 12.2; hct 38.6; mcv 86.7 plt 274 glucose 130; bun 41; creat 0.94; k+ 4.8; na++ 130; ca 9.4; liver normal albumin 3.4 INR 2.1   TODAY:  03-14-19: wbc 11.1; hgb 12.8; hct 39.9; mcv 87.3 plt 242  glucose 129; bun 42; creat 1.10 k+ 4.6 na++ 130 ca 9.2; alk phos 134 albumin 3.4  03-15-28: wbc 9.7 hgb 11.7; hct 37.6; mcv 89.3; plt 204; glucose 140; bun 41; creat 1.01; k+ 5.1; na++ 135; ca 9.1 liver normal albumin 3.1  tsh 1.041   Review of Systems  Constitutional: Positive for malaise/fatigue.       No appetite   Respiratory: Positive for cough and sputum production. Negative for shortness of breath.   Cardiovascular: Negative for chest pain, palpitations and leg swelling.  Gastrointestinal: Negative for abdominal pain, constipation and heartburn.  Musculoskeletal: Negative for back pain, joint pain and myalgias.  Skin: Negative.   Neurological: Negative for dizziness.  Psychiatric/Behavioral: The patient is not nervous/anxious.    Physical Exam Constitutional:      General: He is not in acute distress.    Appearance: He is underweight. He is not diaphoretic.  Eyes:     Comments: : History of bilateral cataract removal and lens implant    Neck:     Musculoskeletal: Neck supple.     Thyroid: No thyromegaly.  Cardiovascular:     Rate and  Rhythm: Normal rate. Rhythm irregular.     Pulses: Normal pulses.     Heart sounds:  Normal heart sounds.     Comments:  History of CABG  Pulmonary:     Effort: Pulmonary effort is normal. No respiratory distress.     Breath sounds: Normal breath sounds.     Comments: 02 dependent Abdominal:     General: Bowel sounds are normal. There is no distension.     Palpations: Abdomen is soft.     Tenderness: There is no abdominal tenderness.  Musculoskeletal:     Right lower leg: No edema.     Left lower leg: No edema.     Comments: Slight kyphosis  History of lumbar surgery     Is able to move all extremities    Lymphadenopathy:     Cervical: No cervical adenopathy.  Skin:    General: Skin is warm and dry.  Neurological:     Mental Status: He is alert and oriented to person, place, and time.  Psychiatric:     Comments: Depressed affect        ASSESSMENT/ PLAN:  TODAY;   1. Chronic diastolic heart failure 2. Interstitial pulmonary fibrosis 3. Adult failure to thrive  Will stop zoloft Will change lasix to 20 mg daily as needed for weight gain >3 pounds Will begin atrovent nasal spray 2 puffs prior to meals to help with congestion Will begin marinol 2.5 mg prior to lunch for 30 days  DNR and no tube feeding.   Time spent with patient and family 40 minutes ( 20 minutes on advanced directives): MOST form filled out; discussed his prognosis with family verbalized understanding.       MD is aware of resident's narcotic use and is in agreement with current plan of care. We will attempt to wean resident as apropriate   Ok Edwards NP [facility]  Contact [phone] Monday through Friday 8am- 5pm  After hours call [phone]

## 2019-03-18 ENCOUNTER — Non-Acute Institutional Stay (SKILLED_NURSING_FACILITY): Payer: Medicare Other | Admitting: Adult Health

## 2019-03-18 ENCOUNTER — Encounter: Payer: Self-pay | Admitting: Adult Health

## 2019-03-18 ENCOUNTER — Other Ambulatory Visit: Payer: Self-pay | Admitting: Adult Health

## 2019-03-18 DIAGNOSIS — R627 Adult failure to thrive: Secondary | ICD-10-CM

## 2019-03-18 DIAGNOSIS — K219 Gastro-esophageal reflux disease without esophagitis: Secondary | ICD-10-CM

## 2019-03-18 DIAGNOSIS — J8410 Pulmonary fibrosis, unspecified: Secondary | ICD-10-CM

## 2019-03-18 NOTE — Progress Notes (Signed)
Location:   [place] Room Number: 128 P Place of Service:  SNF (31)   CODE STATUS: DNR  Allergies  Allergen Reactions   Sulfamethoxazole     Other reaction(s): Red Man Syndrome (ALLERGY)   Sulfonamide Derivatives     Patient says his wife says he's allergic to sulfa.    Chief Complaint  Patient presents with   Medical Management of Chronic Issues    Interstitial pulmonary fibrosis; gastroesophageal reflux disease without esophagitis; adult failure to thrive. Weekly follow up for the first 30 days post hospitalization.     HPI:  He is a 85 year old short term rehab patient being seen for the management of his chronic illnesses: pulmonary fibrosis; gerd; ftt. He continues to decline. His appetite is poor. He declines any pain. There are no reports of fevers present. Social worker has discussed him going home with his family. At this time he will require 24 hour care. He is mainly bed bound. Dr. Luan Pulling has called; will setup a palliative care consult. He will talk with the family and have them reach out to hospice for possible discharge with hospice care.    Past Medical History:  Diagnosis Date   Atrial fibrillation ([place])    Cancer ([place])    Coronary atherosclerosis of native coronary artery    Prior stenting of LAD and diagonal at [facility] with subsequent SVG to diagonal, LVEF 55-60%   Dysphagia    GERD    Hiccups    History of prostate cancer    Mixed hyperlipidemia    Weight loss     Past Surgical History:  Procedure Laterality Date   Bilateral inguinal hernia repair     bulging disc     COLONOSCOPY  06/2010   CORONARY ARTERY BYPASS GRAFT  1996    Emergent SVG to diagonal at [place] N/A 06/10/2015   Procedure: [place];  Surgeon: Rogene Houston, MD;  Location: [facility] ENDO SUITE;  Service: Endoscopy;  Laterality: N/A;   ESOPHAGOGASTRODUODENOSCOPY     w/esophageal dilation     ESOPHAGOGASTRODUODENOSCOPY N/A 06/10/2015   Procedure: ESOPHAGOGASTRODUODENOSCOPY (EGD);  Surgeon: Rogene Houston, MD;  Location: [facility] ENDO SUITE;  Service: Endoscopy;  Laterality: N/A;  245   IR [place] FX REDUCE BONE BX UNI/BIL CANNULATION INC/IMAGING  03/07/2017   IR RADIOLOGIST EVAL & MGMT  02/28/2017   UPPER GASTROINTESTINAL ENDOSCOPY  06/2010    Social History   Socioeconomic History   Marital status: Married    Spouse name: Not on file   Number of children: Not on file   Years of education: Not on file   Highest education level: Not on file  Occupational History   Not on file  Social Needs   Financial resource strain: Patient refused   Food insecurity:    Worry: Patient refused    Inability: Patient refused   Transportation needs:    Medical: Patient refused    Non-medical: Patient refused  Tobacco Use   Smoking status: Never Smoker   Smokeless tobacco: Never Used  Substance and Sexual Activity   Alcohol use: No    Alcohol/week: 0.0 standard drinks   Drug use: No   Sexual activity: Not on file  Lifestyle   Physical activity:    Days per week: Patient refused    Minutes per session: Patient refused   Stress: Patient refused  Relationships   Social connections:    Talks on phone:  Patient refused    Gets together: Patient refused    Attends religious service: Patient refused    Active member of club or organization: Patient refused    Attends meetings of clubs or organizations: Patient refused    Relationship status: Patient refused   Intimate partner violence:    Fear of current or ex partner: Patient refused    Emotionally abused: Patient refused    Physically abused: Patient refused    Forced sexual activity: Patient refused  Other Topics Concern   Not on file  Social History Narrative   [name]. Has 1 child from 1st marriage and 2 step children from second.   Family History  Problem Relation Age of Onset    Coronary artery disease Father       VITAL SIGNS BP 113/79    Pulse 93    Temp 98.2 F (36.8 C)    Resp 20    Ht _0  (1.778 m)    Wt 141 lb 9.6 oz (64.2 kg)    SpO2 97%    BMI 20.32 kg/m   Outpatient Encounter Medications as of 03/18/2019  Medication Sig   acetaminophen (TYLENOL) 325 MG tablet Take 2 tablets (650 mg total) by mouth every 6 (six) hours as needed for mild pain (or Fever >/= 101).   Balsam [place]-Castor Oil (VENELEX) OINT Apply externally to sacrum and bilateral buttocks every shift and as needed   cholecalciferol (VITAMIN D) 1000 UNITS tablet Take 1,000 Units by mouth daily.   cyanocobalamin 500 MCG tablet Take 500 mcg by mouth daily.   dronabinol (MARINOL) 2.5 MG capsule Take 1 capsule (2.5 mg total) by mouth daily before lunch.   guaiFENesin (ROBITUSSIN) 100 MG/5ML SOLN Take 5 mLs by mouth 4 (four) times daily. Eery 4 hours during waking hours   ipratropium (ATROVENT) 0.03 % nasal spray Place 2 sprays into both nostrils. Daily before meals   Multiple Vitamin (MULTIVITAMIN WITH MINERALS) TABS tablet Take 1 tablet by mouth daily.   nitroGLYCERIN (NITROSTAT) 0.4 MG SL tablet Place 1 tablet (0.4 mg total) under the tongue every 5 (five) minutes as needed.   NON FORMULARY Diet Type:  Mechanical soft   Nutritional Supplements (ENSURE ENLIVE PO) Take 237 mLs by mouth 3 (three) times daily before meals. And at bedtime   Ostomy Supplies (SKIN PREP WIPES) MISC Apply to bilateral heels every shift for prevention   OXYGEN Inhale 2 L/min into the lungs as needed. For SOB, dyspnea or sats < 90   pantoprazole (PROTONIX) 40 MG tablet Take 1 tablet (40 mg total) by mouth daily before breakfast.   Propylene Glycol (SYSTANE BALANCE OP) Place 2 drops into both eyes daily as needed (for dry eye relief).    warfarin (COUMADIN) 3 MG tablet Take 3 mg by mouth every evening.    No facility-administered encounter medications on file as of 03/18/2019.      SIGNIFICANT DIAGNOSTIC  EXAMS  PREVIOUS;   02-23-19: chest x-ray: Congestive heart failure superimposed on interstitial fibrosis, and right greater than left pleural effusion. Surgical changes of median sternotomy and CABG  02-24-19: 2-d echo:   1. The left ventricle has normal systolic function, with an ejection fraction of 55-60%. There is anterosepatal hypokinesis. The cavity size was normal. There is mildly increased left ventricular wall thickness. Left ventricular diastolic Doppler parameters are indeterminate. There is right ventricular volume overload.  2. The right ventricle has normal systolic function. The cavity was normal. There is  no increase in right ventricular wall thickness. Right ventricular systolic pressure is moderately elevated with an estimated pressure of 52.4 mmHg.  3. Left atrial size was severely dilated.  4. Right atrial size was severely dilated.  5. The aortic valve is tricuspid. Mild calcification of the aortic valve. Aortic valve regurgitation is mild by color flow Doppler. Moderate aortic annular calcification noted.  6. The mitral valve is grossly normal. Mild thickening of the mitral valve leaflet. There is mild mitral annular calcification present.  7. The tricuspid valve is grossly normal. There is mild tricuspid regurgitation.  8. The aortic root is normal in size and structure.  9. The inferior vena cava was dilated in size with >50% respiratory variability.  03-05-19: chest x-ray: Improved aeration since 02/23/2019. Cardiomegaly with small pleural effusions or thickening, but decreased interstitial edema. There is underlying chronic interstitial lung disease.  NO NEW EXAMS.   LABS REVIEWED PREVIOUS:   04-12-18: chol 130; ldl 75 trig 121; hdl 31  02-23-19: wbc 6.9; hgb 10.8; hct 34.8; mcv 89.7; plt 254; glucose 126; bun 23; creat 1.20 ;k+ 5.0; na++ 137; ca 8.9; mag 1.7 BNP 225.0; INR 2.4 02-24-19: tsh 1.106 dig 0.7  02-25-19: wbc 7.7; hgb 10.5; hct 34.5; mcv 90.6 plt 282; glucose  111; bun 24; creat 1.31; k+ 4.2; na++ 138; ca 9.0;  02-26-19: INR 2.4 03-02-19: glucose 113; bun 24; creat 1.15; k+ 4.6; na++ 131; ca 9.2 INR  2.4  03-09-19: wbc 10.8; hgb 12.2; hct 38.6; mcv 86.7 plt 274 glucose 130; bun 41; creat 0.94; k+ 4.8; na++ 130; ca 9.4; liver normal albumin 3.4 INR 2.1  03-14-19: wbc 11.1; hgb 12.8; hct 39.9; mcv 87.3 plt 242  glucose 129; bun 42; creat 1.10 k+ 4.6 na++ 130 ca 9.2; alk phos 134 albumin 3.4  03-15-28: wbc 9.7 hgb 11.7; hct 37.6; mcv 89.3; plt 204; glucose 140; bun 41; creat 1.01; k+ 5.1; na++ 135; ca 9.1 liver normal albumin 3.1  tsh 1.041  NO NEW LABS.   Review of Systems  Constitutional: Positive for malaise/fatigue.       No appetite   Respiratory: Positive for cough and shortness of breath.   Cardiovascular: Negative for chest pain, palpitations and leg swelling.  Gastrointestinal: Negative for abdominal pain, constipation and heartburn.  Musculoskeletal: Negative for back pain, joint pain and myalgias.  Skin: Negative.   Neurological: Negative for dizziness.  Psychiatric/Behavioral: The patient is not nervous/anxious.      Physical Exam Constitutional:      General: He is not in acute distress.    Appearance: He is underweight. He is not diaphoretic.  Eyes:     Comments:  History of bilateral cataract removal and lens implant    Neck:     Musculoskeletal: Neck supple.     Thyroid: No thyromegaly.  Cardiovascular:     Rate and Rhythm: Normal rate. Rhythm irregular.     Pulses: Normal pulses.     Heart sounds: Normal heart sounds.     Comments: History of CABG  Pulmonary:     Effort: Pulmonary effort is normal. No respiratory distress.     Breath sounds: Normal breath sounds.     Comments: 02 dependent  Abdominal:     General: Bowel sounds are normal. There is no distension.     Palpations: Abdomen is soft.     Tenderness: There is no abdominal tenderness.  Musculoskeletal:     Right lower leg: No edema.     Left  lower leg: No  edema.     Comments: Slight kyphosis  History of lumbar surgery     Is able to move all extremities     Lymphadenopathy:     Cervical: No cervical adenopathy.  Skin:    General: Skin is warm and dry.  Neurological:     Mental Status: He is alert and oriented to person, place, and time.  Psychiatric:        Mood and Affect: Mood normal.      ASSESSMENT/ PLAN:   TODAY:   1. Failure to thrive in adult: weight is 141 (previous 144) pounds; will continue marinol 2.5 mf prior to lunch for 30 days therapy; will continue ensure three times daily   2. Interstitial pulmonary fibrosis (chest x-ray 02-23-19) is without change; has large amount sputum production will continue robitussin 100 mg every 4 hours while awake; on 02 therapy. Will continue atrovent nasal spray prior to meals  3. GERD without esophagitis: is stable will continue protonix 40 mg daily    PREVIOUS  4. Chronic diastolic heart failure EF 55-60% (02-24-19) will continue lasix 20 mg daily for weight gain >3 pounds   5. Mixed hyperlipidemia: is stable LDL 75; is off lipitor   6. Permanent atrial fibrillation: heart rate stable is on long term coumadin therapy.   7. Coronary artery disease involving coronary artery bypass graft of native heart with angina pectoris: is stable is on long term coumadin therapy and has prn ntg  8. Chronic recurrent major depressive disorder: is without change: is presently off all medications   9. Chronic anemia: hgb 12.2 is stable will monitor  10. Dysphagia: is without signs of aspiration present; will not make changes will monitor his status.   Will setup a palliative care consult.    MD is aware of resident's narcotic use and is in agreement with current plan of care. We will attempt to wean resident as apropriate   Ok Edwards NP [facility]  Contact [phone] Monday through Friday 8am- 5pm  After hours call [phone]

## 2019-03-19 ENCOUNTER — Non-Acute Institutional Stay (SKILLED_NURSING_FACILITY): Payer: Medicare Other | Admitting: Internal Medicine

## 2019-03-19 ENCOUNTER — Encounter: Payer: Self-pay | Admitting: Internal Medicine

## 2019-03-19 DIAGNOSIS — R627 Adult failure to thrive: Secondary | ICD-10-CM | POA: Diagnosis not present

## 2019-03-19 DIAGNOSIS — I5032 Chronic diastolic (congestive) heart failure: Secondary | ICD-10-CM

## 2019-03-19 DIAGNOSIS — I4821 Permanent atrial fibrillation: Secondary | ICD-10-CM

## 2019-03-19 DIAGNOSIS — K224 Dyskinesia of esophagus: Secondary | ICD-10-CM

## 2019-03-19 NOTE — Progress Notes (Signed)
Location:  [facility]   Place of Service:  SNF [phone]) Provider:  Veleta Miners, MD  Sinda Du, MD  Patient Care Team: Sinda Du, MD as PCP - General (Pulmonary Disease) Satira Sark, MD as PCP - Cardiology (Cardiology)  Extended Emergency Contact Information Primary Emergency Contact: Docia Chuck [place] Phone: [phone] Mobile Phone: [phone] Relation: Daughter Secondary Emergency Contact: Collins [place] Mobile Phone: [phone] Relation: Son  Code Status:  Full Code Goals of care: Advanced Directive information Advanced Directives 03/18/2019  Does Patient Have a Medical Advance Directive? Yes  Type of Advance Directive Out of facility DNR (pink MOST or yellow form)  Does patient want to make changes to medical advance directive? No - Patient declined  Would patient like information on creating a medical advance directive? No - Patient declined  Pre-existing out of facility DNR order (yellow form or pink MOST form) Yellow form placed in chart (order not valid for inpatient use)     Chief Complaint  Patient presents with   Acute Visit    HPI:  Pt is a 85 y.o. male seen today for an acute visit for Continue Depression and Poor Appetite  Patient has a history of CAD status post CABG , hyperlipidemia , chronic atrial fibrillation on Coumadin, H/o Esophageal Dysphagia S/P barium in 2016 showed Severe Esophageal Dysmotility, ILD Patient was admitted after his family was unable to take care of him at home and brought him to hospital. In ED he also complained of edema in his LE and had CHF on his x-ray.  He was admitted with acute CHF. His EF is 55 to 60% with biatrial dilatation. He was seen by cardiology they discontinued his Digoxin and diuresed him with Lasix  Since patient has been in the facility he has progressively gotten worse.  He was unable to do therapy.  He is in the bed all the time.  He has lost almost 10  pounds in past 10 days.  He says he has no appetite.  He was started on Marinol few days ago. We had also tried him on Remeron but he became very lethargic was stopped.  His Zoloft was also discontinued.  He is only on Coumadin and Marinol now. Today patient with very lethargic.  He did not eat anything for his breakfast and lunch.  The nurses were really concerned about him He was responsive to me was alert and oriented.  He said he does not have any appetite and does not want to eat no nausea or vomiting no abdominal pain no fever , no chest pain  Past Medical History:  Diagnosis Date   Atrial fibrillation (HCC)    Cancer ([place])    Coronary atherosclerosis of native coronary artery    Prior stenting of LAD and diagonal at [facility] with subsequent SVG to diagonal, LVEF 55-60%   Dysphagia    GERD    Hiccups    History of prostate cancer    Mixed hyperlipidemia    Weight loss    Past Surgical History:  Procedure Laterality Date   Bilateral inguinal hernia repair     bulging disc     COLONOSCOPY  06/2010   CORONARY ARTERY BYPASS GRAFT  1996    Emergent SVG to diagonal at [place] N/A 06/10/2015   Procedure: ESOPHAGEAL DILATION;  Surgeon: Rogene Houston, MD;  [facility];  Service: Endoscopy;  Laterality: N/A;   ESOPHAGOGASTRODUODENOSCOPY  w/esophageal dilation    ESOPHAGOGASTRODUODENOSCOPY N/A 06/10/2015   Procedure: ESOPHAGOGASTRODUODENOSCOPY (EGD);  Surgeon: Rogene Houston, MD;  Location: [facility];  Service: Endoscopy;  Laterality: N/A;  245   IR KYPHO LUMBAR INC FX REDUCE BONE BX UNI/BIL CANNULATION INC/IMAGING  03/07/2017   IR RADIOLOGIST EVAL & MGMT  02/28/2017   UPPER GASTROINTESTINAL ENDOSCOPY  06/2010    Allergies  Allergen Reactions   Sulfamethoxazole     Other reaction(s): Red Man Syndrome (ALLERGY)   Sulfonamide Derivatives     Patient says his wife says he's allergic to sulfa.    Outpatient Encounter Medications as  of 03/19/2019  Medication Sig   acetaminophen (TYLENOL) 325 MG tablet Take 2 tablets (650 mg total) by mouth every 6 (six) hours as needed for mild pain (or Fever >/= 101).   Balsam [place]-Castor Oil (VENELEX) OINT Apply externally to sacrum and bilateral buttocks every shift and as needed   cholecalciferol (VITAMIN D) 1000 UNITS tablet Take 1,000 Units by mouth daily.   cyanocobalamin 500 MCG tablet Take 500 mcg by mouth daily.   dronabinol (MARINOL) 2.5 MG capsule Take 1 capsule (2.5 mg total) by mouth daily before lunch.   guaiFENesin (ROBITUSSIN) 100 MG/5ML SOLN Take 5 mLs by mouth 4 (four) times daily. Eery 4 hours during waking hours   ipratropium (ATROVENT) 0.03 % nasal spray Place 2 sprays into both nostrils. Daily before meals   Multiple Vitamin (MULTIVITAMIN WITH MINERALS) TABS tablet Take 1 tablet by mouth daily.   nitroGLYCERIN (NITROSTAT) 0.4 MG SL tablet Place 1 tablet (0.4 mg total) under the tongue every 5 (five) minutes as needed.   NON FORMULARY Diet Type:  Mechanical soft   Nutritional Supplements (ENSURE ENLIVE PO) Take 237 mLs by mouth 3 (three) times daily before meals. And at bedtime   Ostomy Supplies (SKIN PREP WIPES) MISC Apply to bilateral heels every shift for prevention   OXYGEN Inhale 2 L/min into the lungs as needed. For SOB, dyspnea or sats < 90   pantoprazole (PROTONIX) 40 MG tablet Take 1 tablet (40 mg total) by mouth daily before breakfast.   Propylene Glycol (SYSTANE BALANCE OP) Place 2 drops into both eyes daily as needed (for dry eye relief).    warfarin (COUMADIN) 3 MG tablet Take 3 mg by mouth every evening.    No facility-administered encounter medications on file as of 03/19/2019.     Review of Systems  Constitutional: Positive for activity change, appetite change and unexpected weight change.  HENT: Negative.   Respiratory: Positive for cough.   Cardiovascular: Negative.   Gastrointestinal: Negative.   Genitourinary: Negative.     Musculoskeletal: Positive for back pain.  Skin: Negative.   Neurological: Positive for weakness.  Psychiatric/Behavioral: Positive for confusion and dysphoric mood.    Immunization History  Administered Date(s) Administered   Influenza,inj,Quad PF,6+ Mos 08/25/2014   Pertinent  Health Maintenance Due  Topic Date Due   PNA vac Low Risk Adult (1 of 2 - PCV13) 03/30/2019 (Originally 10/05/1995)   INFLUENZA VACCINE  05/30/2019   No flowsheet data found. Functional Status Survey:    Vitals:   03/19/19 2131  BP: 94/70  Pulse: (!) 103  Resp: 18  Temp: (!) 97 F (36.1 C)  Weight: 140 lb (63.5 kg)   Body mass index is 20.09 kg/m. Physical Exam Vitals signs reviewed.  Constitutional:      Appearance: Normal appearance.  HENT:     Head: Normocephalic.     Nose: Nose  normal.     Mouth/Throat:     Mouth: Mucous membranes are dry.  Eyes:     Pupils: Pupils are equal, round, and reactive to light.  Neck:     Musculoskeletal: Neck supple.  Cardiovascular:     Rate and Rhythm: Normal rate and regular rhythm.     Pulses: Normal pulses.     Heart sounds: Normal heart sounds.  Pulmonary:     Effort: Pulmonary effort is normal.     Breath sounds: Normal breath sounds.  Abdominal:     General: Abdomen is flat. Bowel sounds are normal.     Palpations: Abdomen is soft.  Musculoskeletal:        General: No swelling.  Skin:    General: Skin is warm and dry.  Neurological:     General: No focal deficit present.     Mental Status: He is alert.  Psychiatric:        Mood and Affect: Mood is anxious and depressed.        Speech: Speech is delayed.        Behavior: Behavior is slowed.     Labs reviewed: Recent Labs    02/23/19 1739 02/24/19 0510  03/09/19 0731 03/14/19 1029 03/16/19 0600  NA 137 138   < > 130* 135 135  K 5.0 4.7   < > 4.8 4.6 5.1  CL 104 104   < > 93* 98 99  CO2 24 25   < > 24 25 27   GLUCOSE 126* 107*   < > 130* 129* 140*  BUN 23 20   < > 41* 42*  41*  CREATININE 1.20 1.29*   < > 0.94 1.10 1.01  CALCIUM 8.9 9.0   < > 9.4 9.2 9.1  MG 1.7 1.7  --   --   --   --    < > = values in this interval not displayed.   Recent Labs    03/09/19 0731 03/14/19 1029 03/16/19 0600  AST 31 23 34  ALT 34 28 37  ALKPHOS 120 134* 111  BILITOT 0.7 0.8 0.5  PROT 7.1 7.7 6.7  ALBUMIN 3.4* 3.4* 3.1*   Recent Labs    03/09/19 0731 03/14/19 1029 03/16/19 0600  WBC 10.8* 11.1* 9.7  NEUTROABS 6.9 7.9* 6.8  HGB 12.2* 12.8* 11.7*  HCT 38.6* 39.9 37.6*  MCV 86.7 87.3 89.3  PLT 274 242 204   Lab Results  Component Value Date   TSH 1.041 03/16/2019   No results found for: HGBA1C Lab Results  Component Value Date   CHOL 130 04/02/2018   HDL 31 (L) 04/02/2018   LDLCALC 75 04/02/2018   TRIG 121 04/02/2018   CHOLHDL 4.2 04/02/2018    Significant Diagnostic Results in last 30 days:  Dg Chest 2 View  Result Date: 03/05/2019 CLINICAL DATA:  Cough congestion EXAM: CHEST - 2 VIEW COMPARISON:  02/23/2019, 08/25/2015, CT 08/13/2018 FINDINGS: Post sternotomy changes. Small bilateral pleural effusions or pleural scarring. Interstitial fibrosis. Overall findings are improved as compared with 02/23/2019. Cardiomegaly with aortic atherosclerosis. No pneumothorax. IMPRESSION: Improved aeration since 02/23/2019. Cardiomegaly with small pleural effusions or thickening, but decreased interstitial edema. There is underlying chronic interstitial lung disease. Electronically Signed   By: Donavan Foil M.D.   On: 03/05/2019 19:35   Dg Chest 2 View  Result Date: 02/23/2019 CLINICAL DATA:  85 year old male with weakness and inability to walk EXAM: CHEST - 2 VIEW COMPARISON:  08/24/2017, CT 08/13/2018 FINDINGS:  Cardiomediastinal silhouette unchanged in size and contour. Surgical changes of median sternotomy and CABG. Reticular pattern of opacity of the bilateral lungs, worsening since the prior. Interlobular septal thickening. Opacities at the lung bases with blunting  of the bilateral costophrenic angles, worse on the right. Meniscus on the lateral view. IMPRESSION: Congestive heart failure superimposed on interstitial fibrosis, and right greater than left pleural effusion. Surgical changes of median sternotomy and CABG Electronically Signed   By: Corrie Mckusick D.O.   On: 02/23/2019 18:33    Assessment/Plan Failure to thrive in adult Patient has refused to eat for last 2 days. He was given some IV fluids over the weekend. I had a long discussion with him and his daughter. Patient did refuse to go to the hospital. I told his daughter that there is nothing much we can do at this time unless we do more aggressive testing. I have talked to the administration to have his daughter come and see him.  Tolld the daughter that we can make him comfort care and let him stay here and she and mom can come and visit him They also are contemplating to take him home with hospice Repeat CMP,CBC  Permanent atrial fibrillation Rate Control  Digoxin was stopped On Comadin Continue same dose of Coumdin Repeat INR Tomorrow  H/O Interstitial pulmonary fibrosis  Continue on Oxygen GERD Continue on Protonix Chronic anemia Hgb Stable Dysphagia His Appetite continues to be  very poor On D3 diet  Speech is working with him Depression Did not tolerate Remron On Marinol Chronic back Pain Continue on tylenol PRn   Family/ staff Communication:   Labs/tests ordered:  BMP and CBC and PT/INR tomorrow   Total time spent in this patient care encounter was  45_  minutes; greater than 50% of the visit spent counseling patient daughter and staff, reviewing records , Labs and coordinating care for problems addressed at this encounter.

## 2019-03-20 ENCOUNTER — Encounter (HOSPITAL_COMMUNITY)
Admission: RE | Admit: 2019-03-20 | Discharge: 2019-03-20 | Disposition: A | Discharge status: Home / Self Care | Payer: Medicare Other | Source: Skilled Nursing Facility | Attending: *Deleted | Admitting: *Deleted

## 2019-03-20 LAB — CBC WITH DIFFERENTIAL/PLATELET
Abs Immature Granulocytes: 0.03 10*3/uL (ref 0.00–0.07)
Basophils Absolute: 0 10*3/uL (ref 0.0–0.1)
Basophils Relative: 0 %
Eosinophils Absolute: 0.2 10*3/uL (ref 0.0–0.5)
Eosinophils Relative: 2 %
HCT: 40.6 % (ref 39.0–52.0)
Hemoglobin: 12.3 g/dL — ABNORMAL LOW (ref 13.0–17.0)
Immature Granulocytes: 0 %
Lymphocytes Relative: 17 %
Lymphs Abs: 1.5 10*3/uL (ref 0.7–4.0)
MCH: 27.5 pg (ref 26.0–34.0)
MCHC: 30.3 g/dL (ref 30.0–36.0)
MCV: 90.6 fL (ref 80.0–100.0)
Monocytes Absolute: 0.7 10*3/uL (ref 0.1–1.0)
Monocytes Relative: 8 %
Neutro Abs: 6.6 10*3/uL (ref 1.7–7.7)
Neutrophils Relative %: 73 %
Platelets: 222 10*3/uL (ref 150–400)
RBC: 4.48 MIL/uL (ref 4.22–5.81)
RDW: 16.9 % — ABNORMAL HIGH (ref 11.5–15.5)
WBC: 9.1 10*3/uL (ref 4.0–10.5)
nRBC: 0 % (ref 0.0–0.2)

## 2019-03-20 LAB — PROTIME-INR
INR: 2.2 — ABNORMAL HIGH (ref 0.8–1.2)
Prothrombin Time: 24.5 seconds — ABNORMAL HIGH (ref 11.4–15.2)

## 2019-03-20 LAB — COMPREHENSIVE METABOLIC PANEL
ALT: 51 U/L — ABNORMAL HIGH (ref 0–44)
AST: 37 U/L (ref 15–41)
Albumin: 3.2 g/dL — ABNORMAL LOW (ref 3.5–5.0)
Alkaline Phosphatase: 117 U/L (ref 38–126)
Anion gap: 10 (ref 5–15)
BUN: 37 mg/dL — ABNORMAL HIGH (ref 8–23)
CO2: 29 mmol/L (ref 22–32)
Calcium: 9.4 mg/dL (ref 8.9–10.3)
Chloride: 99 mmol/L (ref 98–111)
Creatinine, Ser: 0.92 mg/dL (ref 0.61–1.24)
GFR calc Af Amer: >60 mL/min (ref >60)
GFR calc non Af Amer: >60 mL/min (ref >60)
Glucose, Bld: 135 mg/dL — ABNORMAL HIGH (ref 70–99)
Potassium: 4.4 mmol/L (ref 3.5–5.1)
Sodium: 138 mmol/L (ref 135–145)
Total Bilirubin: 0.4 mg/dL (ref 0.3–1.2)
Total Protein: 7.1 g/dL (ref 6.5–8.1)

## 2019-03-24 ENCOUNTER — Other Ambulatory Visit: Payer: Self-pay | Admitting: Adult Health

## 2019-03-24 ENCOUNTER — Encounter: Payer: Self-pay | Admitting: Adult Health

## 2019-03-24 ENCOUNTER — Non-Acute Institutional Stay (SKILLED_NURSING_FACILITY): Payer: Medicare Other | Admitting: Adult Health

## 2019-03-24 DIAGNOSIS — I25709 Atherosclerosis of coronary artery bypass graft(s), unspecified, with unspecified angina pectoris: Secondary | ICD-10-CM

## 2019-03-24 DIAGNOSIS — J8410 Pulmonary fibrosis, unspecified: Secondary | ICD-10-CM | POA: Diagnosis not present

## 2019-03-24 DIAGNOSIS — I5032 Chronic diastolic (congestive) heart failure: Secondary | ICD-10-CM

## 2019-03-24 DIAGNOSIS — R627 Adult failure to thrive: Secondary | ICD-10-CM

## 2019-03-24 MED ORDER — ALPRAZOLAM 0.25 MG PO TABS: 0.1250 mg | ORAL_TABLET | Freq: Two times a day (BID) | ORAL | 0 refills | Status: AC

## 2019-03-24 NOTE — Progress Notes (Signed)
Location:   [facility] Room Number: 128 P Place of Service:  SNF (31)    CODE STATUS: DNR  Allergies  Allergen Reactions  . Sulfamethoxazole     Other reaction(s): Red Man Syndrome (ALLERGY)  . Sulfonamide Derivatives     Patient says his wife says he's allergic to sulfa.    Chief Complaint  Patient presents with  . Discharge Note    Discharging to Hospice home today    HPI:   He continues to decline. I have stopped his marinol; vit d; vit b and protonix. He is not eating and drinking very little. There are no reports of fevers; no reports of uncontrolled pain. He did tell me that he feels anxious all the time. I did restart his xanax at a low dose.  His family has decided to have him go to the hospice house for end of life care. He will not need any dme or medications. He will follow medically with the hospice team.    Past Medical History:  Diagnosis Date  . Atrial fibrillation ([place])   . Cancer ([place])   . Coronary atherosclerosis of native coronary artery    Prior stenting of LAD and diagonal at [facility] with subsequent SVG to diagonal, LVEF 55-60%  . Dysphagia   . GERD   . Hiccups   . History of prostate cancer   . Mixed hyperlipidemia   . Weight loss     Past Surgical History:  Procedure Laterality Date  . Bilateral inguinal hernia repair    . bulging disc    . COLONOSCOPY  06/2010  . CORONARY ARTERY BYPASS GRAFT  1996    Emergent SVG to diagonal at [facility]  . ESOPHAGEAL DILATION N/A 06/10/2015   Procedure: ESOPHAGEAL DILATION;  Surgeon: Rogene Houston, MD;  Location: [facility];  Service: Endoscopy;  Laterality: N/A;  . ESOPHAGOGASTRODUODENOSCOPY     w/esophageal dilation   . ESOPHAGOGASTRODUODENOSCOPY N/A 06/10/2015   Procedure: ESOPHAGOGASTRODUODENOSCOPY (EGD);  Surgeon: Rogene Houston, MD;  Location: [facility];  Service: Endoscopy;  Laterality: N/A;  245  . IR KYPHO LUMBAR INC FX REDUCE BONE BX UNI/BIL CANNULATION INC/IMAGING   03/07/2017  . IR RADIOLOGIST EVAL & MGMT  02/28/2017  . UPPER GASTROINTESTINAL ENDOSCOPY  06/2010    Social History   Socioeconomic History  . Marital status: Married    Spouse name: Not on file  . Number of children: Not on file  . Years of education: Not on file  . Highest education level: Not on file  Occupational History  . Not on file  Social Needs  . Financial resource strain: Patient refused  . Food insecurity:    Worry: Patient refused    Inability: Patient refused  . Transportation needs:    Medical: Patient refused    Non-medical: Patient refused  Tobacco Use  . Smoking status: Never Smoker  . Smokeless tobacco: Never Used  Substance and Sexual Activity  . Alcohol use: No    Alcohol/week: 0.0 standard drinks  . Drug use: No  . Sexual activity: Not on file  Lifestyle  . Physical activity:    Days per week: Patient refused    Minutes per session: Patient refused  . Stress: Patient refused  Relationships  . Social connections:    Talks on phone: Patient refused    Gets together: Patient refused    Attends religious service: Patient refused    Active member of club or organization:  Patient refused    Attends meetings of clubs or organizations: Patient refused    Relationship status: Patient refused  . Intimate partner violence:    Fear of current or ex partner: Patient refused    Emotionally abused: Patient refused    Physically abused: Patient refused    Forced sexual activity: Patient refused  Other Topics Concern  . Not on file  Social History Narrative   [name]. Has 1 child from 1st marriage and 2 step children from second.   Family History  Problem Relation Age of Onset  . Coronary artery disease Father     VITAL SIGNS BP 108/67   Pulse 99   Temp (!) 97.5 F (36.4 C)   Resp 20   Ht '5\' 10"'$  (1.778 m)   Wt 143 lb 12.8 oz (65.2 kg)   BMI 20.63 kg/m   Patient's Medications  New Prescriptions   No medications on file   Previous Medications   ACETAMINOPHEN (TYLENOL) 325 MG TABLET    Take 2 tablets (650 mg total) by mouth every 6 (six) hours as needed for mild pain (or Fever >/= 101).   ALPRAZOLAM (XANAX) 0.25 MG TABLET    Take 0.5 tablets (0.125 mg total) by mouth 2 (two) times daily.   BALSAM PERU-CASTOR OIL (VENELEX) OINT    Apply externally to sacrum and bilateral buttocks every shift and as needed   CHOLECALCIFEROL (VITAMIN D) 1000 UNITS TABLET    Take 1,000 Units by mouth daily.   CYANOCOBALAMIN 500 MCG TABLET    Take 500 mcg by mouth daily.   DRONABINOL (MARINOL) 2.5 MG CAPSULE    Take 1 capsule (2.5 mg total) by mouth daily before lunch.   GUAIFENESIN (ROBITUSSIN) 100 MG/5ML SOLN    Take 5 mLs by mouth 4 (four) times daily. Eery 4 hours during waking hours   IPRATROPIUM (ATROVENT) 0.03 % NASAL SPRAY    Place 2 sprays into both nostrils. Daily before meals   MULTIPLE VITAMIN (MULTIVITAMIN WITH MINERALS) TABS TABLET    Take 1 tablet by mouth daily.   NITROGLYCERIN (NITROSTAT) 0.4 MG SL TABLET    Place 1 tablet (0.4 mg total) under the tongue every 5 (five) minutes as needed.   NON FORMULARY    Diet Type:  Mechanical soft   NUTRITIONAL SUPPLEMENTS (ENSURE ENLIVE PO)    Take 237 mLs by mouth 3 (three) times daily before meals. And at bedtime   OSTOMY SUPPLIES (SKIN PREP WIPES) MISC    Apply to bilateral heels every shift for prevention   OXYGEN    Inhale 2 L/min into the lungs as needed. For SOB, dyspnea or sats < 90   PANTOPRAZOLE (PROTONIX) 40 MG TABLET    Take 1 tablet (40 mg total) by mouth daily before breakfast.   PROPYLENE GLYCOL (SYSTANE BALANCE OP)    Place 2 drops into both eyes daily as needed (for dry eye relief).    WARFARIN (COUMADIN) 3 MG TABLET    Take 3 mg by mouth every evening.   Modified Medications   No medications on file  Discontinued Medications   No medications on file     SIGNIFICANT DIAGNOSTIC EXAMS  PREVIOUS;   02-23-19: chest x-ray: Congestive heart failure superimposed  on interstitial fibrosis, and right greater than left pleural effusion. Surgical changes of median sternotomy and CABG  02-24-19: 2-d echo:   1. The left ventricle has normal systolic function, with an ejection fraction of 55-60%. There is  anterosepatal hypokinesis. The cavity size was normal. There is mildly increased left ventricular wall thickness. Left ventricular diastolic Doppler parameters are indeterminate. There is right ventricular volume overload.  2. The right ventricle has normal systolic function. The cavity was normal. There is no increase in right ventricular wall thickness. Right ventricular systolic pressure is moderately elevated with an estimated pressure of 52.4 mmHg.  3. Left atrial size was severely dilated.  4. Right atrial size was severely dilated.  5. The aortic valve is tricuspid. Mild calcification of the aortic valve. Aortic valve regurgitation is mild by color flow Doppler. Moderate aortic annular calcification noted.  6. The mitral valve is grossly normal. Mild thickening of the mitral valve leaflet. There is mild mitral annular calcification present.  7. The tricuspid valve is grossly normal. There is mild tricuspid regurgitation.  8. The aortic root is normal in size and structure.  9. The inferior vena cava was dilated in size with >50% respiratory variability.  03-05-19: chest x-ray: Improved aeration since 02/23/2019. Cardiomegaly with small pleural effusions or thickening, but decreased interstitial edema. There is underlying chronic interstitial lung disease.  NO NEW EXAMS.   LABS REVIEWED PREVIOUS:   04-12-18: chol 130; ldl 75 trig 121; hdl 31  02-23-19: wbc 6.9; hgb 10.8; hct 34.8; mcv 89.7; plt 254; glucose 126; bun 23; creat 1.20 ;k+ 5.0; na++ 137; ca 8.9; mag 1.7 BNP 225.0; INR 2.4 02-24-19: tsh 1.106 dig 0.7  02-25-19: wbc 7.7; hgb 10.5; hct 34.5; mcv 90.6 plt 282; glucose 111; bun 24; creat 1.31; k+ 4.2; na++ 138; ca 9.0;  02-26-19: INR 2.4 03-02-19:  glucose 113; bun 24; creat 1.15; k+ 4.6; na++ 131; ca 9.2 INR  2.4  03-09-19: wbc 10.8; hgb 12.2; hct 38.6; mcv 86.7 plt 274 glucose 130; bun 41; creat 0.94; k+ 4.8; na++ 130; ca 9.4; liver normal albumin 3.4 INR 2.1  03-14-19: wbc 11.1; hgb 12.8; hct 39.9; mcv 87.3 plt 242  glucose 129; bun 42; creat 1.10 k+ 4.6 na++ 130 ca 9.2; alk phos 134 albumin 3.4  03-15-28: wbc 9.7 hgb 11.7; hct 37.6; mcv 89.3; plt 204; glucose 140; bun 41; creat 1.01; k+ 5.1; na++ 135; ca 9.1 liver normal albumin 3.1  tsh 1.041  NO NEW LABS.   Review of Systems  Constitutional: Positive for malaise/fatigue.       No appetite   Respiratory: Positive for cough. Negative for shortness of breath.   Cardiovascular: Negative for chest pain.  Gastrointestinal: Negative for abdominal pain.  Musculoskeletal: Negative for myalgias.  Skin: Negative.   Neurological: Negative for dizziness.  Psychiatric/Behavioral: The patient is nervous/anxious.    Physical Exam Constitutional:      General: He is not in acute distress.    Appearance: He is cachectic. He is ill-appearing. He is not diaphoretic.  Eyes:     Comments: History of bilateral cataract removal and lens implant     Neck:     Thyroid: No thyromegaly.  Cardiovascular:     Rate and Rhythm: Normal rate. Rhythm irregular.     Heart sounds: Normal heart sounds.     Comments: History of CABG Pulmonary:     Effort: Pulmonary effort is normal. No respiratory distress.     Breath sounds: Normal breath sounds.     Comments: 02 dependent  Abdominal:     General: Bowel sounds are normal. There is no distension.     Palpations: Abdomen is soft.     Tenderness: There is no abdominal  tenderness.  Musculoskeletal:     Right lower leg: No edema.     Left lower leg: No edema.     Comments:  Slight kyphosis  History of lumbar surgery     Is able to move all extremities    Lymphadenopathy:     Cervical: No cervical adenopathy.  Skin:    General: Skin is warm and dry.   Neurological:     Mental Status: He is alert and oriented to person, place, and time.  Psychiatric:        Mood and Affect: Mood normal.       ASSESSMENT/ PLAN:    Patient is being discharged with the following home health services:  None needed   Patient is being discharged with the following durable medical equipment:  None needed   Patient has been advised to f/u with their PCP in 1-2 weeks to bring them up to date on their rehab stay.  Social services at facility was responsible for arranging this appointment.  Pt was provided with a 30 day supply of prescriptions for medications and refills must be obtained from their PCP.  For controlled substances, a more limited supply may be provided adequate until PCP appointment only.   His medications will be provided at the hospice house  Time spent with patient and discharge process: 35 minutes: medication changes; disposition.    Ok Edwards NP [facility]  Contact [phone] Monday through Friday 8am- 5pm  After hours call [phone]

## 2019-04-03 ENCOUNTER — Encounter (HOSPITAL_COMMUNITY)
Admission: RE | Admit: 2019-04-03 | Discharge: 2019-04-03 | Disposition: A | Discharge status: Home / Self Care | Payer: Medicare Other | Source: Skilled Nursing Facility | Attending: Internal Medicine | Admitting: Internal Medicine

## 2019-04-03 DIAGNOSIS — I5031 Acute diastolic (congestive) heart failure: Secondary | ICD-10-CM | POA: Insufficient documentation

## 2019-04-03 DIAGNOSIS — D649 Anemia, unspecified: Secondary | ICD-10-CM | POA: Insufficient documentation

## 2019-04-03 DIAGNOSIS — J8410 Pulmonary fibrosis, unspecified: Secondary | ICD-10-CM | POA: Insufficient documentation

## 2019-04-03 DIAGNOSIS — Z7901 Long term (current) use of anticoagulants: Secondary | ICD-10-CM | POA: Insufficient documentation

## 2019-04-03 DIAGNOSIS — A0472 Enterocolitis due to Clostridium difficile, not specified as recurrent: Secondary | ICD-10-CM | POA: Insufficient documentation

## 2019-04-03 DIAGNOSIS — R627 Adult failure to thrive: Secondary | ICD-10-CM | POA: Insufficient documentation

## 2019-04-29 DEATH — deceased

## 2019-05-05 ENCOUNTER — Ambulatory Visit: Payer: Medicare Other | Admitting: Cardiology

## 2020-05-24 IMAGING — DX CHEST - 2 VIEW
2 series · 2 of 2 positions shown · non-contrast
Comparison: 08/24/2017, CT 08/13/2018

CLINICAL DATA: 89-year-old male with weakness and inability to walk

EXAM:
CHEST - 2 VIEW

[chest lat]
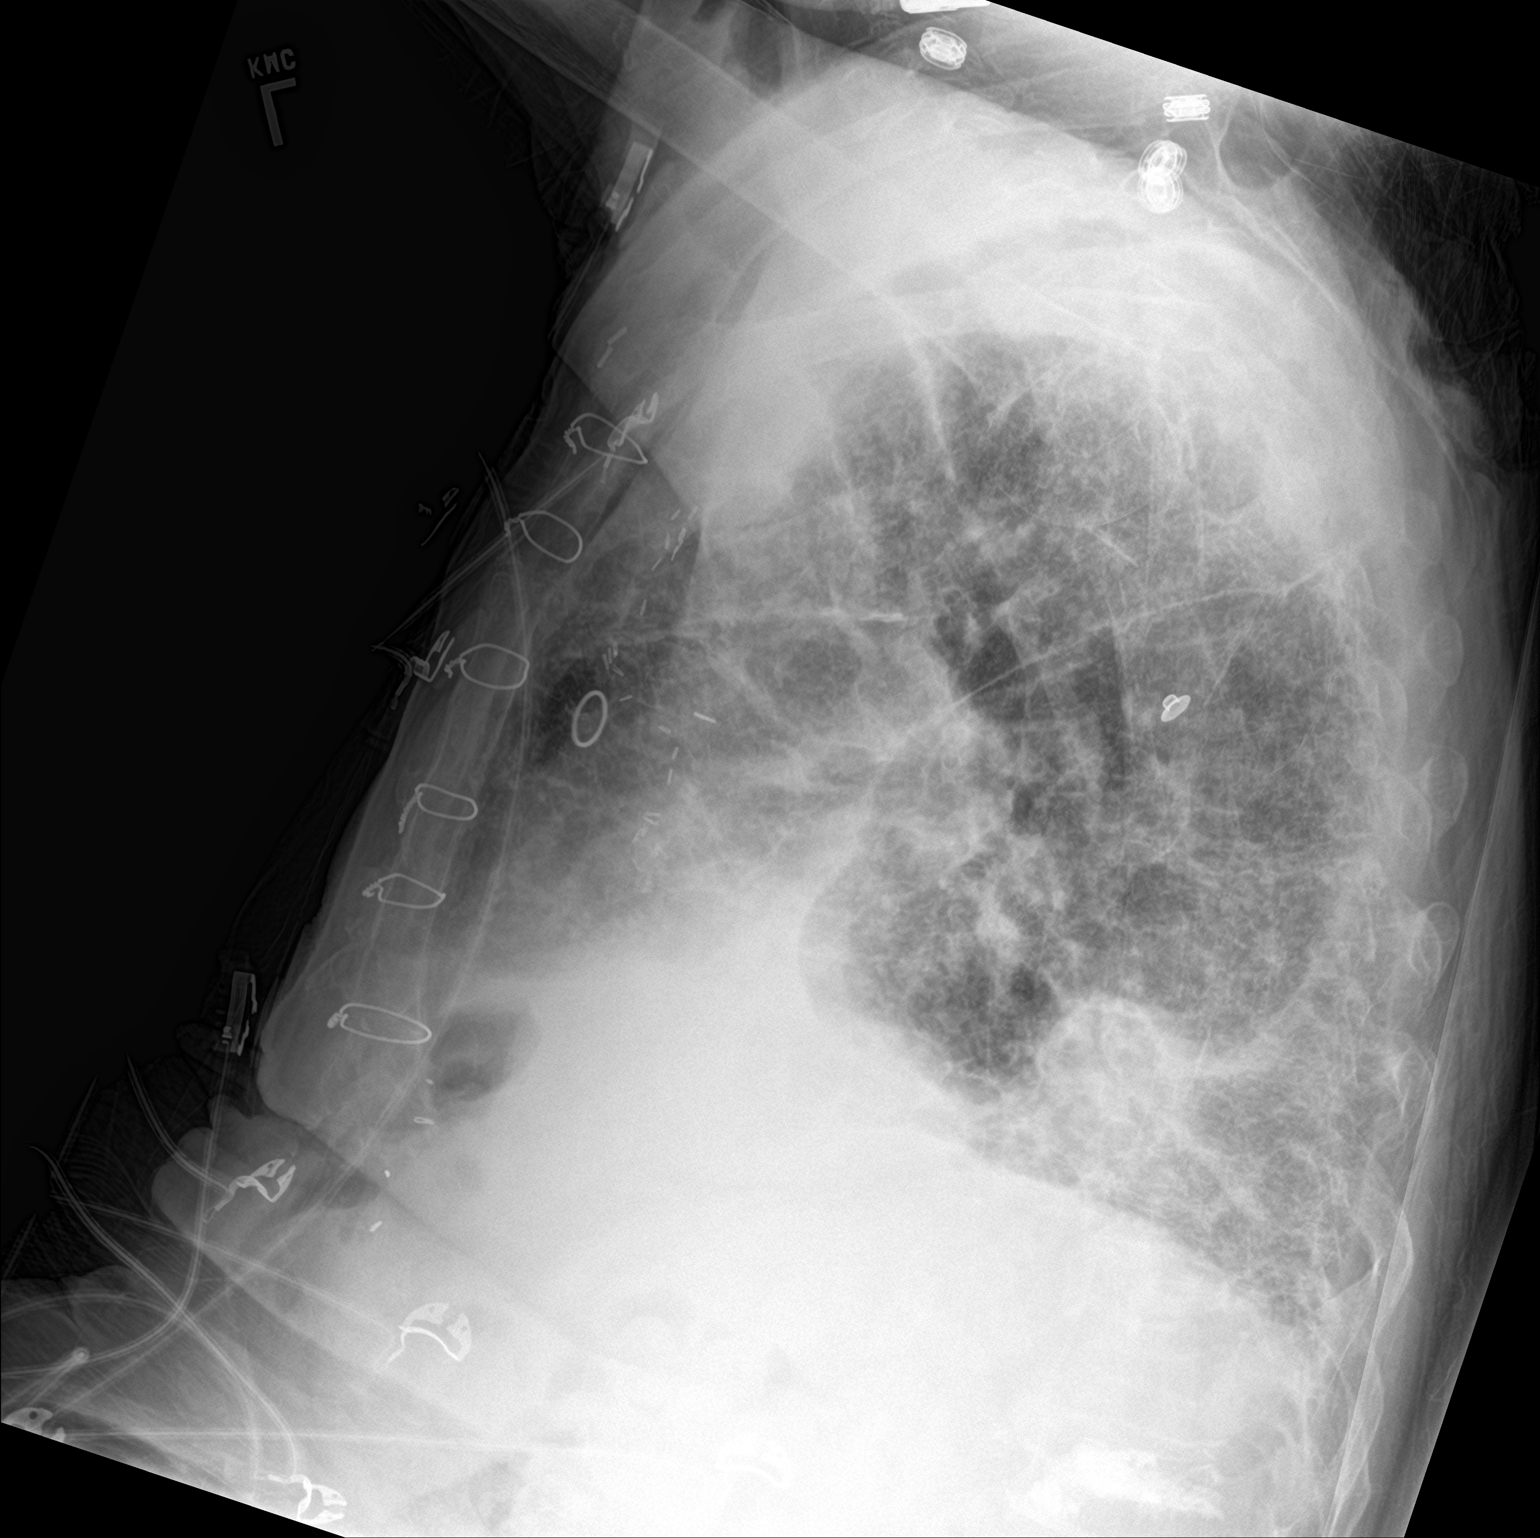

[chest ap]
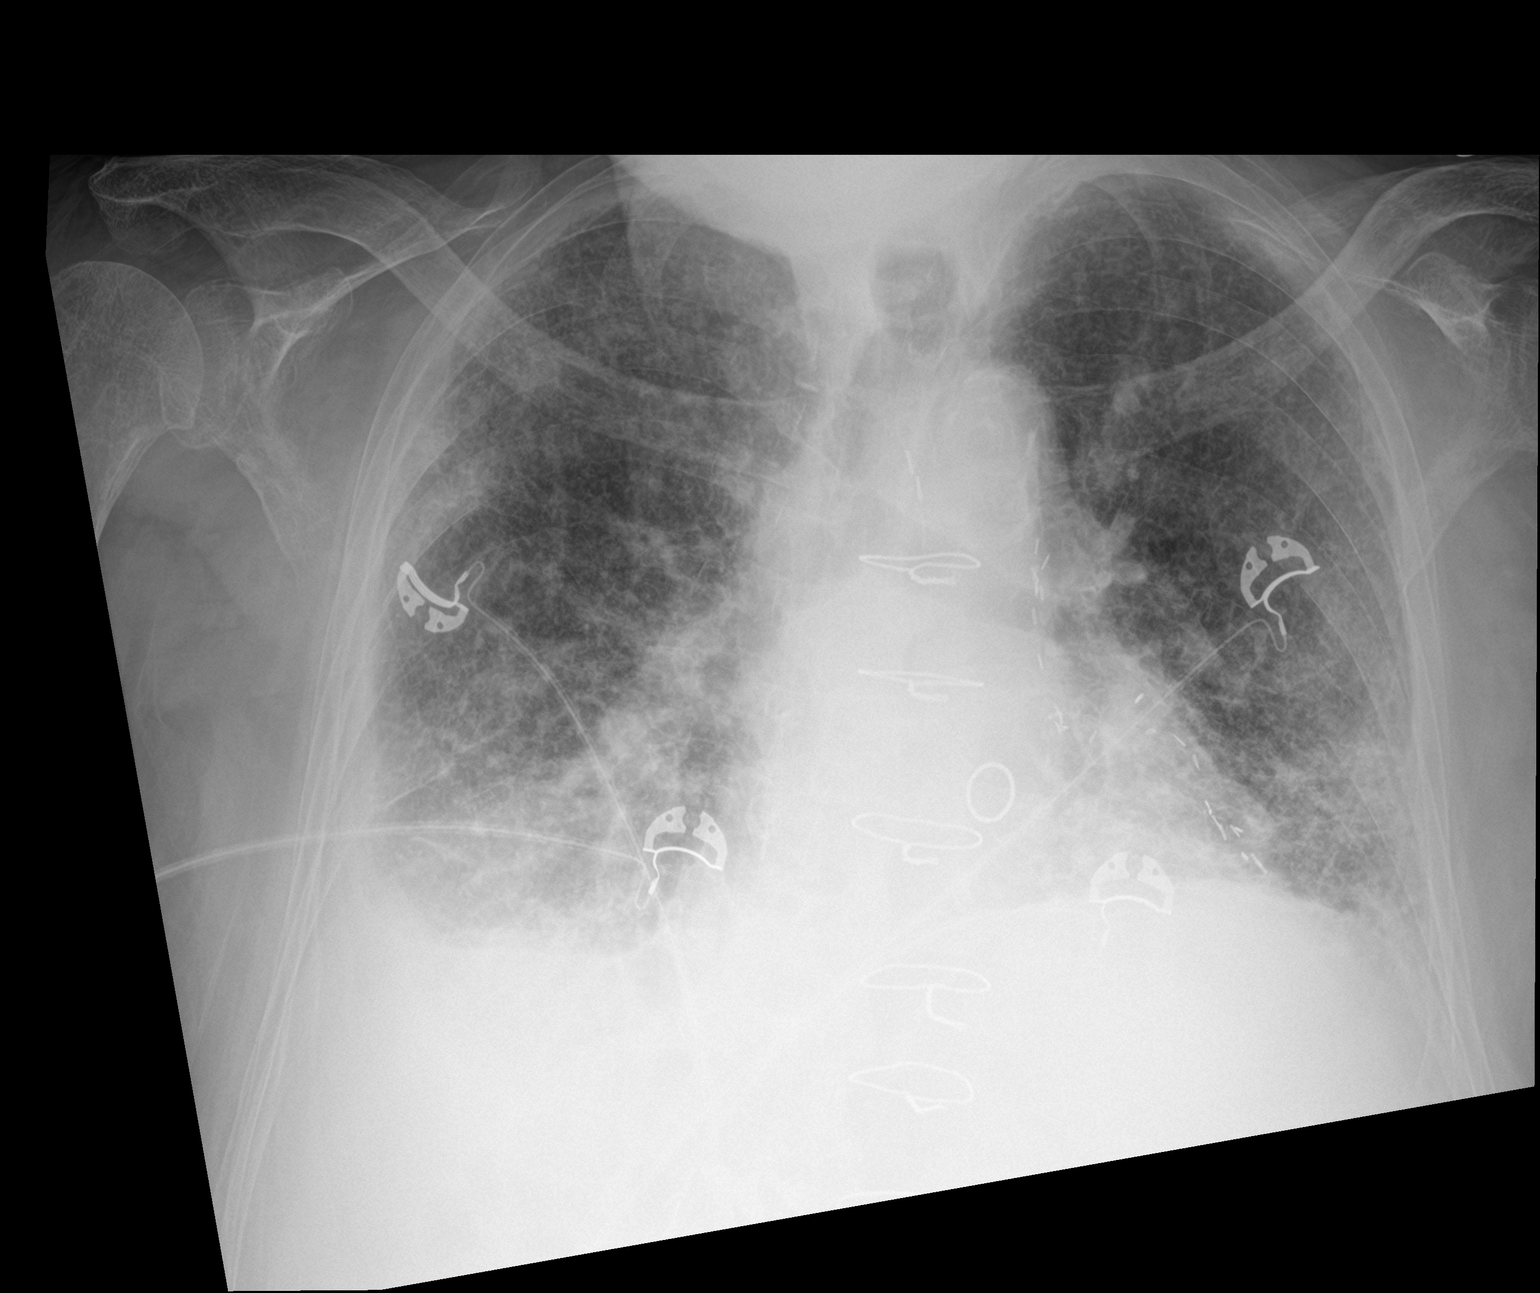

[2 of 2 positions shown; findings below may reference images not displayed]

FINDINGS: Cardiomediastinal silhouette unchanged in size and contour. Surgical
changes of median sternotomy and CABG. Reticular pattern of opacity
of the bilateral lungs, worsening since the prior. Interlobular
septal thickening.

Opacities at the lung bases with blunting of the bilateral
costophrenic angles, worse on the right. Meniscus on the lateral
view.
IMPRESSION: Congestive heart failure superimposed on interstitial fibrosis, and
right greater than left pleural effusion.

Surgical changes of median sternotomy and CABG
